# Patient Record
Sex: Female | Born: 1960
Health system: Southern US, Community
[De-identification: ages and names within clinical notes are randomized; demographics above are authoritative.]

## PROBLEM LIST (undated history)

## (undated) DIAGNOSIS — I10 Essential (primary) hypertension: Secondary | ICD-10-CM

## (undated) DIAGNOSIS — M810 Age-related osteoporosis without current pathological fracture: Secondary | ICD-10-CM

## (undated) DIAGNOSIS — T7840XA Allergy, unspecified, initial encounter: Secondary | ICD-10-CM

## (undated) DIAGNOSIS — E871 Hypo-osmolality and hyponatremia: Secondary | ICD-10-CM

## (undated) DIAGNOSIS — R0600 Dyspnea, unspecified: Secondary | ICD-10-CM

## (undated) DIAGNOSIS — I517 Cardiomegaly: Secondary | ICD-10-CM

## (undated) DIAGNOSIS — Z9889 Other specified postprocedural states: Secondary | ICD-10-CM

## (undated) DIAGNOSIS — Z720 Tobacco use: Secondary | ICD-10-CM

## (undated) DIAGNOSIS — R7303 Prediabetes: Secondary | ICD-10-CM

## (undated) DIAGNOSIS — R112 Nausea with vomiting, unspecified: Secondary | ICD-10-CM

## (undated) DIAGNOSIS — M1712 Unilateral primary osteoarthritis, left knee: Secondary | ICD-10-CM

## (undated) DIAGNOSIS — I341 Nonrheumatic mitral (valve) prolapse: Secondary | ICD-10-CM

## (undated) DIAGNOSIS — T8859XA Other complications of anesthesia, initial encounter: Secondary | ICD-10-CM

## (undated) DIAGNOSIS — T4145XA Adverse effect of unspecified anesthetic, initial encounter: Secondary | ICD-10-CM

## (undated) DIAGNOSIS — M1990 Unspecified osteoarthritis, unspecified site: Secondary | ICD-10-CM

## (undated) HISTORY — DX: Allergy, unspecified, initial encounter: T78.40XA

## (undated) HISTORY — DX: Hypo-osmolality and hyponatremia: E87.1

## (undated) HISTORY — DX: Age-related osteoporosis without current pathological fracture: M81.0

## (undated) HISTORY — DX: Cardiomegaly: I51.7

## (undated) HISTORY — DX: Unilateral primary osteoarthritis, left knee: M17.12

## (undated) HISTORY — PX: APPENDECTOMY: SHX54

## (undated) HISTORY — PX: KNEE ARTHROSCOPY: SUR90

## (undated) HISTORY — PX: SHOULDER ARTHROSCOPY: SHX128

---
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
## 1992-12-09 HISTORY — PX: ABDOMINAL HYSTERECTOMY: SHX81

## 2002-02-10 ENCOUNTER — Emergency Department (HOSPITAL_COMMUNITY): Admission: EM | Admit: 2002-02-10 | Discharge: 2002-02-10 | Payer: Self-pay | Admitting: Emergency Medicine

## 2002-02-11 ENCOUNTER — Encounter: Payer: Self-pay | Admitting: Emergency Medicine

## 2002-03-01 ENCOUNTER — Other Ambulatory Visit: Admission: RE | Admit: 2002-03-01 | Discharge: 2002-03-01 | Payer: Self-pay | Admitting: *Deleted

## 2004-06-18 ENCOUNTER — Ambulatory Visit (HOSPITAL_BASED_OUTPATIENT_CLINIC_OR_DEPARTMENT_OTHER): Admission: RE | Admit: 2004-06-18 | Discharge: 2004-06-18 | Payer: Self-pay | Admitting: Orthopedic Surgery

## 2006-10-01 ENCOUNTER — Other Ambulatory Visit: Admission: RE | Admit: 2006-10-01 | Discharge: 2006-10-01 | Payer: Self-pay | Admitting: *Deleted

## 2006-12-09 HISTORY — PX: CERVICAL DISCECTOMY: SHX98

## 2007-03-02 ENCOUNTER — Encounter: Admission: RE | Admit: 2007-03-02 | Discharge: 2007-03-02 | Payer: Self-pay | Admitting: Family Medicine

## 2007-03-24 ENCOUNTER — Inpatient Hospital Stay (HOSPITAL_COMMUNITY): Admission: RE | Admit: 2007-03-24 | Discharge: 2007-03-25 | Payer: Self-pay | Admitting: Orthopedic Surgery

## 2007-03-24 ENCOUNTER — Encounter (INDEPENDENT_AMBULATORY_CARE_PROVIDER_SITE_OTHER): Payer: Self-pay | Admitting: Specialist

## 2008-06-08 ENCOUNTER — Ambulatory Visit (HOSPITAL_BASED_OUTPATIENT_CLINIC_OR_DEPARTMENT_OTHER): Admission: RE | Admit: 2008-06-08 | Discharge: 2008-06-08 | Payer: Self-pay | Admitting: Orthopedic Surgery

## 2010-07-04 ENCOUNTER — Other Ambulatory Visit: Admission: RE | Admit: 2010-07-04 | Discharge: 2010-07-04 | Payer: Self-pay | Admitting: Family Medicine

## 2010-07-20 ENCOUNTER — Encounter: Admission: RE | Admit: 2010-07-20 | Discharge: 2010-07-20 | Payer: Self-pay | Admitting: Family Medicine

## 2011-04-23 NOTE — Op Note (Signed)
NAME:  Jordan [name], SULLENGER NO.:  [id number]   MEDICAL RECORD NO.:  [id number]          PATIENT TYPE:  AMB   LOCATION:  DSC                          FACILITY:  [facility]   PHYSICIAN:  Feliberto Gottron. Turner Daniels, M.D.   DATE OF BIRTH:  09/08/1961   DATE OF PROCEDURE:  06/08/2008  DATE OF DISCHARGE:                               OPERATIVE REPORT   PREOPERATIVE DIAGNOSIS:  Right knee medial meniscal tear versus loose  bodies.   POSTOPERATIVE DIAGNOSES:  Right knee intra-articular loose bodies,  chondromalacia focal grade 4 to the medial femoral condyle, medial  tibial plateau with more generalized grade 3 chondromalacia.   PROCEDURE:  Right knee arthroscopic debridement of chondromalacia and  intra-articular loose bodies.   SURGEON:  Feliberto Gottron.  Turner Daniels, MD   FIRST ASSISTANT:  Shirl Harris, PA.   ANESTHETIC:  General LMA.   ESTIMATED BLOOD LOSS:  Minimal.   FLUID REPLACEMENT:  800 mL of crystalloid.   DRAINS PLACED:  None.   TOURNIQUET TIME:  None.   INDICATIONS FOR PROCEDURE:  A 50 year old woman who has had previous  arthroscopy of her right knee by me in 2007, with recurrent catching,  popping and pain.  In the past, she has not done well with cortisone  injections.  She did desire another one.  Plain radiographs show good  maintenance of articular cartilage height and she does have mechanical  catching and popping on physical examination.  She desires elective  arthroscopic evaluation and treatment of her right knee with a presumed  diagnosis of recurrent chondromalacia, possible loose bodies, possible  medial meniscal tear.  She says the pain on this presentation was worse  than it was 2 years ago.  Risks and benefits of surgery discussed,  questions answered.   DESCRIPTION OF PROCEDURE:  The patient identified by armband, taken to  the operating room at [facility].  Appropriate anesthetic  monitors were attached and general LMA anesthesia induced with  the  patient in supine position.  Lateral post applied to the table and the  right lower extremity prepped and draped in usual sterile fashion from  the ankle to the midthigh.  We began the procedure by making standard  inferomedial, and inferolateral peripatellar portals allowing  introduction of the arthroscope through the inferolateral portal and the  outflow through the inferomedial portal.  Diagnostic arthroscopy  revealed a normal patellofemoral joint, but there were multiple  cartilaginous loose body seen floating around in the joint fluid that  were taken through the outflow.  Moving to the medial side of the joint  we identified an area of grade 3 to grade 4, focal chondromalacia of the  medial femoral condyle distally as well as the medial tibial plateau  near the mid meniscal inner rim.  Both these areas were about 2 mm in  width and 5-6 mm in length.  These were debrided back to a stable  margin.  The medial meniscus was probed and found to be intact.  The  cruciate ligaments were intact.  On the lateral side the articular and  meniscal cartilages  were in good shape except for some very shallow  cracks in the lateral tibial plateau that were not fibrillated.  These  were left alone.  The gutters were cleared medially and laterally and  there a few more small loose bodies noted.  These were taken through the  outflow.  At this point, the knee was irrigated out with normal saline  solution.  The arthroscopic consents removed and a dressing of Xeroform,  4x4 dressing, sponges, Webril and Ace wrap applied.      Feliberto Gottron. Turner Daniels, M.D.  Electronically Signed     FJR/MEDQ  D:  06/08/2008  T:  06/09/2008  Job:  [id number]

## 2011-04-26 NOTE — Discharge Summary (Signed)
NAME:  Jordan [name], Jordan [name] NO.:  [id number]   MEDICAL RECORD NO.:  [id number]          PATIENT TYPE:  INP   LOCATION:  5006                         FACILITY:  [facility]   PHYSICIAN:  Nelda Severe, MD      DATE OF BIRTH:  06-20-1961   DATE OF ADMISSION:  03/24/2007  DATE OF DISCHARGE:  03/25/2007                               DISCHARGE SUMMARY   This woman was admitted for management of severe right shoulder pain and  right brachialgia secondary to a right C5-6 disk herniation.  The  patient was taken to the operating room where anterior disk excision and  fusion was carried out without complication.   Today, the first postoperative day, she is free of arm pain.  She still  has some tingling in the thumb and index finger of the right hand which  I have reassured her is fairly usual.  Penrose drain was removed from  the wound this morning and the incision was satisfactory.  She is having  a little pain anteriorly at crest harvest site on the left side which is  slowing up her walking.  She has been provided with an Aspen collar this  morning and I have shown her how to place it with her husband.   She is being given a prescription for Norco 10, 75 tablets, one q.4h.  p.r.n. and she has some Percocet remaining at home, given to her by  another doctor.  She will also continue to take Citracal and a  multivitamin.   She may remove her dressings and shower beginning tomorrow.  She is to  avoid lifting and bending.  She is to remain on a soft diet for at least  1 week.  She will wear her Aspen collar when she is out of the house.  She will return to see me in the office in 2 week's time.   FINAL DIAGNOSIS:  C5-6 disk herniation.   See above for discharge instructions, medication and followup  arrangements.      Nelda Severe, MD  Electronically Signed     MT/MEDQ  D:  03/25/2007  T:  03/25/2007  Job:  [id number]

## 2011-04-26 NOTE — H&P (Signed)
NAME:  Jordan [name], Jordan [name] NO.:  [id number]   MEDICAL RECORD NO.:  [id number]          PATIENT TYPE:  INP   LOCATION:  NA                           FACILITY:  [facility]   PHYSICIAN:  Nelda Severe, MD      DATE OF BIRTH:  1961/04/16   DATE OF ADMISSION:  DATE OF DISCHARGE:                              HISTORY & PHYSICAL   CHIEF COMPLAINT:  Constant shoulder pain, arm pain on the right with  right-sided numbness and tingling, as well as cervical neck pain in the  area of C5-6 secondary to herniated disk.   DRUG ALLERGIES:  Include NAPROSYN and GUAIFENESIN.   MEDICATIONS:  Today include:  1. Oxycodone.  2. Percocet 5/325 one every 4 to 6 p.r.n. for pain control.   PAST SURGICAL HISTORY:  Includes:  1. Right knee arthroscopy in 2007.  2. Left shoulder arthroscopy in 2003.  3. Hysterectomy in 1989.  4. Appendectomy in 1978.   FAMILY HISTORY:  Includes:  1. Coronary artery disease.  2. Diabetes.   REVIEW OF SYSTEMS:  She does not report change in her vision or hearing.  No chronic cough.  No shortness of breath.  No fever, no chills.  No  chest pain.  No nausea, vomiting, diarrhea.  No bowel or bladder  incontinence.  No abnormal gait.  No headaches or seizures.  No  depression.   PHYSICAL EXAMINATION:  VITAL SIGNS:  Today, her temperature is 97.3,  pulse is 102, respirations 22, blood pressure 137/96.  She is well-  developed, well-nourished.  Normocephalic in appearance.  Pupils are  equal, round, react to light.  NECK:  Supple.  No adenopathy.  She does have pain with range of motion.  She has positive foraminal compression test on the right.  CHEST:  Clear to auscultation.  No wheezes noted.  HEART:  Regular rate and rhythm.  No murmur noted.  ABDOMEN:  Soft, nontender to palpation.  Positive bowel sounds  auscultated.  UPPER EXTREMITY EXAM:  She has a positive foraminal compression test on  the right.  She has decreased sensation and numbness and tingling on  the  right upper extremity.  Depressed reflexes, right biceps 1/4 compared to  2/4 on the left.  SKIN:  Clean, dry, and intact.   MRI was reviewed.  It shows a C5-6 herniated disk to the right.   PLAN:  Anterior fusion C5-6 with iliac crest bone graft by Dr. Nelda Severe.      Lianne Cure, P.A.      Nelda Severe, MD  Electronically Signed    MC/MEDQ  D:  03/20/2007  T:  03/20/2007  Job:  [id number]

## 2011-04-26 NOTE — Op Note (Signed)
NAME:  Jordan, [name] NO.:  [id number]   MEDICAL RECORD NO.:  [id number]          PATIENT TYPE:  INP   LOCATION:  2899                         FACILITY:  [facility]   PHYSICIAN:  Nelda Severe, MD      DATE OF BIRTH:  12/02/1961   DATE OF PROCEDURE:  03/24/2007  DATE OF DISCHARGE:                               OPERATIVE REPORT   SURGEON:  Dr. Nelda Severe.   ASSISTANT:  Lianne Cure, PA-C   PREOPERATIVE DIAGNOSIS:  C5-6 foraminal stenosis and disk herniation  right side.   POSTOPERATIVE DIAGNOSIS:  C5-6 foraminal stenosis and disk herniation  right side.   OPERATIVE PROCEDURE:  Anterior C5-6 disk excision, foraminotomy and  fusion with left iliac crest autograft, interbody cage (Synthes)  anterior cervical plate (Synthes).   INDICATIONS FOR SURGERY:  Severe persistent right brachialgia  unresponsive to nonoperative measures.   PROCEDURE:  The patient was placed under general endotracheal  anesthesia.  Antibiotics were administered intravenously  prophylactically preop.  She was positioned supine on operating table  with head supported on a horseshoe.  A small foam bump was placed under  the left hip to elevate the anterior iliac crest.  Neck was relatively  extended.  The arms were tucked at the sides.  Prior to prepping, a  cross table lateral x-ray of the neck was taken with a 18 gauge needle  taped to the skin and its location marked with skin marker.  The  radiograph revealed that the needle was at approximately the C4-5 level.  It was removed.  The skin of the anterior cervical spine and left  anterior iliac crest were prepped with DuraPrep.  The operative sites,  anterior cervical spine and left iliac crest were then draped in  rectangular fashion and the drapes secured with Ioban.   A transverse incision, from midline left to the anterior border the  sternocleidomastoid was made about 1 cm below the previously placed  needle.  The subcutaneous  tissue was injected with a mixture of quarter  percent Marcaine with epinephrine 1% lidocaine after scoring the skin  and the line of the incision.  Incision was then carried down through  the platysmas which was cut in line with the incision.   We then carried out blunt dissection the deep cervical fascia,  retracting the carotid sheath laterally, the trachea and esophagus  medial to the right side.  Longus coli muscles were identified.  What  was perceived to be the C4-5 disk, a little proximal to the central part  of our wound was identified and a radiopaque needle marker placed there.  It was placed above the C5-6 disk to make sure that it showed up on the  x-ray.  The next x-ray confirmed our level and the C5-6 at disk space  was accessed.  We mobilized longus, coli muscles bilaterally at the  level of the bodies of C5-6 and the disk space.  A self-retaining shadow  line retractor was placed.  We then placed distraction pins at C5 and C6  and applied the distractor.   The annulus was fenestrated  and then curettes used to free up most of  the nucleus.  This was then delivered using pituitary rongeurs.  The  distraction was increased slightly.  Were then able to access the  posterior aspect of the disk space curetting away annular fibers and  nucleus as well as cervical endplate cartilage.   At the posterolateral area on the right side there was a gossamer thin  layer of what was presumably annulus separating the internal part of the  disk from a small sac extruded fragment of disk which was delivered into  the wound.  We performed a thorough foraminotomy on the right side.   We then removed as much more nucleus as we could from the lateral  portions of the disk.  We meticulously removed more endplate cartilage.  Small amount of anterior spondylophyte was removed using a rongeur.   We then trialed for appropriate size of interbody cage.  I placed a 6 mm  thick cage and let off the  distraction the cage seemed a little loose.  The 7 mm cage seemed a better fit when the distraction was let off.   We then harvested a left anterior iliac crest graft through a short  incision made about 1 cm distal to the iliac crest on the left side.  The external oblique fascia was then elevated off the top of the crest  over about a 2 cm segment.  A small gouge was then used to fenestrate  the top of the crest and harvest an adequate amount of cancellous graft  packed into the cage.  The cage was packed and then inserted with the  vertebra under distraction and then the distraction let off.  The cage  was impacted about 1-2 mm further.   Next we packed in some remaining graft lateral to the cage and  anteriorly, between the cage and the overlying plate.  Cross-table  lateral radiographs were taken showing satisfactory position of screws,  plate and cage.   Care was taken to make sure that all screws were seated in the plate.  These were 14 mm screws.   We then placed a quarter inch Penrose drain in the depths of the wound.  The platysma was closed using interrupted inverted 2-0 Vicryl sutures.  The skin layer was then closed using 3-0 undyed Vicryl in running  subcuticular fashion.  The quarter inch Penrose drain was secured using  one 4-0 nylon suture.   The fascial of the iliac crest harvest site was closed using 0-0 Vicryl  suture.  The subcutaneous tissue was closed using 2-0 Vicryl and the  skin using a running subcuticular 3-0 undyed Vicryl stitch.  Both  incisions were reinforced with Steri-Strips after applying tincture of  Benzoin to the skin.  A antibiotic ointment dressing was applied to both  wounds.  The dressing on the iliac crest was secured with OpSite.  The  dressing on the cervical spine was secured with a surgical towel.   The patient was then awakened and extubated in the operating room.  She was able to move all four extremities in the recovery room, has  good  strength grip on both sides.  She is not complaining of any arm or right  shoulder pain.   Sponge, needle counts were correct.  There were no intraoperative  complications.  Final x-ray showed confirmed proper level and screw  placements etc.      Nelda Severe, MD  Electronically Signed  MT/MEDQ  D:  03/24/2007  T:  03/24/2007  Job:  [id number]

## 2011-04-26 NOTE — Op Note (Signed)
NAME:  Jordan [name], Jordan [name] NO.:  [id number]   MEDICAL RECORD NO.:  [id number]                   PATIENT TYPE:  AMB   LOCATION:  NESC                                 FACILITY:  [facility]   PHYSICIAN:  Feliberto Gottron. Turner Daniels, M.D.                DATE OF BIRTH:  1961-07-23   DATE OF PROCEDURE:  06/18/2004  DATE OF DISCHARGE:                                 OPERATIVE REPORT   PREOPERATIVE DIAGNOSIS:  Left shoulder impingement syndrome.   POSTOPERATIVE DIAGNOSIS:  Left shoulder impingement syndrome.   PROCEDURE:  Left shoulder arthroscopic anterior and inferior acromioplasty.   SURGEON:  Feliberto Gottron. Turner Daniels, M.D.   FIRST ASSISTANT:  Erskine Squibb B. Jannet Mantis.   ANESTHESIA:  General endotracheal.   ESTIMATED BLOOD LOSS:  Minimal.   FLUID REPLACEMENT:  One liter Crystalloid.   DRAINS:  None.   TOURNIQUET TIME:  None.   INDICATIONS FOR PROCEDURE:  A 50 year old woman treated for left shoulder  impingement syndrome for over a year.  Got good temporary relief from a  cortisone injection last year, but it wore off.  She desires to not have any  further cortisone injections.  She has a 6-7 mm type 2 subacromial spur and  classic impingement signs.  She has also failed physical therapy and oral  anti-inflammatory medications.   DESCRIPTION OF PROCEDURE:  The patient identified by arm band and taken to  the operating room at the [facility] outpatient surgery center.  Appropriate anesthetic monitors were attached.  General endotracheal  anesthesia induced, and the patient was then placed in the beach chair  position.  The left upper extremity prepped and draped in the usual sterile  fashion from the wrist to the hemithorax.   The skin along the anterolateral and posterior aspects of the acromion  process was infiltrated with 2-3 cc of 0.5% Marcaine and epinephrine  solution.  Using a  #11 blade, the standard portals were made, 1.5 cm anterior to the [facility] joint  lateral to the  junction of the middle and posterior insert of the acromion,  and posterior to the posterolateral corner of the acromion process.  The  inflow was placed anteriorly, the arthroscope placed laterally, and a 4.2  great white sucker shaver posteriorly -- allowing removal of the inflamed  subacromial bursa, part of the CA ligament insertion and outlining of the  subacromial spur.  This was then removed with a 4.5 hooded vortex bur).  We  evaluated the rotator cuff and found no significant tearing. The arthroscope  was repositioned into the glenohumeral joint itself, using the posterior  portal with diagnostic arthroscopy -- once again, revealed no significant  tears to the rotator cuff.  There is definitely inflammation below where the  spur resided, prior to its resection.  The biceps tendon and labrum were in  good condition, as was the articular cartilage.   The shoulder was then washed  out with normal saline solution.  The  arthroscopic instrument were removed.  A dressing of Xeroform 4 x 4 dressing  sponges, paper tape and a sling applied.  The patient was then laid supine,  awakened and taken to the recovery room without difficulty.                                               Feliberto Gottron. Turner Daniels, M.D.    Ovid Curd  D:  06/18/2004  T:  06/18/2004  Job:  [id number]

## 2011-07-08 ENCOUNTER — Other Ambulatory Visit: Payer: Self-pay | Admitting: Internal Medicine

## 2011-07-08 ENCOUNTER — Other Ambulatory Visit: Payer: Self-pay | Admitting: Family Medicine

## 2011-07-08 DIAGNOSIS — Z1231 Encounter for screening mammogram for malignant neoplasm of breast: Secondary | ICD-10-CM

## 2011-07-26 ENCOUNTER — Ambulatory Visit
Admission: RE | Admit: 2011-07-26 | Discharge: 2011-07-26 | Disposition: A | Discharge status: Home / Self Care | Payer: Federal, State, Local not specified - PPO | Source: Ambulatory Visit | Attending: Family Medicine | Admitting: Family Medicine

## 2011-07-26 DIAGNOSIS — Z1231 Encounter for screening mammogram for malignant neoplasm of breast: Secondary | ICD-10-CM

## 2011-09-05 LAB — POCT HEMOGLOBIN-HEMACUE: Hemoglobin: 14.5

## 2012-10-27 ENCOUNTER — Other Ambulatory Visit: Payer: Self-pay | Admitting: Gastroenterology

## 2013-08-13 ENCOUNTER — Other Ambulatory Visit: Payer: Self-pay | Admitting: Orthopedic Surgery

## 2013-08-17 ENCOUNTER — Encounter (HOSPITAL_COMMUNITY): Payer: Self-pay

## 2013-08-20 ENCOUNTER — Encounter (HOSPITAL_COMMUNITY)
Admission: RE | Admit: 2013-08-20 | Discharge: 2013-08-20 | Disposition: A | Discharge status: Home / Self Care | Payer: Federal, State, Local not specified - PPO | Source: Ambulatory Visit | Attending: Orthopedic Surgery | Admitting: Orthopedic Surgery

## 2013-08-20 ENCOUNTER — Ambulatory Visit (HOSPITAL_COMMUNITY)
Admission: RE | Admit: 2013-08-20 | Discharge: 2013-08-20 | Disposition: A | Discharge status: Home / Self Care | Payer: Federal, State, Local not specified - PPO | Source: Ambulatory Visit | Attending: Orthopedic Surgery | Admitting: Orthopedic Surgery

## 2013-08-20 ENCOUNTER — Encounter (HOSPITAL_COMMUNITY): Payer: Self-pay

## 2013-08-20 DIAGNOSIS — I10 Essential (primary) hypertension: Secondary | ICD-10-CM | POA: Insufficient documentation

## 2013-08-20 DIAGNOSIS — M1710 Unilateral primary osteoarthritis, unspecified knee: Secondary | ICD-10-CM | POA: Insufficient documentation

## 2013-08-20 DIAGNOSIS — Z01818 Encounter for other preprocedural examination: Secondary | ICD-10-CM | POA: Insufficient documentation

## 2013-08-20 DIAGNOSIS — Z01810 Encounter for preprocedural cardiovascular examination: Secondary | ICD-10-CM | POA: Insufficient documentation

## 2013-08-20 DIAGNOSIS — Z01812 Encounter for preprocedural laboratory examination: Secondary | ICD-10-CM | POA: Insufficient documentation

## 2013-08-20 HISTORY — DX: Unspecified osteoarthritis, unspecified site: M19.90

## 2013-08-20 HISTORY — DX: Nonrheumatic mitral (valve) prolapse: I34.1

## 2013-08-20 HISTORY — DX: Essential (primary) hypertension: I10

## 2013-08-20 LAB — CBC WITH DIFFERENTIAL/PLATELET
Basophils Absolute: 0.1 10*3/uL (ref 0.0–0.1)
Basophils Relative: 1 % (ref 0–1)
Eosinophils Absolute: 0.2 10*3/uL (ref 0.0–0.7)
Eosinophils Relative: 3 % (ref 0–5)
HCT: 42.3 % (ref 36.0–46.0)
Hemoglobin: 14.7 g/dL (ref 12.0–15.0)
Lymphocytes Relative: 20 % (ref 12–46)
Lymphs Abs: 1.6 10*3/uL (ref 0.7–4.0)
MCH: 32.8 pg (ref 26.0–34.0)
MCHC: 34.8 g/dL (ref 30.0–36.0)
MCV: 94.4 fL (ref 78.0–100.0)
Monocytes Absolute: 0.8 10*3/uL (ref 0.1–1.0)
Monocytes Relative: 9 % (ref 3–12)
Neutro Abs: 5.4 10*3/uL (ref 1.7–7.7)
Neutrophils Relative %: 67 % (ref 43–77)
Platelets: 266 10*3/uL (ref 150–400)
RBC: 4.48 MIL/uL (ref 3.87–5.11)
RDW: 12.8 % (ref 11.5–15.5)
WBC: 8 10*3/uL (ref 4.0–10.5)

## 2013-08-20 LAB — TYPE AND SCREEN
ABO/RH(D): O POS
Antibody Screen: NEGATIVE

## 2013-08-20 LAB — URINALYSIS, ROUTINE W REFLEX MICROSCOPIC
Bilirubin Urine: NEGATIVE
Glucose, UA: NEGATIVE mg/dL
Ketones, ur: NEGATIVE mg/dL
Leukocytes, UA: NEGATIVE
Nitrite: NEGATIVE
Protein, ur: NEGATIVE mg/dL
Specific Gravity, Urine: 1.015 (ref 1.005–1.030)
Urobilinogen, UA: 0.2 mg/dL (ref 0.0–1.0)
pH: 7 (ref 5.0–8.0)

## 2013-08-20 LAB — BASIC METABOLIC PANEL
BUN: 18 mg/dL (ref 6–23)
CO2: 22 mEq/L (ref 19–32)
Calcium: 10.3 mg/dL (ref 8.4–10.5)
Chloride: 101 mEq/L (ref 96–112)
Creatinine, Ser: 0.85 mg/dL (ref 0.50–1.10)
GFR calc Af Amer: 90 mL/min — ABNORMAL LOW (ref >90)
GFR calc non Af Amer: 77 mL/min — ABNORMAL LOW (ref >90)
Glucose, Bld: 89 mg/dL (ref 70–99)
Potassium: 4.8 mEq/L (ref 3.5–5.1)
Sodium: 136 mEq/L (ref 135–145)

## 2013-08-20 LAB — URINE MICROSCOPIC-ADD ON

## 2013-08-20 LAB — SURGICAL PCR SCREEN
MRSA, PCR: NEGATIVE
Staphylococcus aureus: NEGATIVE

## 2013-08-20 LAB — PROTIME-INR
INR: 0.89 (ref 0.00–1.49)
Prothrombin Time: 11.9 seconds (ref 11.6–15.2)

## 2013-08-20 LAB — APTT: aPTT: 28 seconds (ref 24–37)

## 2013-08-20 NOTE — Pre-Procedure Instructions (Addendum)
JANNELLE NOTARO  08/20/2013   Your procedure is scheduled on:08/25/13  Report to [facility] at 1045 AM.  Call this number if you have problems the morning of surgery: [phone]   Remember:   Do not eat food or drink liquids after midnight.   Take these medicines the morning of surgery with A SIP OF WATER:amlodipine, pain med if needed, effexor, chantix             STOP fish oil now   Do not wear jewelry, make-up or nail polish.  Do not wear lotions, powders, or perfumes. You may wear deodorant.  Do not shave 48 hours prior to surgery. Men may shave face and neck.  Do not bring valuables to the hospital.  [facility] is not responsible                   for any belongings or valuables.  Contacts, dentures or bridgework may not be worn into surgery.  Leave suitcase in the car. After surgery it may be brought to your room.  For patients admitted to the hospital, checkout time is 11:00 AM the day of  discharge.   Patients discharged the day of surgery will not be allowed to drive  home.  Name and phone number of your driver:   Special Instructions: Incentive Spirometry - Practice and bring it with you on the day of surgery. Shower using CHG 2 nights before surgery and the night before surgery.  If you shower the day of surgery use CHG.  Use special wash - you have one bottle of CHG for all showers.  You should use approximately 1/3 of the bottle for each shower.   Please read over the following fact sheets that you were given: Pain Booklet, Coughing and Deep Breathing, Blood Transfusion Information, MRSA Information and Surgical Site Infection Prevention

## 2013-08-20 NOTE — Progress Notes (Signed)
req'd office notes ,ekg from [employer] [facility] [place]

## 2013-08-23 NOTE — H&P (Signed)
TOTAL KNEE ADMISSION H&P  Patient is being admitted for right total knee arthroplasty.  Subjective:  Chief Complaint:right knee pain.  HPI: Jordan [name], 52 y.o. female, has a history of pain and functional disability in the right knee due to arthritis and has failed non-surgical conservative treatments for greater than 12 weeks to includeNSAID's and/or analgesics, corticosteriod injections, flexibility and strengthening excercises and activity modification.  Onset of symptoms was gradual, starting 3 years ago with rapidlly worsening course since that time. The patient noted prior procedures on the knee to include  arthroscopy on the right knee(s).  Patient currently rates pain in the right knee(s) at 10 out of 10 with activity. Patient has night pain, worsening of pain with activity and weight bearing, pain that interferes with activities of daily living, pain with passive range of motion, crepitus and joint swelling.  Patient has evidence of periarticular osteophytes and joint space narrowing by imaging studies.  There is no active infection.  There are no active problems to display for this patient.  Past Medical History  Diagnosis Date  . MVP (mitral valve prolapse)   . Hypertension   . Arthritis     Past Surgical History  Procedure Laterality Date  . Cervical discectomy  08  . Abdominal hysterectomy  94  . Shoulder arthroscopy Left     bone spur  . Knee arthroscopy Right 09,and ?  Marland Kitchen Appendectomy      No prescriptions prior to admission   Allergies  Allergen Reactions  . Guaifenesin & Derivatives Hives  . Naprosyn [Naproxen] Hives    History  Substance Use Topics  . Smoking status: Former Smoker -- 1.00 packs/day for 7 years    Types: Cigarettes    Quit date: 08/20/2013  . Smokeless tobacco: Not on file  . Alcohol Use: 0.5 oz/week    1 drink(s) per week    No family history on file.   Review of Systems  Constitutional: Negative.   HENT: Negative.   Eyes:  Negative.   Respiratory: Negative.   Cardiovascular: Negative.   Gastrointestinal: Negative.   Genitourinary: Negative.   Musculoskeletal: Positive for joint pain (bilateral knee pain).  Skin: Negative.   Neurological: Negative.   Endo/Heme/Allergies: Negative.   Psychiatric/Behavioral: Negative.     Objective:  Physical Exam  Constitutional: She is oriented to person, place, and time. She appears well-developed and well-nourished.  HENT:  Head: Normocephalic and atraumatic.  Eyes: EOM are normal. Pupils are equal, round, and reactive to light.  Neck: Normal range of motion. Neck supple.  Cardiovascular: Intact distal pulses.   Respiratory: Effort normal and breath sounds normal.  Musculoskeletal: She exhibits tenderness (bilateral knee).  Neurological: She is alert and oriented to person, place, and time. She has normal reflexes.  Skin: Skin is warm and dry.  Psychiatric: She has a normal mood and affect. Her behavior is normal. Judgment and thought content normal.    Vital signs in last 24 hours:    Labs:   There is no height or weight on file to calculate BMI.   Imaging Review AP, Rosenberg, lateral and sunrise x-rays of the right knee show bone-on-bone arthritis to the medial compartment of the right knee on the [place] view 1-2 mm of cartilage remaining on the left side.  There is early lateral subluxation of the tibia beneath the femur bilaterally and some  subchondral cysts medially on the medial femoral condyle.  Assessment/Plan:  End stage arthritis, right knee   The patient  history, physical examination, clinical judgment of the provider and imaging studies are consistent with end stage degenerative joint disease of the right knee(s) and total knee arthroplasty is deemed medically necessary. The treatment options including medical management, injection therapy arthroscopy and arthroplasty were discussed at length. The risks and benefits of total knee  arthroplasty were presented and reviewed. The risks due to aseptic loosening, infection, stiffness, patella tracking problems, thromboembolic complications and other imponderables were discussed. The patient acknowledged the explanation, agreed to proceed with the plan and consent was signed. Patient is being admitted for inpatient treatment for surgery, pain control, PT, OT, prophylactic antibiotics, VTE prophylaxis, progressive ambulation and ADL's and discharge planning. The patient is planning to be discharged home with home health services

## 2013-08-24 MED ORDER — CEFAZOLIN SODIUM-DEXTROSE 2-3 GM-% IV SOLR
2.0000 g | INTRAVENOUS | Status: AC
  Administered 2013-08-25: 2 g via INTRAVENOUS
  Filled 2013-08-24: qty 50

## 2013-08-24 NOTE — Progress Notes (Signed)
Anesthesia chart review: Patient 52 year old female scheduled for right knee arthroplasty on 08/25/2013 by Dr. Turner Daniels.  History includes MVP, HTN, arthritis, former smoker.  Vitals noted--I spoke with the nurse tech Earlene Plater today who reports that all 08/20/13 PATpatient's had O2 sats 90% or greater at PAT, so SpO2 "79%" is believed to be an erroneous entry. Vitals and SpO2 will be rechecked on arrival.  EKG on 08/20/13 showed NSR, anterior infarct (age undetermined). Possible anterior infarct changes were also noted on a prior EKG from 03/19/07 (see Muse).  Preoperative CXR and labs noted.  She will be further evaluated by her assigned anesthesiologist on the day of surgery.  If no new CV symptoms then I would anticipate that she could proceed as planned.  Velna Ochs [facility] Short Stay Center/Anesthesiology Phone [phone] 08/24/2013 11:20 AM

## 2013-08-25 ENCOUNTER — Inpatient Hospital Stay (HOSPITAL_COMMUNITY)
Admission: RE | Admit: 2013-08-25 | Discharge: 2013-08-29 | DRG: 209 | Disposition: A | Discharge status: Home Health | Payer: Federal, State, Local not specified - PPO | Source: Ambulatory Visit | Attending: Orthopedic Surgery | Admitting: Orthopedic Surgery

## 2013-08-25 ENCOUNTER — Encounter (HOSPITAL_COMMUNITY): Payer: Self-pay | Admitting: Vascular Surgery

## 2013-08-25 ENCOUNTER — Ambulatory Visit (HOSPITAL_COMMUNITY): Payer: Federal, State, Local not specified - PPO | Admitting: Certified Registered Nurse Anesthetist

## 2013-08-25 ENCOUNTER — Encounter (HOSPITAL_COMMUNITY): Payer: Self-pay | Admitting: *Deleted

## 2013-08-25 ENCOUNTER — Encounter (HOSPITAL_COMMUNITY)
Admission: RE | Disposition: A | Discharge status: Home Health | Payer: Self-pay | Source: Ambulatory Visit | Attending: Orthopedic Surgery

## 2013-08-25 DIAGNOSIS — Z9089 Acquired absence of other organs: Secondary | ICD-10-CM

## 2013-08-25 DIAGNOSIS — M1712 Unilateral primary osteoarthritis, left knee: Secondary | ICD-10-CM | POA: Diagnosis present

## 2013-08-25 DIAGNOSIS — Z981 Arthrodesis status: Secondary | ICD-10-CM

## 2013-08-25 DIAGNOSIS — Z96659 Presence of unspecified artificial knee joint: Secondary | ICD-10-CM

## 2013-08-25 DIAGNOSIS — I10 Essential (primary) hypertension: Secondary | ICD-10-CM | POA: Diagnosis present

## 2013-08-25 DIAGNOSIS — M1710 Unilateral primary osteoarthritis, unspecified knee: Principal | ICD-10-CM | POA: Diagnosis present

## 2013-08-25 DIAGNOSIS — Z87891 Personal history of nicotine dependence: Secondary | ICD-10-CM

## 2013-08-25 DIAGNOSIS — E871 Hypo-osmolality and hyponatremia: Secondary | ICD-10-CM | POA: Diagnosis present

## 2013-08-25 DIAGNOSIS — Z79899 Other long term (current) drug therapy: Secondary | ICD-10-CM

## 2013-08-25 DIAGNOSIS — Z7982 Long term (current) use of aspirin: Secondary | ICD-10-CM

## 2013-08-25 HISTORY — DX: Unilateral primary osteoarthritis, left knee: M17.12

## 2013-08-25 HISTORY — PX: TOTAL KNEE ARTHROPLASTY: SHX125

## 2013-08-25 SURGERY — ARTHROPLASTY, KNEE, TOTAL
Anesthesia: General | Laterality: Right

## 2013-08-25 MED ORDER — HYDROMORPHONE HCL PF 1 MG/ML IJ SOLN
1.0000 mg | INTRAMUSCULAR | Status: DC | PRN
  Administered 2013-08-25 – 2013-08-26 (×5): 1 mg via INTRAVENOUS
  Filled 2013-08-25 (×5): qty 1

## 2013-08-25 MED ORDER — KCL IN DEXTROSE-NACL 20-5-0.45 MEQ/L-%-% IV SOLN
INTRAVENOUS | Status: DC
  Administered 2013-08-25: 21:00:00 via INTRAVENOUS
  Filled 2013-08-25 (×7): qty 1000

## 2013-08-25 MED ORDER — LISINOPRIL-HYDROCHLOROTHIAZIDE 20-25 MG PO TABS: 1.0000 | ORAL_TABLET | Freq: Every day | ORAL | Status: DC

## 2013-08-25 MED ORDER — FENTANYL CITRATE 0.05 MG/ML IJ SOLN
INTRAMUSCULAR | Status: DC | PRN
  Administered 2013-08-25 (×10): 50 ug via INTRAVENOUS

## 2013-08-25 MED ORDER — FENTANYL CITRATE 0.05 MG/ML IJ SOLN
INTRAMUSCULAR | Status: AC
  Administered 2013-08-25: 100 ug
  Filled 2013-08-25: qty 2

## 2013-08-25 MED ORDER — METHOCARBAMOL 100 MG/ML IJ SOLN: 500.0000 mg | Freq: Four times a day (QID) | INTRAVENOUS | Status: DC | PRN

## 2013-08-25 MED ORDER — ONDANSETRON HCL 4 MG/2ML IJ SOLN
4.0000 mg | Freq: Four times a day (QID) | INTRAMUSCULAR | Status: DC | PRN
  Administered 2013-08-26 (×2): 4 mg via INTRAVENOUS
  Filled 2013-08-25 (×2): qty 2

## 2013-08-25 MED ORDER — ALUM & MAG HYDROXIDE-SIMETH 200-200-20 MG/5ML PO SUSP: 30.0000 mL | ORAL | Status: DC | PRN

## 2013-08-25 MED ORDER — SENNOSIDES-DOCUSATE SODIUM 8.6-50 MG PO TABS
1.0000 | ORAL_TABLET | Freq: Every evening | ORAL | Status: DC | PRN
  Administered 2013-08-27: 1 via ORAL
  Filled 2013-08-25: qty 1

## 2013-08-25 MED ORDER — CHLORHEXIDINE GLUCONATE 4 % EX LIQD: 60.0000 mL | Freq: Once | CUTANEOUS | Status: DC

## 2013-08-25 MED ORDER — MIDAZOLAM HCL 2 MG/2ML IJ SOLN
INTRAMUSCULAR | Status: AC
  Administered 2013-08-25: 0.5 mg
  Filled 2013-08-25: qty 2

## 2013-08-25 MED ORDER — METHOCARBAMOL 500 MG PO TABS: 500.0000 mg | ORAL_TABLET | Freq: Two times a day (BID) | ORAL | Status: DC

## 2013-08-25 MED ORDER — PHENOL 1.4 % MT LIQD: 1.0000 | OROMUCOSAL | Status: DC | PRN

## 2013-08-25 MED ORDER — LISINOPRIL 20 MG PO TABS
20.0000 mg | ORAL_TABLET | Freq: Every day | ORAL | Status: DC
  Administered 2013-08-25 – 2013-08-29 (×3): 20 mg via ORAL
  Filled 2013-08-25 (×6): qty 1

## 2013-08-25 MED ORDER — VARENICLINE TARTRATE 0.5 MG PO TABS
0.5000 mg | ORAL_TABLET | Freq: Every day | ORAL | Status: DC
  Administered 2013-08-26 – 2013-08-29 (×4): 0.5 mg via ORAL
  Filled 2013-08-25 (×5): qty 1

## 2013-08-25 MED ORDER — OXYCODONE HCL 5 MG PO TABS
5.0000 mg | ORAL_TABLET | ORAL | Status: DC | PRN
  Administered 2013-08-25 – 2013-08-27 (×6): 10 mg via ORAL
  Administered 2013-08-27: 5 mg via ORAL
  Administered 2013-08-28 – 2013-08-29 (×9): 10 mg via ORAL
  Filled 2013-08-25 (×14): qty 2
  Filled 2013-08-25: qty 1
  Filled 2013-08-25: qty 2

## 2013-08-25 MED ORDER — MIDAZOLAM HCL 5 MG/5ML IJ SOLN
INTRAMUSCULAR | Status: DC | PRN
  Administered 2013-08-25: 2 mg via INTRAVENOUS

## 2013-08-25 MED ORDER — LACTATED RINGERS IV SOLN
INTRAVENOUS | Status: DC
  Administered 2013-08-25: 11:00:00 via INTRAVENOUS

## 2013-08-25 MED ORDER — METHOCARBAMOL 500 MG PO TABS
500.0000 mg | ORAL_TABLET | Freq: Four times a day (QID) | ORAL | Status: DC | PRN
  Administered 2013-08-25 – 2013-08-29 (×2): 500 mg via ORAL
  Filled 2013-08-25 (×2): qty 1

## 2013-08-25 MED ORDER — HYDROMORPHONE HCL PF 1 MG/ML IJ SOLN
INTRAMUSCULAR | Status: AC
  Filled 2013-08-25: qty 1

## 2013-08-25 MED ORDER — HYDROMORPHONE HCL PF 1 MG/ML IJ SOLN: 0.5000 mg | INTRAMUSCULAR | Status: DC | PRN

## 2013-08-25 MED ORDER — ACETAMINOPHEN 650 MG RE SUPP: 650.0000 mg | Freq: Four times a day (QID) | RECTAL | Status: DC | PRN

## 2013-08-25 MED ORDER — AMLODIPINE BESYLATE 2.5 MG PO TABS
2.5000 mg | ORAL_TABLET | Freq: Every day | ORAL | Status: DC
  Administered 2013-08-25 – 2013-08-29 (×3): 2.5 mg via ORAL
  Filled 2013-08-25 (×6): qty 1

## 2013-08-25 MED ORDER — LIDOCAINE HCL (CARDIAC) 20 MG/ML IV SOLN
INTRAVENOUS | Status: DC | PRN
  Administered 2013-08-25: 80 mg via INTRAVENOUS

## 2013-08-25 MED ORDER — ONDANSETRON HCL 4 MG/2ML IJ SOLN
INTRAMUSCULAR | Status: DC | PRN
  Administered 2013-08-25: 4 mg via INTRAVENOUS

## 2013-08-25 MED ORDER — ROCURONIUM BROMIDE 100 MG/10ML IV SOLN
INTRAVENOUS | Status: DC | PRN
  Administered 2013-08-25: 35 mg via INTRAVENOUS

## 2013-08-25 MED ORDER — BUPIVACAINE LIPOSOME 1.3 % IJ SUSP
20.0000 mL | INTRAMUSCULAR | Status: DC
  Filled 2013-08-25: qty 20

## 2013-08-25 MED ORDER — VENLAFAXINE HCL ER 150 MG PO CP24
150.0000 mg | ORAL_CAPSULE | Freq: Every day | ORAL | Status: DC
  Administered 2013-08-26 – 2013-08-29 (×4): 150 mg via ORAL
  Filled 2013-08-25 (×5): qty 1

## 2013-08-25 MED ORDER — LACTATED RINGERS IV SOLN
INTRAVENOUS | Status: DC | PRN
  Administered 2013-08-25 (×2): via INTRAVENOUS

## 2013-08-25 MED ORDER — FENTANYL CITRATE 0.05 MG/ML IJ SOLN
INTRAMUSCULAR | Status: AC
  Filled 2013-08-25: qty 2

## 2013-08-25 MED ORDER — TRANEXAMIC ACID 100 MG/ML IV SOLN
1000.0000 mg | INTRAVENOUS | Status: AC
  Administered 2013-08-25: 1000 mg via INTRAVENOUS
  Filled 2013-08-25: qty 10

## 2013-08-25 MED ORDER — HYDROCHLOROTHIAZIDE 25 MG PO TABS
25.0000 mg | ORAL_TABLET | Freq: Every day | ORAL | Status: DC
  Administered 2013-08-25 – 2013-08-29 (×3): 25 mg via ORAL
  Filled 2013-08-25 (×6): qty 1

## 2013-08-25 MED ORDER — MIDAZOLAM HCL 2 MG/2ML IJ SOLN
INTRAMUSCULAR | Status: AC
  Administered 2013-08-25: 2 mg
  Filled 2013-08-25: qty 2

## 2013-08-25 MED ORDER — DIPHENHYDRAMINE HCL 12.5 MG/5ML PO ELIX
12.5000 mg | ORAL_SOLUTION | ORAL | Status: DC | PRN
  Administered 2013-08-27 (×2): 25 mg via ORAL
  Filled 2013-08-25 (×2): qty 10

## 2013-08-25 MED ORDER — BISACODYL 5 MG PO TBEC
5.0000 mg | DELAYED_RELEASE_TABLET | Freq: Every day | ORAL | Status: DC | PRN
  Administered 2013-08-27: 5 mg via ORAL
  Filled 2013-08-25: qty 1

## 2013-08-25 MED ORDER — HYDROMORPHONE HCL PF 1 MG/ML IJ SOLN
INTRAMUSCULAR | Status: AC
  Administered 2013-08-25: 1 mg
  Filled 2013-08-25: qty 1

## 2013-08-25 MED ORDER — BUPIVACAINE LIPOSOME 1.3 % IJ SUSP
INTRAMUSCULAR | Status: DC | PRN
  Administered 2013-08-25: 20 mL

## 2013-08-25 MED ORDER — SODIUM CHLORIDE 0.9 % IJ SOLN
INTRAMUSCULAR | Status: DC | PRN
  Administered 2013-08-25: 10 mL

## 2013-08-25 MED ORDER — MIDAZOLAM HCL 2 MG/2ML IJ SOLN: 1.0000 mg | INTRAMUSCULAR | Status: DC | PRN

## 2013-08-25 MED ORDER — ONDANSETRON HCL 4 MG PO TABS: 4.0000 mg | ORAL_TABLET | Freq: Four times a day (QID) | ORAL | Status: DC | PRN

## 2013-08-25 MED ORDER — ZOLPIDEM TARTRATE 5 MG PO TABS: 5.0000 mg | ORAL_TABLET | Freq: Every evening | ORAL | Status: DC | PRN

## 2013-08-25 MED ORDER — METOCLOPRAMIDE HCL 10 MG PO TABS: 5.0000 mg | ORAL_TABLET | Freq: Three times a day (TID) | ORAL | Status: DC | PRN

## 2013-08-25 MED ORDER — ACETAMINOPHEN 325 MG PO TABS: 650.0000 mg | ORAL_TABLET | Freq: Four times a day (QID) | ORAL | Status: DC | PRN

## 2013-08-25 MED ORDER — METHOCARBAMOL 100 MG/ML IJ SOLN
500.0000 mg | Freq: Once | INTRAVENOUS | Status: AC
  Administered 2013-08-25: 500 mg via INTRAVENOUS
  Filled 2013-08-25: qty 5

## 2013-08-25 MED ORDER — MENTHOL 3 MG MT LOZG: 1.0000 | LOZENGE | OROMUCOSAL | Status: DC | PRN

## 2013-08-25 MED ORDER — DEXTROSE-NACL 5-0.45 % IV SOLN: INTRAVENOUS | Status: DC

## 2013-08-25 MED ORDER — DOCUSATE SODIUM 100 MG PO CAPS
100.0000 mg | ORAL_CAPSULE | Freq: Two times a day (BID) | ORAL | Status: DC
  Administered 2013-08-25 – 2013-08-29 (×8): 100 mg via ORAL
  Filled 2013-08-25 (×11): qty 1

## 2013-08-25 MED ORDER — ASPIRIN EC 325 MG PO TBEC: 325.0000 mg | DELAYED_RELEASE_TABLET | Freq: Two times a day (BID) | ORAL | Status: DC

## 2013-08-25 MED ORDER — GLYCOPYRROLATE 0.2 MG/ML IJ SOLN
INTRAMUSCULAR | Status: DC | PRN
  Administered 2013-08-25: .4 mg via INTRAVENOUS

## 2013-08-25 MED ORDER — ASPIRIN EC 325 MG PO TBEC
325.0000 mg | DELAYED_RELEASE_TABLET | Freq: Every day | ORAL | Status: DC
  Administered 2013-08-26 – 2013-08-29 (×4): 325 mg via ORAL
  Filled 2013-08-25 (×5): qty 1

## 2013-08-25 MED ORDER — HYDROMORPHONE HCL PF 1 MG/ML IJ SOLN
0.2500 mg | INTRAMUSCULAR | Status: DC | PRN
  Administered 2013-08-25 (×2): 0.5 mg via INTRAVENOUS

## 2013-08-25 MED ORDER — OXYCODONE-ACETAMINOPHEN 5-325 MG PO TABS: 1.0000 | ORAL_TABLET | ORAL | Status: DC | PRN

## 2013-08-25 MED ORDER — METOCLOPRAMIDE HCL 5 MG/ML IJ SOLN
5.0000 mg | Freq: Three times a day (TID) | INTRAMUSCULAR | Status: DC | PRN
  Administered 2013-08-26: 5 mg via INTRAVENOUS
  Filled 2013-08-25: qty 2

## 2013-08-25 MED ORDER — PROPOFOL 10 MG/ML IV BOLUS
INTRAVENOUS | Status: DC | PRN
  Administered 2013-08-25: 150 mg via INTRAVENOUS

## 2013-08-25 MED ORDER — SODIUM CHLORIDE 0.9 % IR SOLN
Status: DC | PRN
  Administered 2013-08-25: 3000 mL

## 2013-08-25 MED ORDER — BUPIVACAINE LIPOSOME 1.3 % IJ SUSP: 20.0000 mL | Freq: Once | INTRAMUSCULAR | Status: DC

## 2013-08-25 MED ORDER — FENTANYL CITRATE 0.05 MG/ML IJ SOLN
50.0000 ug | INTRAMUSCULAR | Status: DC | PRN
  Administered 2013-08-25: 25 ug via INTRAVENOUS

## 2013-08-25 MED ORDER — NEOSTIGMINE METHYLSULFATE 1 MG/ML IJ SOLN
INTRAMUSCULAR | Status: DC | PRN
  Administered 2013-08-25: 3 mg via INTRAVENOUS

## 2013-08-25 SURGICAL SUPPLY — 56 items
BANDAGE ELASTIC 6 VELCRO ST LF (GAUZE/BANDAGES/DRESSINGS) ×1 IMPLANT
BANDAGE ESMARK 6X9 LF (GAUZE/BANDAGES/DRESSINGS) ×1 IMPLANT
BLADE SAG 18X100X1.27 (BLADE) ×2 IMPLANT
BLADE SAW SGTL 13X75X1.27 (BLADE) ×2 IMPLANT
BLADE SURG ROTATE 9660 (MISCELLANEOUS) IMPLANT
BNDG CMPR 9X6 STRL LF SNTH (GAUZE/BANDAGES/DRESSINGS) ×1
BNDG CMPR MED 10X6 ELC LF (GAUZE/BANDAGES/DRESSINGS) ×1
BNDG ELASTIC 6X10 VLCR STRL LF (GAUZE/BANDAGES/DRESSINGS) ×2 IMPLANT
BNDG ESMARK 6X9 LF (GAUZE/BANDAGES/DRESSINGS) ×2
BOWL SMART MIX CTS (DISPOSABLE) ×2 IMPLANT
CAPT RP KNEE ×1 IMPLANT
CEMENT HV SMART SET (Cement) ×4 IMPLANT
CLOTH BEACON ORANGE TIMEOUT ST (SAFETY) ×2 IMPLANT
COVER SURGICAL LIGHT HANDLE (MISCELLANEOUS) ×2 IMPLANT
CUFF TOURNIQUET SINGLE 34IN LL (TOURNIQUET CUFF) IMPLANT
CUFF TOURNIQUET SINGLE 44IN (TOURNIQUET CUFF) IMPLANT
DRAPE EXTREMITY T 121X128X90 (DRAPE) ×2 IMPLANT
DRAPE U-SHAPE 47X51 STRL (DRAPES) ×2 IMPLANT
DURAPREP 26ML APPLICATOR (WOUND CARE) ×2 IMPLANT
ELECT REM PT RETURN 9FT ADLT (ELECTROSURGICAL) ×2
ELECTRODE REM PT RTRN 9FT ADLT (ELECTROSURGICAL) ×1 IMPLANT
EVACUATOR 1/8 PVC DRAIN (DRAIN) ×2 IMPLANT
GAUZE XEROFORM 1X8 LF (GAUZE/BANDAGES/DRESSINGS) ×2 IMPLANT
GLOVE BIO SURGEON STRL SZ7.5 (GLOVE) ×2 IMPLANT
GLOVE BIO SURGEON STRL SZ8.5 (GLOVE) ×4 IMPLANT
GLOVE BIOGEL PI IND STRL 8 (GLOVE) ×2 IMPLANT
GLOVE BIOGEL PI IND STRL 9 (GLOVE) ×1 IMPLANT
GLOVE BIOGEL PI INDICATOR 8 (GLOVE) ×2
GLOVE BIOGEL PI INDICATOR 9 (GLOVE) ×1
GOWN PREVENTION PLUS XLARGE (GOWN DISPOSABLE) ×2 IMPLANT
GOWN STRL NON-REIN LRG LVL3 (GOWN DISPOSABLE) ×3 IMPLANT
GOWN STRL REIN XL XLG (GOWN DISPOSABLE) ×4 IMPLANT
HANDPIECE INTERPULSE COAX TIP (DISPOSABLE) ×2
HOOD PEEL AWAY FACE SHEILD DIS (HOOD) ×6 IMPLANT
KIT BASIN OR (CUSTOM PROCEDURE TRAY) ×2 IMPLANT
KIT ROOM TURNOVER OR (KITS) ×2 IMPLANT
MANIFOLD NEPTUNE II (INSTRUMENTS) ×2 IMPLANT
NS IRRIG 1000ML POUR BTL (IV SOLUTION) ×2 IMPLANT
PACK TOTAL JOINT (CUSTOM PROCEDURE TRAY) ×2 IMPLANT
PAD ARMBOARD 7.5X6 YLW CONV (MISCELLANEOUS) ×4 IMPLANT
PADDING CAST COTTON 6X4 STRL (CAST SUPPLIES) ×2 IMPLANT
SET HNDPC FAN SPRY TIP SCT (DISPOSABLE) ×1 IMPLANT
SPONGE GAUZE 4X4 12PLY (GAUZE/BANDAGES/DRESSINGS) ×3 IMPLANT
STAPLER VISISTAT 35W (STAPLE) ×2 IMPLANT
SUCTION FRAZIER TIP 10 FR DISP (SUCTIONS) ×2 IMPLANT
SUT VIC AB 0 CTX 36 (SUTURE) ×2
SUT VIC AB 0 CTX36XBRD ANTBCTR (SUTURE) ×1 IMPLANT
SUT VIC AB 1 CTX 36 (SUTURE) ×2
SUT VIC AB 1 CTX36XBRD ANBCTR (SUTURE) ×1 IMPLANT
SUT VIC AB 2-0 CT1 27 (SUTURE) ×2
SUT VIC AB 2-0 CT1 TAPERPNT 27 (SUTURE) ×1 IMPLANT
SYR 50ML LL SCALE MARK (SYRINGE) ×1 IMPLANT
TOWEL OR 17X24 6PK STRL BLUE (TOWEL DISPOSABLE) ×2 IMPLANT
TOWEL OR 17X26 10 PK STRL BLUE (TOWEL DISPOSABLE) ×2 IMPLANT
TRAY FOLEY CATH 14FR (SET/KITS/TRAYS/PACK) IMPLANT
WATER STERILE IRR 1000ML POUR (IV SOLUTION) ×4 IMPLANT

## 2013-08-25 NOTE — Anesthesia Procedure Notes (Addendum)
Procedure Name: Intubation Date/Time: 08/25/2013 1:09 PM Performed by: Angelica Pou Pre-anesthesia Checklist: Patient identified, Timeout performed, Emergency Drugs available, Suction available and Patient being monitored Patient Re-evaluated:Patient Re-evaluated prior to inductionOxygen Delivery Method: Circle system utilized Preoxygenation: Pre-oxygenation with 100% oxygen Intubation Type: IV induction Laryngoscope Size: Mac and 3 Grade View: Grade I Tube type: Oral Tube size: 7.5 mm Number of attempts: 1 Airway Equipment and Method: Stylet Placement Confirmation: ETT inserted through vocal cords under direct vision,  breath sounds checked- equal and bilateral and positive ETCO2 Secured at: 22 cm Tube secured with: Tape Dental Injury: Teeth and Oropharynx as per pre-operative assessment    Anesthesia Regional Block:  Adductor canal block  Pre-Anesthetic Checklist: ,, timeout performed, Correct Patient, Correct Site, Correct Procedure, Correct Position, site marked, risks and benefits discussed, surgical consent, pre-op evaluation,  At surgeon's request and post-op pain management  Laterality: Right  Prep: chloraprep       Needles:   Needle Type: Echogenic Needle          Additional Needles:  Procedures: Doppler guided and nerve stimulator Adductor canal block  Nerve Stimulator or Paresthesia:  Response: 0.5 mA,   Additional Responses:   Narrative:  Start time: 08/25/2013 11:30 AM End time: 08/25/2013 11:45 AM Injection made incrementally with aspirations every 5 mL.  Performed by: Personally  Anesthesiologist: Dr. Randa Evens

## 2013-08-25 NOTE — Progress Notes (Signed)
Orthopedic Tech Progress Note Patient Details:  Jordan [name] Feb 25, 1961 [id number]  CPM Right Knee CPM Right Knee: On Right Knee Flexion (Degrees): 60 Right Knee Extension (Degrees): 0 Additional Comments: Trapeze bar and foot roll   Cammer, Mickie Bail 08/25/2013, 5:17 PM

## 2013-08-25 NOTE — Preoperative (Signed)
Beta Blockers   Reason not to administer Beta Blockers:Not Applicable 

## 2013-08-25 NOTE — Anesthesia Preprocedure Evaluation (Addendum)
Anesthesia Evaluation  Patient identified by MRN, date of birth, ID band Patient awake    Reviewed: Allergy & Precautions, H&P , NPO status , Patient's Chart, lab work & pertinent test results  Airway Mallampati: II      Dental   Pulmonary neg pulmonary ROS,  breath sounds clear to auscultation        Cardiovascular hypertension, Rhythm:Regular Rate:Normal     Neuro/Psych    GI/Hepatic negative GI ROS, Neg liver ROS,   Endo/Other  negative endocrine ROS  Renal/GU      Musculoskeletal   Abdominal   Peds  Hematology   Anesthesia Other Findings   Reproductive/Obstetrics                          Anesthesia Physical Anesthesia Plan  ASA: III  Anesthesia Plan:    Post-op Pain Management:    Induction: Intravenous  Airway Management Planned: Oral ETT  Additional Equipment:   Intra-op Plan:   Post-operative Plan: Extubation in OR  Informed Consent: I have reviewed the patients History and Physical, chart, labs and discussed the procedure including the risks, benefits and alternatives for the proposed anesthesia with the patient or authorized representative who has indicated his/her understanding and acceptance.   Dental advisory given  Plan Discussed with: CRNA, Anesthesiologist and Surgeon  Anesthesia Plan Comments:         Anesthesia Quick Evaluation

## 2013-08-25 NOTE — Anesthesia Postprocedure Evaluation (Signed)
  Anesthesia Post-op Note  Patient: Jordan [name]  Procedure(s) Performed: Procedure(s): TOTAL KNEE ARTHROPLASTY (Right)  Patient Location: PACU  Anesthesia Type:General  Level of Consciousness: awake  Airway and Oxygen Therapy: Patient Spontanous Breathing  Post-op Pain: mild  Post-op Assessment: Post-op Vital signs reviewed  Post-op Vital Signs: Reviewed  Complications: No apparent anesthesia complications

## 2013-08-25 NOTE — Op Note (Signed)
PATIENT ID:      Jordan [name]  MRN:     [id number] DOB/AGE:    1961-01-24 / 52 y.o.       OPERATIVE REPORT    DATE OF PROCEDURE:  08/25/2013       PREOPERATIVE DIAGNOSIS:   RIGHT KNEE OSTEOARTHRITIS      There is no weight on file to calculate BMI.                                                        POSTOPERATIVE DIAGNOSIS:   osteo-arthritis right knee                                                                      PROCEDURE:  Procedure(s): TOTAL KNEE ARTHROPLASTY Using Depuy Sigma RP implants #$L Femur, #3Tibia, 10mm sigma RP bearing, 25 Patella     SURGEON: Vivienne Sangiovanni J    ASSISTANT:   Eric K. [employer]   (Present and scrubbed throughout the case, critical for assistance with exposure, retraction, instrumentation, and closure.)         ANESTHESIA: GET Exparel  DRAINS: foley, 2 medium hemovac in knee   TOURNIQUET TIME:   COMPLICATIONS:  None     SPECIMENS: None   INDICATIONS FOR PROCEDURE: The patient has  RIGHT KNEE OSTEOARTHRITIS, varus deformities, XR shows bone on bone arthritis. Patient has failed all conservative measures including anti-inflammatory medicines, narcotics, attempts at  exercise and weight loss, cortisone injections and viscosupplementation.  Risks and benefits of surgery have been discussed, questions answered.   DESCRIPTION OF PROCEDURE: The patient identified by armband, received  IV antibiotics, in the holding area at [facility]. Patient taken to the operating room, appropriate anesthetic  monitors were attached, and general endotracheal anesthesia induced with  the patient in supine position, Foley catheter was inserted. Tourniquet  applied high to the operative thigh. Lateral post and foot positioner  applied to the table, the lower extremity was then prepped and draped  in usual sterile fashion from the ankle to the tourniquet. Time-out procedure was performed. The limb was wrapped with an Esmarch bandage and the tourniquet  inflated to 350 mmHg. We began the operation by making the anterior midline incision starting at handbreadth above the patella going over the patella 1 cm medial to and  4 cm distal to the tibial tubercle. Small bleeders in the skin and the  subcutaneous tissue identified and cauterized. Transverse retinaculum was incised and reflected medially and a medial parapatellar arthrotomy was accomplished. the patella was everted and theprepatellar fat pad resected. The superficial medial collateral  ligament was then elevated from anterior to posterior along the proximal  flare of the tibia and anterior half of the menisci resected. The knee was hyperflexed exposing bone on bone arthritis. Peripheral and notch osteophytes as well as the cruciate ligaments were then resected. We continued to  work our way around posteriorly along the proximal tibia, and externally  rotated the tibia subluxing it out from underneath the femur. A McHale  retractor was placed through the  notch and a lateral Hohmann retractor  placed, and we then drilled through the proximal tibia in line with the  axis of the tibia followed by an intramedullary guide rod and 2-degree  posterior slope cutting guide. The tibial cutting guide was pinned into place  allowing resection of 6 mm of bone medially and about 10 mm of bone  laterally because of her varus deformity. Satisfied with the tibial resection, we then  entered the distal femur 2 mm anterior to the PCL origin with the  intramedullary guide rod and applied the distal femoral cutting guide  set at 11mm, with 5 degrees of valgus. This was pinned along the  epicondylar axis. At this point, the distal femoral cut was accomplished without difficulty. We then sized for a #4NR femoral component and pinned the guide in 0 degrees of external rotation.The chamfer cutting guide was pinned into place. The anterior, posterior, and chamfer cuts were accomplished without difficulty followed by   the Sigma RP box cutting guide and the box cut. We also removed posterior osteophytes from the posterior femoral condyles. At this  time, the knee was brought into full extension. We checked our  extension and flexion gaps and found them symmetric at 10mm.  The patella thickness measured at 24 mm. We set the cutting guide at 15 and removed the posterior 10 mm  of the patella, sized for a 35 button and drilled the lollipop. The knee  was then once again hyperflexed exposing the proximal tibia. We sized for a #3 tibial base plate, applied the smokestack and the conical reamer followed by the the Delta fin keel punch. We then hammered into place the Sigma RP trial femoral component, inserted a 10-mm trial bearing, trial patellar button, and took the knee through range of motion from 0-130 degrees. No thumb pressure was required for patellar  tracking. At this point, all trial components were removed, a double batch of DePuy HV cement with 1500 mg of Zinacef was mixed and applied to all bony metallic mating surfaces except for the posterior condyles of the femur itself. In order, we  hammered into place the tibial tray and removed excess cement, the femoral component and removed excess cement, a 10-mm Sigma RP bearing  was inserted, and the knee brought to full extension with compression.  The patellar button was clamped into place, and excess cement  removed. While the cement cured the wound was irrigated out with normal saline solution pulse lavage, and medium Hemovac drains were placed from an anterolateral  approach. Ligament stability and patellar tracking were checked and found to be excellent. The parapatellar arthrotomy was closed with  running #1 Vicryl suture. The subcutaneous tissue with 0 and 2-0 undyed  Vicryl suture, and the skin with skin staples. A dressing of Xeroform,  4 x 4, dressing sponges, Webril, and Ace wrap applied. The patient  awakened, extubated, and taken to recovery room  without difficulty.   Gean Birchwood J 08/25/2013, 2:24 PM

## 2013-08-25 NOTE — Plan of Care (Signed)
Problem: Consults Goal: Diagnosis- Total Joint Replacement Primary Total Knee Right     

## 2013-08-25 NOTE — Interval H&P Note (Signed)
History and Physical Interval Note:  08/25/2013 12:29 PM  Jordan [name]  has presented today for surgery, with the diagnosis of RIGHT KNEE OSTEOARTHRITIS  The various methods of treatment have been discussed with the patient and family. After consideration of risks, benefits and other options for treatment, the patient has consented to  Procedure(s): TOTAL KNEE ARTHROPLASTY (Right) as a surgical intervention .  The patient's history has been reviewed, patient examined, no change in status, stable for surgery.  I have reviewed the patient's chart and labs.  Questions were answered to the patient's satisfaction.     Nestor Lewandowsky

## 2013-08-25 NOTE — Anesthesia Postprocedure Evaluation (Signed)
  Anesthesia Post-op Note  Patient: Jordan [name]  Procedure(s) Performed: Procedure(s): TOTAL KNEE ARTHROPLASTY (Right)  Patient Location: PACU  Anesthesia Type:GA combined with regional for post-op pain  Level of Consciousness: awake, alert  and oriented  Airway and Oxygen Therapy: Patient Spontanous Breathing and Patient connected to nasal cannula oxygen  Post-op Pain: moderate  Post-op Assessment: Post-op Vital signs reviewed  Post-op Vital Signs: Reviewed  Complications: No apparent anesthesia complications

## 2013-08-25 NOTE — Transfer of Care (Signed)
Immediate Anesthesia Transfer of Care Note  Patient: Jordan [name]  Procedure(s) Performed: Procedure(s): TOTAL KNEE ARTHROPLASTY (Right)  Patient Location: PACU  Anesthesia Type:General  Level of Consciousness: awake, alert , oriented and patient cooperative  Airway & Oxygen Therapy: Patient Spontanous Breathing and Patient connected to nasal cannula oxygen  Post-op Assessment: Report given to PACU RN and Post -op Vital signs reviewed and stable  Post vital signs: Reviewed and stable  Complications: No apparent anesthesia complications

## 2013-08-26 ENCOUNTER — Encounter (HOSPITAL_COMMUNITY): Payer: Self-pay | Admitting: Orthopedic Surgery

## 2013-08-26 DIAGNOSIS — M1710 Unilateral primary osteoarthritis, unspecified knee: Secondary | ICD-10-CM

## 2013-08-26 DIAGNOSIS — E871 Hypo-osmolality and hyponatremia: Secondary | ICD-10-CM

## 2013-08-26 HISTORY — DX: Hypo-osmolality and hyponatremia: E87.1

## 2013-08-26 LAB — CBC
HCT: 34.2 % — ABNORMAL LOW (ref 36.0–46.0)
Hemoglobin: 12 g/dL (ref 12.0–15.0)
MCH: 32.4 pg (ref 26.0–34.0)
MCHC: 35.1 g/dL (ref 30.0–36.0)
MCV: 92.4 fL (ref 78.0–100.0)
Platelets: 235 10*3/uL (ref 150–400)
RBC: 3.7 MIL/uL — ABNORMAL LOW (ref 3.87–5.11)
RDW: 12 % (ref 11.5–15.5)
WBC: 13.4 10*3/uL — ABNORMAL HIGH (ref 4.0–10.5)

## 2013-08-26 LAB — SODIUM
Sodium: 115 mEq/L — CL (ref 135–145)
Sodium: 112 mEq/L — CL (ref 135–145)
Sodium: 113 mEq/L — CL (ref 135–145)
Sodium: 114 mEq/L — CL (ref 135–145)
Sodium: 113 mEq/L — CL (ref 135–145)

## 2013-08-26 LAB — BASIC METABOLIC PANEL
BUN: 6 mg/dL (ref 6–23)
CO2: 20 mEq/L (ref 19–32)
Calcium: 8.7 mg/dL (ref 8.4–10.5)
Chloride: 81 mEq/L — ABNORMAL LOW (ref 96–112)
Creatinine, Ser: 0.55 mg/dL (ref 0.50–1.10)
GFR calc Af Amer: >90 mL/min (ref >90)
GFR calc non Af Amer: >90 mL/min (ref >90)
Glucose, Bld: 167 mg/dL — ABNORMAL HIGH (ref 70–99)
Potassium: 4 mEq/L (ref 3.5–5.1)
Sodium: 114 mEq/L — CL (ref 135–145)

## 2013-08-26 LAB — OSMOLALITY: Osmolality: 248 mOsm/kg — ABNORMAL LOW (ref 275–300)

## 2013-08-26 MED ORDER — SODIUM CHLORIDE 3 % IV SOLN
INTRAVENOUS | Status: DC
  Administered 2013-08-26: 20:00:00 via INTRAVENOUS
  Filled 2013-08-26 (×2): qty 500

## 2013-08-26 MED ORDER — SODIUM CHLORIDE 0.9 % IV SOLN
INTRAVENOUS | Status: DC
  Administered 2013-08-26: 15:00:00 via INTRAVENOUS

## 2013-08-26 MED ORDER — SODIUM CHLORIDE 3 % IV SOLN: INTRAVENOUS | Status: DC

## 2013-08-26 MED ORDER — SODIUM CHLORIDE 0.9 % IV BOLUS (SEPSIS)
500.0000 mL | Freq: Once | INTRAVENOUS | Status: AC
  Administered 2013-08-26: 500 mL via INTRAVENOUS

## 2013-08-26 NOTE — Progress Notes (Signed)
Utilization review completed. Alando Colleran, RN, BSN. 

## 2013-08-26 NOTE — Evaluation (Addendum)
Occupational Therapy Evaluation Patient Details Name: Jordan [name] MRN: [id number] DOB: 10/26/1961 Today's Date: 08/26/2013 Time: 1040-1100 OT Time Calculation (min): 20 min  OT Assessment / Plan / Recommendation History of present illness Pt. admitted with OA of R knee and underwent TKA R knee.     Clinical Impression   Pt admitted with above. Will continue to follow pt acutely in order to address below problem list in prep for return home with spouse.    OT Assessment  Patient needs continued OT Services    Follow Up Recommendations  No OT follow up;Supervision/Assistance - 24 hour    Barriers to Discharge      Equipment Recommendations  None recommended by OT    Recommendations for Other Services    Frequency  Min 2X/week    Precautions / Restrictions Precautions Precautions: Knee Precaution Booklet Issued: Yes (comment) Precaution Comments: Provided pt. with TKA exercise/precautions sheet and educated pt. on APs and QS's .  Pt. had knee of bed bent and reminded her that she needs to avoid positioning in bent position while resting. Restrictions Weight Bearing Restrictions: Yes RLE Weight Bearing: Weight bearing as tolerated   Pertinent Vitals/Pain See vitals    ADL  Upper Body Bathing: Simulated;Supervision/safety Where Assessed - Upper Body Bathing: Unsupported sitting Lower Body Bathing: Simulated;+2 Total assistance Lower Body Bathing: Patient Percentage: 70% Where Assessed - Lower Body Bathing: Supported sit to stand Upper Body Dressing: Performed;Supervision/safety Where Assessed - Upper Body Dressing: Unsupported sitting Lower Body Dressing: Performed;+2 Total assistance Lower Body Dressing: Patient Percentage: 70% Where Assessed - Lower Body Dressing: Supported sit to stand Toilet Transfer: Simulated;+2 Total assistance Toilet Transfer: Patient Percentage: 50% Statistician Method: Sit to Barista:  (bed<>chair) Equipment  Used: Rolling walker;Gait belt Transfers/Ambulation Related to ADLs: +2 total assist for safety and steadying. Attempted to ambulate to bathroom for toileting but pt became very dizzy and unsteady (reported feeling faint). Therapist quickly pulled up recliner and assisted pt to sitting position and reclined pt. ADL Comments: Limited ADL assessment due to pt c/o dizziness and nausea.    OT Diagnosis: Generalized weakness;Acute pain  OT Problem List: Decreased strength;Decreased activity tolerance;Impaired balance (sitting and/or standing);Decreased knowledge of use of DME or AE;Pain OT Treatment Interventions: Self-care/ADL training;DME and/or AE instruction;Therapeutic activities;Patient/family education;Balance training   OT Goals(Current goals can be found in the care plan section) Acute Rehab OT Goals Patient Stated Goal: home with husband's assist OT Goal Formulation: With patient Time For Goal Achievement: 09/02/13 Potential to Achieve Goals: Good  Visit Information  Last OT Received On: 08/26/13 Assistance Needed: +2 History of Present Illness: Pt. admitted with OA of R knee and underwent TKA R knee.         Prior Functioning     Home Living Family/patient expects to be discharged to:: Private residence Living Arrangements: Spouse/significant other Available Help at Discharge: Family;Available 24 hours/day Type of Home: House Home Access: Ramped entrance Home Layout: One level Home Equipment: Bedside commode;Walker - 2 wheels Prior Function Level of Independence: Independent Communication Communication: No difficulties         Vision/Perception     Cognition  Cognition Arousal/Alertness: Awake/alert Behavior During Therapy: WFL for tasks assessed/performed Overall Cognitive Status: Within Functional Limits for tasks assessed    Extremity/Trunk Assessment Upper Extremity Assessment Upper Extremity Assessment: Overall WFL for tasks assessed     Mobility Bed  Mobility Bed Mobility: Supine to Sit;Sitting - Scoot to Edge of Bed;Sit to Supine Supine  to Sit: 4: Min assist;HOB flat Sitting - Scoot to [place] of Bed: 4: Min guard Sit to Supine: Not Tested (comment) Details for Bed Mobility Assistance: cues for technique; min assist to manage R LE Transfers Transfers: Sit to Stand;Stand to Sit Sit to Stand: 1: +2 Total assist;With upper extremity assist;From bed Sit to Stand: Patient Percentage: 70% Stand to Sit: 1: +2 Total assist;With armrests;With upper extremity assist;To chair/3-in-1 Stand to Sit: Patient Percentage: 50% Details for Transfer Assistance: Cues for technique and safety needed.  Pt. became dizzy while taking a few steps and needed to be assisted back into seated position in recliner with 2 total assist pt. 50%     Exercise     Balance Balance Balance Assessed: Yes Dynamic Standing Balance Dynamic Standing - Balance Support: Bilateral upper extremity supported;During functional activity Dynamic Standing - Level of Assistance: 4: Min assist   End of Session OT - End of Session Equipment Utilized During Treatment: Gait belt;Rolling walker Activity Tolerance:  (limited by dizziness/feeling faint) Patient left: in chair;with call bell/phone within reach;with nursing/sitter in room Nurse Communication: Mobility status  GO    08/26/2013 Cipriano Mile OTR/L Pager [phone] Office [phone]  Cipriano Mile 08/26/2013, 3:38 PM

## 2013-08-26 NOTE — Progress Notes (Signed)
Patient ID: Jordan [name], female   DOB: 1961/05/04, 52 y.o.   MRN: [id number] PATIENT ID: Jordan [name]  MRN: [id number]  DOB/AGE:  14-Apr-1961 / 52 y.o.  1 Day Post-Op Procedure(s) (LRB): TOTAL KNEE ARTHROPLASTY (Right)    PROGRESS NOTE Subjective: Patient is alert, oriented, 1x Nausea, 1x Vomiting, yes passing gas, no Bowel Movement. Taking PO sips. Denies SOB, Chest or Calf Pain. Using Incentive Spirometer, PAS in place. Ambulate WBAT today, CPM 0-60 Patient reports pain as 5 on 0-10 scale  .    Objective: Vital signs in last 24 hours: Filed Vitals:   08/25/13 1803 08/25/13 2050 08/26/13 0030 08/26/13 0544  BP: 131/101 169/66 148/70 146/65  Pulse: 78 90  91  Temp: 98.3 F (36.8 C) 97.9 F (36.6 C)  98.4 F (36.9 C)  TempSrc:  Oral  Oral  Resp: 13 16  16   SpO2: 100% 100%  100%      Intake/Output from previous day: I/O last 3 completed shifts: In: 1850 [I.V.:1850] Out: 2120 [Urine:1825; Drains:275; Blood:20]   Intake/Output this shift:     LABORATORY DATA:  Recent Labs  08/26/13 0605  WBC 13.4*  HGB 12.0  HCT 34.2*  PLT 235    Examination: Neurologically intact ABD soft Neurovascular intact Sensation intact distally Intact pulses distally Dorsiflexion/Plantar flexion intact Incision: no drainage No cellulitis present Compartment soft} Blood and plasma separated in drain indicating minimal recent drainage, drain pulled without difficulty.  Assessment:   1 Day Post-Op Procedure(s) (LRB): TOTAL KNEE ARTHROPLASTY (Right) ADDITIONAL DIAGNOSIS:  Hypertension  Plan: PT/OT WBAT, CPM 5/hrs day until ROM 0-90 degrees, then D/C CPM DVT Prophylaxis:  SCDx72hrs, ASA 325 mg BID x 2 weeks DISCHARGE PLAN: Home DISCHARGE NEEDS: HHPT, HHRN, CPM, Walker and 3-in-1 comode seat     Jordan [name] J 08/26/2013, 8:44 AM

## 2013-08-26 NOTE — Consult Note (Signed)
[facility] Medical Consultation  ALEESHA RINGSTAD WUJ:[id number] DOB: May 15, 1961 DOA: 08/25/2013 PCP: Gretel Acre, MD   Requesting physician: Dr. Turner Daniels Date of consultation: 08/26/13 Reason for consultation: Hyponatremia  Impression/Recommendations Principal Problem:   Arthritis of knee, left  1. Hyponatremia: 1. Agree with normal saline 2. Will check serial Na levels q4hrs to ensure normalization 3. Will check serum osm 2. S/p R knee arthoplasty 1. Per primary surgical service  I will followup again tomorrow. Please contact me if I can be of assistance in the meanwhile. Thank you for this consultation.  Chief Complaint: Knee pain  HPI:  The patient is a 52yo female with a hx of OA resistant to conservative tx who presented to the surgical service for an elective R knee arthoplasty. Per a review of the order history, the patient was continued on D5 1/2NS on 9/12. An initial serum Na was noted to be normal at 136. Post-operatively, the patient was noted to have a serum sodium of 114 on 9/18. The patient was started on aggressive NS IVF. The hospitalist service was consulted for management for hyponatremia. When seen, the patient only complained of post-op LE pain and was otherwise asymptomatic.  Review of Systems:  Per above, remainder of 10pt review of systems reviewed and are neg  Past Medical History  Diagnosis Date  . MVP (mitral valve prolapse)   . Hypertension   . Arthritis    Past Surgical History  Procedure Laterality Date  . Cervical discectomy  08  . Abdominal hysterectomy  94  . Shoulder arthroscopy Left     bone spur  . Knee arthroscopy Right 09,and ?  Marland Kitchen Appendectomy     Social History:  reports that she quit smoking 6 days ago. Her smoking use included Cigarettes. She has a 7 pack-year smoking history. She does not have any smokeless tobacco history on file. She reports that she drinks about 0.5 ounces of alcohol per week. She reports that she does not  use illicit drugs.  Allergies  Allergen Reactions  . Guaifenesin & Derivatives Hives  . Naprosyn [Naproxen] Hives   History reviewed. No pertinent family history.  Prior to Admission medications   Medication Sig Start Date End Date Taking? Authorizing Provider  amLODipine (NORVASC) 2.5 MG tablet Take 2.5 mg by mouth daily.   Yes Historical Provider, MD  CALCIUM PO Take 1 tablet by mouth daily.   Yes Historical Provider, MD  fish oil-omega-3 fatty acids 1000 MG capsule Take 1 g by mouth daily.   Yes Historical Provider, MD  lisinopril-hydrochlorothiazide (PRINZIDE,ZESTORETIC) 20-25 MG per tablet Take 1 tablet by mouth daily.   Yes Historical Provider, MD  Multiple Vitamins-Minerals (MULTIVITAMIN WITH MINERALS) tablet Take 1 tablet by mouth daily.   Yes Historical Provider, MD  varenicline (CHANTIX) 0.5 MG tablet Take 0.5 mg by mouth daily.   Yes Historical Provider, MD  venlafaxine XR (EFFEXOR-XR) 75 MG 24 hr capsule Take 150 mg by mouth daily.   Yes Historical Provider, MD  aspirin EC 325 MG tablet Take 1 tablet (325 mg total) by mouth 2 (two) times daily. 08/25/13   Henry Russel, PA-C  methocarbamol (ROBAXIN) 500 MG tablet Take 1 tablet (500 mg total) by mouth 2 (two) times daily with a meal. 08/25/13   Henry Russel, PA-C  oxyCODONE-acetaminophen (ROXICET) 5-325 MG per tablet Take 1 tablet by mouth every 4 (four) hours as needed for pain. 08/25/13   Henry Russel, PA-C   Physical Exam: Blood pressure  147/64, pulse 86, temperature 98.2 F (36.8 C), temperature source Oral, resp. rate 18, SpO2 100.00%. Filed Vitals:   08/26/13 0544 08/26/13 0800 08/26/13 1002 08/26/13 1212  BP: 146/65  152/71 147/64  Pulse: 91  92 86  Temp: 98.4 F (36.9 C)  98.2 F (36.8 C)   TempSrc: Oral  Oral   Resp: 16 18 18    SpO2: 100% 100% 100%      General:  Awake, in nad  Eyes: PERRL B  ENT: membranes moist, dentition fair  Neck: trachea midline, neck supple  Cardiovascular: regular, s1,  s2  Respiratory: normal resp effort, no wheezing  Abdomen: soft, nondistended  Skin: normal skin turgor, no abnormal skin lesions seen  Musculoskeletal: perfused, no clubbing or cyanosis, dressings in place over RLE  Psychiatric: mood/affect normal // no auditory/visual hallucinations  Neurologic: cn2-12 grossly intact, strength/sensation intact  Labs on Admission:  Basic Metabolic Panel:  Recent Labs Lab 08/20/13 1134 08/26/13 0835  NA 136 114*  K 4.8 4.0  CL 101 81*  CO2 22 20  GLUCOSE 89 167*  BUN 18 6  CREATININE 0.85 0.55  CALCIUM 10.3 8.7   Liver Function Tests: No results found for this basename: AST, ALT, ALKPHOS, BILITOT, PROT, ALBUMIN,  in the last 168 hours No results found for this basename: LIPASE, AMYLASE,  in the last 168 hours No results found for this basename: AMMONIA,  in the last 168 hours CBC:  Recent Labs Lab 08/20/13 1134 08/26/13 0605  WBC 8.0 13.4*  NEUTROABS 5.4  --   HGB 14.7 12.0  HCT 42.3 34.2*  MCV 94.4 92.4  PLT 266 235   Cardiac Enzymes: No results found for this basename: CKTOTAL, CKMB, CKMBINDEX, TROPONINI,  in the last 168 hours BNP: No components found with this basename: POCBNP,  CBG: No results found for this basename: GLUCAP,  in the last 168 hours  Radiological Exams on Admission: No results found.  Time spent:  Izzac Rockett, Scheryl Marten [facility] Pager [phone]  If 7PM-7AM, please contact night-coverage [email or web address] Password [facility] 08/26/2013, 12:43 PM

## 2013-08-26 NOTE — Evaluation (Signed)
Physical Therapy Evaluation Patient Details Name: Jordan [name] MRN: [id number] DOB: 07-01-1961 Today's Date: 08/26/2013 Time: 4132-4401 PT Time Calculation (min): 20 min  PT Assessment / Plan / Recommendation History of Present Illness  Pt. admitted with OA of R knee and underwent TKA R knee.    Clinical Impression  This patient underwent a right TKA and presents to PT with anticipated post-op decrease in strength and ROM, decreased functional mobility and gait.  Pt. Will benefit from acute PT to address these and below issues. She also experienced dizziness during her first few steps  and needed to be assisted to recliner chair .  Unable to obtain BP in standing but it was in normal range once pt. Seated and nursing student checked it.  Once she has clearing of these symptoms, believe she will progress well for DCing home with HHPT follow up.      PT Assessment  Patient needs continued PT services    Follow Up Recommendations  Home health PT;Supervision/Assistance - 24 hour;Supervision for mobility/OOB    Does the patient have the potential to tolerate intense rehabilitation      Barriers to Discharge        Equipment Recommendations  None recommended by PT    Recommendations for Other Services     Frequency 7X/week    Precautions / Restrictions Precautions Precautions: Knee Precaution Booklet Issued: Yes (comment) Precaution Comments: Provided pt. with TKA exercise/precautions sheet and educated pt. on APs and QS's .  Pt. had knee of bed bent and reminded her that she needs to avoid positioning in bent position while resting. Restrictions Weight Bearing Restrictions: Yes RLE Weight Bearing: Weight bearing as tolerated   Pertinent Vitals/Pain See vitals tab       Mobility  Bed Mobility Bed Mobility: Supine to Sit;Sitting - Scoot to [place] of Bed;Sit to Supine Supine to Sit: 4: Min assist;HOB flat Sitting - Scoot to [place] of Bed: 4: Min guard Sit to Supine: Not  Tested (comment) Details for Bed Mobility Assistance: cues for technique; min assist to manage R LE Transfers Transfers: Sit to Stand;Stand to Sit Sit to Stand: 1: +2 Total assist;With upper extremity assist;From bed Sit to Stand: Patient Percentage: 70% Stand to Sit: 1: +2 Total assist;With armrests;With upper extremity assist;To chair/3-in-1 Stand to Sit: Patient Percentage: 50% Details for Transfer Assistance: Cues for technique and safety needed.  Pt. became dizzy while taking a few steps and needed to be assisted back into seated position in recliner with 2 total assist pt. 50% Ambulation/Gait Ambulation/Gait Assistance: 4: Min assist Ambulation Distance (Feet): 3 Feet Assistive device: Rolling walker Ambulation/Gait Assistance Details: cues for sequencing and technique; pt. became dizzy and needed to stop walking and recliner chair was brought up behnid her. Gait Pattern: Step-to pattern    Exercises Total Joint Exercises Ankle Circles/Pumps: AROM;Both;10 reps;Supine Quad Sets: AROM;Both;10 reps;Supine Goniometric ROM: -6 to 60 in sitting   PT Diagnosis: Difficulty walking;Abnormality of gait;Acute pain  PT Problem List: Decreased strength;Decreased range of motion;Decreased activity tolerance;Decreased balance;Decreased knowledge of use of DME;Decreased knowledge of precautions;Pain PT Treatment Interventions: DME instruction;Gait training;Functional mobility training;Therapeutic activities;Therapeutic exercise;Balance training;Patient/family education     PT Goals(Current goals can be found in the care plan section) Acute Rehab PT Goals Patient Stated Goal: home with husband's assist PT Goal Formulation: With patient Time For Goal Achievement: 09/02/13 Potential to Achieve Goals: Good  Visit Information  Last PT Received On: 08/26/13 Assistance Needed: +2 PT/OT Co-Evaluation/Treatment: Yes History of Present  Illness: Pt. admitted with OA of R knee and underwent TKA R  knee.         Prior Functioning  Home Living Family/patient expects to be discharged to:: Private residence Living Arrangements: Spouse/significant other Available Help at Discharge: Family;Available 24 hours/day Type of Home: House Home Access: Ramped entrance Home Layout: One level Home Equipment: Bedside commode;Walker - 2 wheels Prior Function Level of Independence: Independent Communication Communication: No difficulties    Cognition  Cognition Arousal/Alertness: Awake/alert Behavior During Therapy: WFL for tasks assessed/performed Overall Cognitive Status: Within Functional Limits for tasks assessed    Extremity/Trunk Assessment Upper Extremity Assessment Upper Extremity Assessment: Overall WFL for tasks assessed Lower Extremity Assessment Lower Extremity Assessment: RLE deficits/detail RLE Deficits / Details: good ankle pump, fair quad set Cervical / Trunk Assessment Cervical / Trunk Assessment: Normal   Balance Balance Balance Assessed: Yes Dynamic Standing Balance Dynamic Standing - Balance Support: Bilateral upper extremity supported;During functional activity Dynamic Standing - Level of Assistance: 4: Min assist  End of Session PT - End of Session Equipment Utilized During Treatment: Gait belt Activity Tolerance: Treatment limited secondary to medical complications (Comment);Other (comment) (dizziness) Patient left: in chair;with call bell/phone within reach;with nursing/sitter in room;Other (comment) Warehouse manager) Nurse Communication: Mobility status;Other (comment) (pt. became dizzy)  GP     Ferman Hamming 08/26/2013, 11:22 AM Weldon Picking PT Acute Rehab Services [phone] Beeper [phone]

## 2013-08-26 NOTE — Progress Notes (Signed)
Serum sodium initially improving with NS, however repeat Na noted to be lower at 112. Discussed with pharmacy. Plan to initiate 3% saline with an initial target of 120 with conversion back to NS and ensure normalization of sodium. Will follow serum Na closely.

## 2013-08-26 NOTE — Progress Notes (Signed)
Physical Therapy Treatment Patient Details Name: Jordan [name] MRN: [id number] DOB: [birth year] Today's Date: 08/26/2013 Time: 7829-5621 PT Time Calculation (min): 10 min  PT Assessment / Plan / Recommendation  History of Present Illness Pt. admitted with OA of R knee and underwent TKA R knee.     PT Comments   Pt. With limited ability to participate this session due to nausea and vomiting with attempt to stand.  RN Jordan [name] made aware and she is in checking on pt.    Follow Up Recommendations  Home health PT;Supervision/Assistance - 24 hour;Supervision for mobility/OOB     Does the patient have the potential to tolerate intense rehabilitation     Barriers to Discharge        Equipment Recommendations  None recommended by PT    Recommendations for Other Services    Frequency 7X/week   Progress towards PT Goals Progress towards PT goals: Not progressing toward goals - comment (due to nausea and vomiting)  Plan Current plan remains appropriate    Precautions / Restrictions Precautions Precautions: Knee Precaution Booklet Issued: Yes (comment) Precaution Comments: Provided pt. with TKA exercise/precautions sheet and educated pt. on APs and QS's .  Pt. had knee of bed bent and reminded her that she needs to avoid positioning in bent position while resting. Restrictions Weight Bearing Restrictions: Yes RLE Weight Bearing: Weight bearing as tolerated   Pertinent Vitals/Pain See vitals tab     Mobility   Transfers Transfers: Sit to Stand Sit to Stand: 4: Min assist Sit to Stand: Patient Percentage: 70% Stand to Sit: 1: +2 Total assist;With armrests;With upper extremity assist;To chair/3-in-1 Stand to Sit: Patient Percentage: 50% Details for Transfer Assistance: Initiated bringing pt's shoulders forward in recliner chair and lowering legs of recliner.  Attempted to stand , however pt. became nauseated and vomited.  Pt. was assisted back to reclined position in chair and her RN  Jordan [name] was notified.  Per Jordan [name], pt. just recently had zofran but is not eating.   Ambulation/Gait Ambulation/Gait Assistance: Not tested (comment)    Exercises     PT Diagnosis:    PT Problem List:   PT Treatment Interventions:     PT Goals (current goals can now be found in the care plan section) Acute Rehab PT Goals Patient Stated Goal: home with husband's assist  Visit Information  Last PT Received On: 08/26/13 Assistance Needed: +2 History of Present Illness: Pt. admitted with OA of R knee and underwent TKA R knee.      Subjective Data  Subjective: Pt. willing to attempt transfer back to bed despite nausea Patient Stated Goal: home with husband's assist   Cognition  Cognition Arousal/Alertness: Lethargic;Suspect due to medications Behavior During Therapy: [facility] for tasks assessed/performed Overall Cognitive Status: Within Functional Limits for tasks assessed       End of Session PT - End of Session Activity Tolerance: Treatment limited secondary to medical complications (Comment) (nausea and vomiting) Patient left: in chair;with call bell/phone within reach;with family/visitor present Nurse Communication: Mobility status;Other (comment) (N&V)   GP     Ferman Hamming 08/26/2013, 3:41 PM Weldon Picking PT Acute Rehab Services [phone] Beeper [phone]

## 2013-08-27 LAB — BASIC METABOLIC PANEL
BUN: 6 mg/dL (ref 6–23)
BUN: 6 mg/dL (ref 6–23)
BUN: 6 mg/dL (ref 6–23)
BUN: 6 mg/dL (ref 6–23)
BUN: 7 mg/dL (ref 6–23)
CO2: 24 mEq/L (ref 19–32)
CO2: 20 mEq/L (ref 19–32)
CO2: 25 mEq/L (ref 19–32)
CO2: 23 mEq/L (ref 19–32)
CO2: 28 mEq/L (ref 19–32)
Calcium: 9.1 mg/dL (ref 8.4–10.5)
Calcium: 9.3 mg/dL (ref 8.4–10.5)
Calcium: 9.2 mg/dL (ref 8.4–10.5)
Calcium: 9.4 mg/dL (ref 8.4–10.5)
Calcium: 8.9 mg/dL (ref 8.4–10.5)
Chloride: 84 mEq/L — ABNORMAL LOW (ref 96–112)
Chloride: 82 mEq/L — ABNORMAL LOW (ref 96–112)
Chloride: 83 mEq/L — ABNORMAL LOW (ref 96–112)
Chloride: 82 mEq/L — ABNORMAL LOW (ref 96–112)
Chloride: 88 mEq/L — ABNORMAL LOW (ref 96–112)
Creatinine, Ser: 0.69 mg/dL (ref 0.50–1.10)
Creatinine, Ser: 0.72 mg/dL (ref 0.50–1.10)
Creatinine, Ser: 0.68 mg/dL (ref 0.50–1.10)
Creatinine, Ser: 0.64 mg/dL (ref 0.50–1.10)
Creatinine, Ser: 0.73 mg/dL (ref 0.50–1.10)
GFR calc Af Amer: >90 mL/min (ref >90)
GFR calc Af Amer: >90 mL/min (ref >90)
GFR calc Af Amer: >90 mL/min (ref >90)
GFR calc Af Amer: >90 mL/min (ref >90)
GFR calc Af Amer: >90 mL/min (ref >90)
GFR calc non Af Amer: >90 mL/min (ref >90)
GFR calc non Af Amer: >90 mL/min (ref >90)
GFR calc non Af Amer: >90 mL/min (ref >90)
GFR calc non Af Amer: >90 mL/min (ref >90)
GFR calc non Af Amer: >90 mL/min (ref >90)
Glucose, Bld: 156 mg/dL — ABNORMAL HIGH (ref 70–99)
Glucose, Bld: 146 mg/dL — ABNORMAL HIGH (ref 70–99)
Glucose, Bld: 131 mg/dL — ABNORMAL HIGH (ref 70–99)
Glucose, Bld: 128 mg/dL — ABNORMAL HIGH (ref 70–99)
Glucose, Bld: 148 mg/dL — ABNORMAL HIGH (ref 70–99)
Potassium: 3.2 mEq/L — ABNORMAL LOW (ref 3.5–5.1)
Potassium: 3.4 mEq/L — ABNORMAL LOW (ref 3.5–5.1)
Potassium: 3.9 mEq/L (ref 3.5–5.1)
Potassium: 3.2 mEq/L — ABNORMAL LOW (ref 3.5–5.1)
Potassium: 3.8 mEq/L (ref 3.5–5.1)
Sodium: 120 mEq/L — ABNORMAL LOW (ref 135–145)
Sodium: 117 mEq/L — CL (ref 135–145)
Sodium: 119 mEq/L — CL (ref 135–145)
Sodium: 118 mEq/L — CL (ref 135–145)
Sodium: 124 mEq/L — ABNORMAL LOW (ref 135–145)

## 2013-08-27 LAB — CBC
HCT: 30.6 % — ABNORMAL LOW (ref 36.0–46.0)
Hemoglobin: 11 g/dL — ABNORMAL LOW (ref 12.0–15.0)
MCH: 31.8 pg (ref 26.0–34.0)
MCHC: 35.9 g/dL (ref 30.0–36.0)
MCV: 88.4 fL (ref 78.0–100.0)
Platelets: 216 10*3/uL (ref 150–400)
RBC: 3.46 MIL/uL — ABNORMAL LOW (ref 3.87–5.11)
RDW: 11.7 % (ref 11.5–15.5)
WBC: 13.1 10*3/uL — ABNORMAL HIGH (ref 4.0–10.5)

## 2013-08-27 LAB — SODIUM
Sodium: 114 mEq/L — CL (ref 135–145)
Sodium: 118 mEq/L — CL (ref 135–145)

## 2013-08-27 LAB — SODIUM, URINE, RANDOM: Sodium, Ur: 10 mEq/L

## 2013-08-27 MED ORDER — POTASSIUM CHLORIDE CRYS ER 20 MEQ PO TBCR
30.0000 meq | EXTENDED_RELEASE_TABLET | Freq: Once | ORAL | Status: AC
  Administered 2013-08-27: 30 meq via ORAL
  Filled 2013-08-27: qty 1

## 2013-08-27 MED ORDER — SODIUM CHLORIDE 0.9 % IV SOLN
INTRAVENOUS | Status: DC
  Administered 2013-08-27: 07:00:00 via INTRAVENOUS

## 2013-08-27 NOTE — Consult Note (Signed)
[facility] Medical Consultation  Jordan [name] WUJ:[id number] DOB: 1961/06/25 DOA: 08/25/2013 PCP: Gretel Acre, MD   Requesting physician: Dr. Turner Daniels Date of consultation: 08/26/13 Reason for consultation: Hyponatremia  HPI:  The patient is a 52yo female with a hx of OA resistant to conservative tx who presented to the surgical service for an elective R knee arthoplasty. Per a review of the order history, the patient was continued on D5 1/2NS on 9/12. An initial serum Na was noted to be normal at 136. Post-operatively, the patient was noted to have a serum sodium of 114 on 9/18. The patient was started on aggressive NS IVF. The hospitalist service was consulted for management for hyponatremia. When seen, the patient only complained of post-op LE pain and was otherwise asymptomatic.   Impression/Recommendations Principal Problem:   Hyponatremia Active Problems:   Arthritis of knee, left  1. Hyponatremia: - check urine Na. She is euvolemic, decrease IVF to [facility] this morning and allow her to eliminate free water. Has good po intake. Patient endorses drinking 2-3 jugs of ice water per day yesterday. Counseled to limit to herself to fluid restriction limits and decrease her free water intake to 2-3 cups if that. Continue to closely monitor. 2. S/p R knee arthoplasty - Per primary surgical service  I will followup again tomorrow. Please contact me if I can be of assistance in the meanwhile. Thank you for this consultation.  Physical Exam: Blood pressure 122/55, pulse 97, temperature 98.3 F (36.8 C), temperature source Oral, resp. rate 16, SpO2 96.00%. Filed Vitals:   08/26/13 1212 08/26/13 1342 08/26/13 2052 08/27/13 0551  BP: 147/64 129/68 142/61 122/55  Pulse: 86 92 92 97  Temp:  98.3 F (36.8 C) 99.1 F (37.3 C) 98.3 F (36.8 C)  TempSrc:      Resp:  16 16 16   SpO2:  100% 100% 96%     General:  Awake, in nad  Eyes: PERRL B  ENT: membranes moist, dentition  fair  Neck: trachea midline, neck supple  Cardiovascular: regular, s1, s2  Respiratory: normal resp effort, no wheezing  Abdomen: soft, nondistended  Skin: normal skin turgor, no abnormal skin lesions seen  Musculoskeletal: perfused, no clubbing or cyanosis, dressings in place over RLE  Psychiatric: mood/affect normal // no auditory/visual hallucinations  Neurologic: cn2-12 grossly intact, strength/sensation intact  Labs on Admission:  Basic Metabolic Panel:  Recent Labs Lab 08/26/13 0835  08/26/13 2254 08/27/13 0100 08/27/13 0300 08/27/13 0500 08/27/13 0950  NA 114*  < > 113* 114* 118* 120* 117*  K 4.0  --   --   --   --  3.2* 3.4*  CL 81*  --   --   --   --  84* 82*  CO2 20  --   --   --   --  24 20  GLUCOSE 167*  --   --   --   --  156* 146*  BUN 6  --   --   --   --  6 6  CREATININE 0.55  --   --   --   --  0.69 0.72  CALCIUM 8.7  --   --   --   --  9.1 9.3  < > = values in this interval not displayed.  CBC:  Recent Labs Lab 08/26/13 0605 08/27/13 0500  WBC 13.4* 13.1*  HGB 12.0 11.0*  HCT 34.2* 30.6*  MCV 92.4 88.4  PLT 235 216   Time spent:   Pamella Pert [facility] Pager [phone]  If 7PM-7AM, please contact night-coverage [email or web address] Password [facility] 08/27/2013, 2:08 PM

## 2013-08-27 NOTE — Progress Notes (Signed)
Paged MD, no new orders as of now.  He did remind staff for strict I&O's for patient. Nsg to continue to monitor for status changes.

## 2013-08-27 NOTE — Progress Notes (Signed)
Dr Elvera Lennox returned page, pt Na level 117.  Per MD orders, stop IV fluids and accurate I&O's

## 2013-08-27 NOTE — Progress Notes (Signed)
PATIENT ID: Jordan [name]  MRN: [id number]  DOB/AGE:  1961/06/09 / 52 y.o.  2 Days Post-Op Procedure(s) (LRB): TOTAL KNEE ARTHROPLASTY (Right)    PROGRESS NOTE Subjective: Patient is alert, oriented, no Nausea, no Vomiting, yes passing gas, no Bowel Movement. Taking PO sips. Denies SOB, Chest or Calf Pain. Using Incentive Spirometer, PAS in place. Ambulate WBAT, CPM 0-60 Patient reports pain as moderate  .    Objective: Vital signs in last 24 hours: Filed Vitals:   08/26/13 1212 08/26/13 1342 08/26/13 2052 08/27/13 0551  BP: 147/64 129/68 142/61 122/55  Pulse: 86 92 92 97  Temp:  98.3 F (36.8 C) 99.1 F (37.3 C) 98.3 F (36.8 C)  TempSrc:      Resp:  16 16 16   SpO2:  100% 100% 96%      Intake/Output from previous day: I/O last 3 completed shifts: In: 2971 [P.O.:700; I.V.:1771; IV Piggyback:500] Out: 2575 [Urine:2250; Emesis/NG output:50; Drains:275]   Intake/Output this shift:     LABORATORY DATA:  Recent Labs  08/26/13 0605  08/26/13 0835  08/27/13 0300 08/27/13 0500  WBC 13.4*  --   --   --   --  13.1*  HGB 12.0  --   --   --   --  11.0*  HCT 34.2*  --   --   --   --  30.6*  PLT 235  --   --   --   --  216  NA  --   --  114*  < > 118* 120*  K  --   --  4.0  --   --  3.2*  CL  --   --  81*  --   --  84*  CO2  --   --  20  --   --  24  BUN  --   --  6  --   --  6  CREATININE  --   --  0.55  --   --  0.69  GLUCOSE  --   --  167*  --   --  156*  CALCIUM  --   < > 8.7  --   --  9.1  < > = values in this interval not displayed.  Examination: Neurologically intact ABD soft Neurovascular intact Sensation intact distally Intact pulses distally Dorsiflexion/Plantar flexion intact Incision: scant drainage}  Assessment:   2 Days Post-Op Procedure(s) (LRB): TOTAL KNEE ARTHROPLASTY (Right) ADDITIONAL DIAGNOSIS:  Hyponatremia and Hypertension Pt followed by medicine for hyponatremia, was given 3% saline and planed to switch to normal slaine once sodium  reached 120.  Plan: PT/OT WBAT, CPM 5/hrs day until ROM 0-90 degrees, then D/C CPM DVT Prophylaxis:  SCDx72hrs, ASA 325 mg BID x 2 weeks DISCHARGE PLAN: Home, when pt passes PT and is cleared by medicine for hyponatreamia DISCHARGE NEEDS: HHPT, HHRN, CPM, Walker and 3-in-1 comode seat     Griselda Bramblett R 08/27/2013, 7:11 AM

## 2013-08-27 NOTE — Progress Notes (Signed)
CRITICAL VALUE ALERT  Critical value received: Sodium 117   Date of notification: 08/27/2013  Time of notification: 1108   Critical value read back:yes   Nurse who received alert: Barbarann Ehlers, RN  MD notified (1st page):Jenny Katrinka Blazing, [place]  Time of first page: 1112  MD notified (2nd page):Dr Red Christians  Time of second page:1121  Responding MD:  Dr Red Christians  Time MD responded:  1124

## 2013-08-27 NOTE — Progress Notes (Signed)
Critical value called to me via lab, paged MD, no new orders at this time. Na+ 119.  Nsg to continue to monitor for status changes.

## 2013-08-27 NOTE — Progress Notes (Signed)
CRITICAL VALUE ALERT  Critical value received:  1430   Date of notification:  08/27/2013  Time of notification:  1430  Critical value read back:yes  Nurse who received alert: John Giovanni, RN  MD notified (1st page):  Elvera Lennox  Time of first page:  1435  MD notified (2nd page):none   Time of second page:N/A  Responding MD:  Elvera Lennox   Time MD responded:  1438

## 2013-08-27 NOTE — Progress Notes (Signed)
Physical Therapy Treatment Patient Details Name: Jordan [name] MRN: [id number] DOB: 03-22-1961 Today's Date: 08/27/2013 Time: 7829-5621 PT Time Calculation (min): 16 min  PT Assessment / Plan / Recommendation  History of Present Illness Pt. admitted with OA of R knee and underwent TKA R knee.     PT Comments   Pt. With early fatigue this pm which limited her distance but continues to improve with PT.    Follow Up Recommendations  Home health PT;Supervision/Assistance - 24 hour;Supervision for mobility/OOB     Does the patient have the potential to tolerate intense rehabilitation     Barriers to Discharge        Equipment Recommendations  None recommended by PT    Recommendations for Other Services    Frequency 7X/week   Progress towards PT Goals Progress towards PT goals: Progressing toward goals  Plan Current plan remains appropriate    Precautions / Restrictions Precautions Precautions: Knee;Fall Restrictions Weight Bearing Restrictions: Yes RLE Weight Bearing: Weight bearing as tolerated   Pertinent Vitals/Pain See vitals tab     Mobility  Bed Mobility Bed Mobility: Supine to Sit;Sitting - Scoot to Edge of Bed;Sit to Supine Supine to Sit: 5: Supervision;HOB elevated Sitting - Scoot to Edge of Bed: 5: Supervision Sit to Supine: 4: Min assist Details for Bed Mobility Assistance: assist for R LE Transfers Transfers: Sit to Stand;Stand to Sit Sit to Stand: 5: Supervision;From bed;With upper extremity assist Stand to Sit: 5: Supervision;To bed;With upper extremity assist Details for Transfer Assistance: good technique and hand placement Ambulation/Gait Ambulation/Gait Assistance: 4: Min guard Ambulation Distance (Feet): 70 Feet Assistive device: Rolling walker Ambulation/Gait Assistance Details: Pt. needed reminders to attempt heel strike and to achieve foot flat with R foot during ambulation; min guard assist for safety; early fatigue this pm, Gait Pattern:  Step-to pattern    Exercises    PT Diagnosis:    PT Problem List:   PT Treatment Interventions:     PT Goals (current goals can now be found in the care plan section) Acute Rehab PT Goals Patient Stated Goal: home with husband's assist PT Goal Formulation: With patient Time For Goal Achievement: 09/02/13 Potential to Achieve Goals: Good  Visit Information  Last PT Received On: 08/27/13 Assistance Needed: +1 History of Present Illness: Pt. admitted with OA of R knee and underwent TKA R knee.      Subjective Data  Subjective: "feeling pretty good " Patient Stated Goal: home with husband's assist   Cognition  Cognition Arousal/Alertness: Awake/alert Behavior During Therapy: WFL for tasks assessed/performed Overall Cognitive Status: Within Functional Limits for tasks assessed    Balance     End of Session PT - End of Session Equipment Utilized During Treatment: Gait belt Activity Tolerance: Patient tolerated treatment well Patient left: in bed;with call bell/phone within reach Nurse Communication: Mobility status   GP     Ferman Hamming 08/27/2013, 3:46 PM Weldon Picking PT Acute Rehab Services [phone] Beeper [phone]

## 2013-08-27 NOTE — Progress Notes (Signed)
08/27/13 Patient set up with HHPT, HHOT, and HHRN with [facility] by MD office. Spoke with patient, no change in d/c plans. [employer] providing CPM, rolliing walker and 3N1. Jacquelynn Cree RN, BSN, CCM

## 2013-08-27 NOTE — Progress Notes (Signed)
Some of patients medications did not arrive to unit during scheduled time.  When they did, pt refused and stated "Ill take them tomorrow"  Nsg to continue to monitor for status changes.

## 2013-08-27 NOTE — Progress Notes (Signed)
Occupational Therapy Treatment Patient Details Name: Jordan [name] MRN: [id number] DOB: [birth year] Today's Date: 08/27/2013 Time: 1308-6578 OT Time Calculation (min): 20 min  OT Assessment / Plan / Recommendation  History of present illness Pt. admitted with OA of R knee and underwent TKA R knee.  Pt now with hyponatremia.   Pt and husband are knowledgeable in use of AE and DME for bathing, dressing and toileting. Will rely on husband to assist with dressing until pt is able to access her R foot. Did not practice shower transfer, but verbally instructed in technique and use of 3 in 1 as seat.  OT comments    Follow Up Recommendations  No OT follow up;Supervision/Assistance - 24 hour    Barriers to Discharge       Equipment Recommendations  None recommended by OT    Recommendations for Other Services    Frequency Min 2X/week   Progress towards OT Goals Progress towards OT goals: Progressing toward goals  Plan Discharge plan remains appropriate    Precautions / Restrictions Precautions Precautions: Knee;Fall   Pertinent Vitals/Pain 5/10, R knee, iced, repositioned    ADL  Grooming: Teeth care;Wash/dry face;Wash/dry hands;Set up Where Assessed - Grooming: Unsupported sitting Lower Body Bathing:  (instructed pt and husband in use of long sponge) Where Assessed - Lower Body Bathing: Unsupported sitting Lower Body Dressing:  (instructed pt and husband in use of reacher, sock aide) Where Assessed - Lower Body Dressing: Unsupported sitting;Supported sit to stand Toilet Transfer: Minimal assistance Toilet Transfer Method: Stand pivot Toilet Transfer Equipment: Bedside commode Toileting - Clothing Manipulation and Hygiene: Supervision/safety Where Assessed - Toileting Clothing Manipulation and Hygiene: Sit on 3-in-1 or toilet Equipment Used: Rolling walker;Gait belt;Reacher;Sock aid;Long-handled sponge;Long-handled shoe horn Transfers/Ambulation Related to ADLs: min assist,  transferred only ADL Comments: Pt will rely on husband for LB dressing, has a long sponge at home she will use.  Educated pt and husband verbally on shower stall transfer leading with strong leg and placement of walker.  Educated pt and husband in multiple uses of 3 in1 and placement in shower stall to allow room to extend knee.    OT Diagnosis:    OT Problem List:   OT Treatment Interventions:     OT Goals(current goals can now be found in the care plan section) Acute Rehab OT Goals Patient Stated Goal: home with husband's assist  Visit Information  Last OT Received On: 08/27/13 Assistance Needed: +2 (for ambulation) History of Present Illness: Pt. admitted with OA of R knee and underwent TKA R knee.      Subjective Data      Prior Functioning       Cognition  Cognition Arousal/Alertness: Awake/alert Behavior During Therapy: WFL for tasks assessed/performed Overall Cognitive Status: Within Functional Limits for tasks assessed    Mobility  Bed Mobility Bed Mobility: Supine to Sit;Sit to Supine Supine to Sit: 4: Min assist;HOB elevated Sit to Supine: 4: Min assist;HOB elevated Details for Bed Mobility Assistance: assist for R LE Transfers Sit to Stand: 4: Min assist;With upper extremity assist;From bed;From chair/3-in-1 Stand to Sit: 4: Min assist;With upper extremity assist;To bed;To chair/3-in-1    Exercises      Balance     End of Session OT - End of Session Activity Tolerance: Patient tolerated treatment well Patient left: in bed;with call bell/phone within reach;with family/visitor present;with nursing/sitter in room Nurse Communication:  (aware of drink given to pt, as pt is on strict I and O)  GO  Evern Bio 08/27/2013, 11:50 AM [phone]

## 2013-08-27 NOTE — Progress Notes (Signed)
Physical Therapy Treatment Patient Details Name: Jordan [name] MRN: [id number] DOB: Jan 09, 1961 Today's Date: 08/27/2013 Time: 8469-6295 PT Time Calculation (min): 25 min  PT Assessment / Plan / Recommendation  History of Present Illness Pt. admitted with OA of R knee and underwent TKA R knee.     PT Comments   Pt. feeling much better during session and was not bothered by nausea or vomiting. She was able to advance her ambulation and exercises.  Will progress her as she tolerates.    Follow Up Recommendations  Home health PT;Supervision/Assistance - 24 hour;Supervision for mobility/OOB     Does the patient have the potential to tolerate intense rehabilitation     Barriers to Discharge        Equipment Recommendations  None recommended by PT    Recommendations for Other Services    Frequency 7X/week   Progress towards PT Goals Progress towards PT goals: Progressing toward goals  Plan Current plan remains appropriate    Precautions / Restrictions Precautions Precautions: Knee;Fall Restrictions Weight Bearing Restrictions: Yes RLE Weight Bearing: Weight bearing as tolerated   Pertinent Vitals/Pain See vitals tab     Mobility  Bed Mobility Bed Mobility: Not assessed (pt. in recliner) Supine to Sit: 4: Min assist;HOB elevated Sit to Supine: 4: Min assist;HOB elevated Details for Bed Mobility Assistance: assist for R LE Transfers Transfers: Sit to Stand;Stand to Sit Sit to Stand: 4: Min guard;With upper extremity assist;With armrests;From chair/3-in-1 Stand to Sit: 4: Min guard;With upper extremity assist;With armrests;To chair/3-in-1 Details for Transfer Assistance: Pt. able to bring her shouilders forward independently in recliner today and scoot ot edge of cahir.  Min guard assist for safety in transitions of sit<>stand.  cues for technique Ambulation/Gait Ambulation/Gait Assistance: 4: Min assist;4: Min Government social research officer (Feet): 150 Feet Assistive device:  Rolling walker Ambulation/Gait Assistance Details: Initially pt. needed min assist then progressed to min guard assist level for safety.  Using good technique Gait Pattern: Step-to pattern    Exercises Total Joint Exercises Ankle Circles/Pumps: AROM;Both;10 reps;Seated Quad Sets: AROM;Both;10 reps;Seated Short Arc Quad: AAROM;Right;10 reps;Seated Straight Leg Raises: AAROM;Right;5 reps;Seated Goniometric ROM: -5 to 65   PT Diagnosis:    PT Problem List:   PT Treatment Interventions:     PT Goals (current goals can now be found in the care plan section) Acute Rehab PT Goals Patient Stated Goal: home with husband's assist PT Goal Formulation: With patient Time For Goal Achievement: 09/02/13 Potential to Achieve Goals: Good  Visit Information  Last PT Received On: 08/27/13 Assistance Needed: +1 History of Present Illness: Pt. admitted with OA of R knee and underwent TKA R knee.      Subjective Data  Subjective: Denies nausea and says she is feeling better today.  Reports he knee feels better than it did before surgery, even with post op pain Patient Stated Goal: home with husband's assist   Cognition  Cognition Arousal/Alertness: Awake/alert Behavior During Therapy: WFL for tasks assessed/performed Overall Cognitive Status: Within Functional Limits for tasks assessed    Balance     End of Session PT - End of Session Equipment Utilized During Treatment: Gait belt Activity Tolerance: Patient tolerated treatment well Patient left: in chair;with call bell/phone within reach Nurse Communication: Mobility status   GP     Ferman Hamming 08/27/2013, 12:36 PM Weldon Picking PT Acute Rehab Services [phone] Beeper [phone]

## 2013-08-27 NOTE — Progress Notes (Signed)
CRITICAL VALUE ALERT  Critical value received: Na+ 118  Date of notification:  08/27/2013  Time of notification: 1830   Critical value read back:yes   Nurse who received alert:  John Giovanni  MD notified (1st page): Dr Elvera Lennox  Time of first page: Paulo Fruit  MD notified (2nd page):  Time of second page:  Responding MD: Elvera Lennox   Time MD responded: [phone]

## 2013-08-28 LAB — BASIC METABOLIC PANEL
BUN: 13 mg/dL (ref 6–23)
BUN: 8 mg/dL (ref 6–23)
BUN: 9 mg/dL (ref 6–23)
CO2: 27 mEq/L (ref 19–32)
CO2: 27 mEq/L (ref 19–32)
CO2: 28 mEq/L (ref 19–32)
Calcium: 9 mg/dL (ref 8.4–10.5)
Calcium: 9.3 mg/dL (ref 8.4–10.5)
Calcium: 9.5 mg/dL (ref 8.4–10.5)
Chloride: 88 mEq/L — ABNORMAL LOW (ref 96–112)
Chloride: 89 mEq/L — ABNORMAL LOW (ref 96–112)
Chloride: 91 mEq/L — ABNORMAL LOW (ref 96–112)
Creatinine, Ser: 0.78 mg/dL (ref 0.50–1.10)
Creatinine, Ser: 0.81 mg/dL (ref 0.50–1.10)
Creatinine, Ser: 0.83 mg/dL (ref 0.50–1.10)
GFR calc Af Amer: >90 mL/min (ref >90)
GFR calc Af Amer: >90 mL/min (ref >90)
GFR calc Af Amer: >90 mL/min (ref >90)
GFR calc non Af Amer: 80 mL/min — ABNORMAL LOW (ref >90)
GFR calc non Af Amer: 82 mL/min — ABNORMAL LOW (ref >90)
GFR calc non Af Amer: >90 mL/min (ref >90)
Glucose, Bld: 114 mg/dL — ABNORMAL HIGH (ref 70–99)
Glucose, Bld: 124 mg/dL — ABNORMAL HIGH (ref 70–99)
Glucose, Bld: 147 mg/dL — ABNORMAL HIGH (ref 70–99)
Potassium: 3.7 mEq/L (ref 3.5–5.1)
Potassium: 3.9 mEq/L (ref 3.5–5.1)
Potassium: 3.9 mEq/L (ref 3.5–5.1)
Sodium: 125 mEq/L — ABNORMAL LOW (ref 135–145)
Sodium: 126 mEq/L — ABNORMAL LOW (ref 135–145)
Sodium: 126 mEq/L — ABNORMAL LOW (ref 135–145)

## 2013-08-28 NOTE — Consult Note (Signed)
[facility] Medical Consultation  Jordan [name] ZOX:[id number] DOB: 1961/06/25 DOA: 08/25/2013 PCP: Gretel Acre, MD   Requesting physician: Dr. Turner Daniels Date of consultation: 08/26/13 Reason for consultation: Hyponatremia  HPI:  The patient is a 52yo female with a hx of OA resistant to conservative tx who presented to the surgical service for an elective R knee arthoplasty. Per a review of the order history, the patient was continued on D5 1/2NS on 9/12. An initial serum Na was noted to be normal at 136. Post-operatively, the patient was noted to have a serum sodium of 114 on 9/18. The patient was started on aggressive NS IVF. The hospitalist service was consulted for management for hyponatremia. When seen, the patient only complained of post-op LE pain and was otherwise asymptomatic.   Impression/Recommendations Principal Problem:   Hyponatremia Active Problems:   Arthritis of knee, left  1. Hyponatremia: - She is euvolemic. Good UOP, Na levels trending up. For some reason, morning labs were not obtained, no explanation in chart. Asked nursing staff to kindly ask phlebotomy to draw labs again. Continue fluid restriction, good UOP. Once her Na reaches ~ 130 can potentially go home with close PCP follow up early next week. Will be probably OK to go home tomorrow if Na continues current trend.  Patient counseled again against large quantities of water intake at home.  2. S/p R knee arthoplasty - Per primary surgical service  I will follow patient. Please contact me if I can be of assistance in the meanwhile. Thank you for this consultation.  Physical Exam: Blood pressure 104/43, pulse 101, temperature 98.4 F (36.9 C), temperature source Oral, resp. rate 16, SpO2 100.00%. Filed Vitals:   08/27/13 1600 08/27/13 2107 08/28/13 0600 08/28/13 0731  BP:  114/52 104/43   Pulse:  103 101   Temp:  98.9 F (37.2 C) 98.4 F (36.9 C)   TempSrc:      Resp: 16 16 18 16   SpO2: 100% 97% 100%  100%     General:  Awake, in nad, walking in the hallway with PT  ENT: membranes moist  Neck: trachea midline, neck supple  Cardiovascular: regular, s1, s2  Respiratory: normal resp effort, no wheezing  Abdomen: soft, nondistended  Skin: normal skin turgor, no abnormal skin lesions seen  Musculoskeletal: perfused, no clubbing or cyanosis, dressings in place over RLE  Psychiatric: mood/affect normal // no auditory/visual hallucinations  Neurologic: cn2-12 grossly intact, strength/sensation intact  Labs on Admission:  Basic Metabolic Panel:  Recent Labs Lab 08/27/13 0950 08/27/13 1326 08/27/13 1653 08/27/13 2100 08/28/13  NA 117* 119* 118* 124* 126*  K 3.4* 3.9 3.2* 3.8 3.7  CL 82* 83* 82* 88* 91*  CO2 20 25 23 28 28   GLUCOSE 146* 131* 128* 148* 124*  BUN 6 6 6 7 8   CREATININE 0.72 0.68 0.64 0.73 0.83  CALCIUM 9.3 9.2 9.4 8.9 9.0    CBC:  Recent Labs Lab 08/26/13 0605 08/27/13 0500  WBC 13.4* 13.1*  HGB 12.0 11.0*  HCT 34.2* 30.6*  MCV 92.4 88.4  PLT 235 216   Time spent: 25 min  Pamella Pert [facility] Pager [phone]  If 7PM-7AM, please contact night-coverage [email or web address] Password [email or web address] 08/28/2013, 11:13 AM

## 2013-08-28 NOTE — Progress Notes (Signed)
Physical Therapy Treatment Patient Details Name: Jordan [name] MRN: [id number] DOB: Feb 25, 1961 Today's Date: 08/28/2013 Time: 4098-1191 PT Time Calculation (min): 24 min  PT Assessment / Plan / Recommendation  History of Present Illness Pt. admitted with OA of R knee and underwent TKA R knee.     PT Comments   Pt nervous about D/C to home, despite moving well.  Needs encouragement to increase gait distance and attempt to increase ROM.    Follow Up Recommendations  Home health PT;Supervision/Assistance - 24 hour;Supervision for mobility/OOB     Does the patient have the potential to tolerate intense rehabilitation     Barriers to Discharge        Equipment Recommendations  None recommended by PT    Recommendations for Other Services    Frequency 7X/week   Progress towards PT Goals Progress towards PT goals: Progressing toward goals  Plan Current plan remains appropriate    Precautions / Restrictions Precautions Precautions: Knee;Fall Restrictions Weight Bearing Restrictions: Yes RLE Weight Bearing: Weight bearing as tolerated   Pertinent Vitals/Pain Indicates "Better than it was"      Mobility  Bed Mobility Bed Mobility: Supine to Sit;Sitting - Scoot to Edge of Bed;Sit to Supine Supine to Sit: 4: Min assist;HOB flat Sitting - Scoot to Edge of Bed: 4: Min assist Sit to Supine: 5: Supervision Details for Bed Mobility Assistance: A with R LE only.  With return to bed worked on using blanket to act as leg lifter and pt able to return to bed with S.   Transfers Transfers: Sit to Stand;Stand to Sit Sit to Stand: 4: Min guard;With upper extremity assist;From bed Stand to Sit: 4: Min guard;With upper extremity assist Details for Transfer Assistance: Demos good use of UEs.   Ambulation/Gait Ambulation/Gait Assistance: 4: Min guard Ambulation Distance (Feet): 170 Feet Assistive device: Rolling walker Ambulation/Gait Assistance Details: cues for increased heel strike.    Gait Pattern: Step-through pattern;Decreased step length - left;Decreased stance time - right Stairs: No Wheelchair Mobility Wheelchair Mobility: No    Exercises Total Joint Exercises Long Arc Quad: AAROM;Right;10 reps Knee Flexion: AAROM;Right;10 reps Goniometric ROM: ~5-65   PT Diagnosis:    PT Problem List:   PT Treatment Interventions:     PT Goals (current goals can now be found in the care plan section) Acute Rehab PT Goals Time For Goal Achievement: 09/02/13 Potential to Achieve Goals: Good  Visit Information  Last PT Received On: 08/28/13 Assistance Needed: +1 History of Present Illness: Pt. admitted with OA of R knee and underwent TKA R knee.      Subjective Data      Cognition  Cognition Arousal/Alertness: Awake/alert Behavior During Therapy: WFL for tasks assessed/performed Overall Cognitive Status: Within Functional Limits for tasks assessed    Balance  Balance Balance Assessed: No  End of Session PT - End of Session Equipment Utilized During Treatment: Gait belt Activity Tolerance: Patient tolerated treatment well Patient left: in bed;with call bell/phone within reach Nurse Communication: Mobility status   GP     Sunny Schlein, [place] [phone] 08/28/2013, 2:23 PM

## 2013-08-28 NOTE — Progress Notes (Signed)
PATIENT ID: Jordan [name]  MRN: [id number]  DOB/AGE:  [birth year] / 52 y.o.  3 Days Post-Op Procedure(s) (LRB): TOTAL KNEE ARTHROPLASTY (Right)    PROGRESS NOTE Subjective: Patient is alert, oriented, no Nausea, no Vomiting, yes passing gas, no Bowel Movement. Taking PO well, pt up in bed. Denies SOB, Chest or Calf Pain. Using Incentive Spirometer, PAS in place. Ambulate WBAT, CPM 0-60 Patient reports pain as moderate  .    Objective: Vital signs in last 24 hours: Filed Vitals:   08/27/13 1400 08/27/13 1600 08/27/13 2107 08/28/13 0600  BP: 126/61  114/52 104/43  Pulse: 102  103 101  Temp: 99.2 F (37.3 C)  98.9 F (37.2 C) 98.4 F (36.9 C)  TempSrc:      Resp: 15 16 16 18   SpO2: 100% 100% 97% 100%      Intake/Output from previous day: I/O last 3 completed shifts: In: 1116 [P.O.:720; I.V.:396] Out: 2250 [Urine:2250]   Intake/Output this shift:     LABORATORY DATA:  Recent Labs  08/26/13 0605  08/27/13 0500  08/27/13 2100 08/28/13  WBC 13.4*  --  13.1*  --   --   --   HGB 12.0  --  11.0*  --   --   --   HCT 34.2*  --  30.6*  --   --   --   PLT 235  --  216  --   --   --   NA  --   < > 120*  < > 124* 126*  K  --   < > 3.2*  < > 3.8 3.7  CL  --   < > 84*  < > 88* 91*  CO2  --   < > 24  < > 28 28  BUN  --   < > 6  < > 7 8  CREATININE  --   < > 0.69  < > 0.73 0.83  GLUCOSE  --   < > 156*  < > 148* 124*  CALCIUM  --   < > 9.1  < > 8.9 9.0  < > = values in this interval not displayed.  Examination: Neurologically intact Neurovascular intact Sensation intact distally Intact pulses distally Dorsiflexion/Plantar flexion intact Incision: dressing C/D/I Compartment soft}  Assessment:   3 Days Post-Op Procedure(s) (LRB): TOTAL KNEE ARTHROPLASTY (Right) ADDITIONAL DIAGNOSIS:  Hypertension and hyponatremia Pt is being followed by medicine for the hyponatremia.  It looks like her counts are trending up with most recent level at 126  Plan: PT/OT WBAT, CPM 5/hrs  day until ROM 0-90 degrees, then D/C CPM DVT Prophylaxis:  SCDx72hrs, ASA 325 mg BID x 2 weeks DISCHARGE PLAN: Home when pt passes PT and is cleared for the hyponatremia. DISCHARGE NEEDS: HHPT, HHRN, CPM, Walker and 3-in-1 comode seat     Domonik Levario R 08/28/2013, 7:27 AM

## 2013-08-28 NOTE — Progress Notes (Signed)
Clinical Child psychotherapist (CSW) met with patient and husband who was at bedside to discuss short term rehab in a facility. Patient confirmed that she was going home with home health and her husband would provide 24 hour supervision for her. Patient asked about equipment to help her take a bath CSW encourged patient to speak with case manager about all home health equipment. Please reconsult if further needs arise. CSW signing off.  Jetta Lout, LCSWA Weekend CSW [phone]

## 2013-08-29 DIAGNOSIS — Z96659 Presence of unspecified artificial knee joint: Secondary | ICD-10-CM

## 2013-08-29 LAB — BASIC METABOLIC PANEL
BUN: 10 mg/dL (ref 6–23)
CO2: 26 mEq/L (ref 19–32)
Calcium: 9.7 mg/dL (ref 8.4–10.5)
Chloride: 88 mEq/L — ABNORMAL LOW (ref 96–112)
Creatinine, Ser: 0.71 mg/dL (ref 0.50–1.10)
GFR calc Af Amer: >90 mL/min (ref >90)
GFR calc non Af Amer: >90 mL/min (ref >90)
Glucose, Bld: 102 mg/dL — ABNORMAL HIGH (ref 70–99)
Potassium: 3.8 mEq/L (ref 3.5–5.1)
Sodium: 126 mEq/L — ABNORMAL LOW (ref 135–145)

## 2013-08-29 NOTE — Progress Notes (Signed)
Physical Therapy Treatment Patient Details Name: Jordan [name] MRN: [id number] DOB: 08-30-1961 Today's Date: 08/29/2013 Time: 4098-1191 PT Time Calculation (min): 18 min  PT Assessment / Plan / Recommendation  History of Present Illness Pt. admitted with OA of R knee and underwent TKA R knee.     PT Comments   Patient did well with mobility and ambulation today.  Has achieved all PT goals.  Ready for discharge from PT perspective.  Follow Up Recommendations  Home health PT;Supervision/Assistance - 24 hour     Does the patient have the potential to tolerate intense rehabilitation     Barriers to Discharge        Equipment Recommendations  None recommended by PT    Recommendations for Other Services    Frequency 7X/week   Progress towards PT Goals Progress towards PT goals: Goals met/education completed, patient discharged from PT  Plan Current plan remains appropriate    Precautions / Restrictions Precautions Precautions: Knee Restrictions Weight Bearing Restrictions: Yes RLE Weight Bearing: Weight bearing as tolerated   Pertinent Vitals/Pain     Mobility  Bed Mobility Bed Mobility: Supine to Sit;Sit to Supine Supine to Sit: 6: Modified independent (Device/Increase time);HOB flat (with sheet to move RLE) Sit to Supine: 6: Modified independent (Device/Increase time);HOB flat (with sheet to move RLE) Details for Bed Mobility Assistance: Patient using sheet around foot to move RLE off of and onto bed.  Able to perform independently. Transfers Transfers: Sit to Stand;Stand to Sit Sit to Stand: 6: Modified independent (Device/Increase time);With upper extremity assist;From bed Stand to Sit: 6: Modified independent (Device/Increase time);With upper extremity assist;To bed Details for Transfer Assistance: Patient uses appropriate technique Ambulation/Gait Ambulation/Gait Assistance: 6: Modified independent (Device/Increase time) Ambulation Distance (Feet): 210  Feet Assistive device: Rolling walker Ambulation/Gait Assistance Details: Patient using RW safely and using correct gait sequnece. Gait Pattern: Step-through pattern;Decreased step length - left;Decreased stance time - right Gait velocity: Slow gait speed      PT Goals (current goals can now be found in the care plan section)    Visit Information  Last PT Received On: 08/29/13 Assistance Needed: +1 History of Present Illness: Pt. admitted with OA of R knee and underwent TKA R knee.      Subjective Data  Subjective: Ready to go home   Cognition  Cognition Arousal/Alertness: Awake/alert Behavior During Therapy: WFL for tasks assessed/performed Overall Cognitive Status: Within Functional Limits for tasks assessed    Balance     End of Session PT - End of Session Activity Tolerance: Patient tolerated treatment well Patient left: in bed;with call bell/phone within reach;with family/visitor present Nurse Communication: Mobility status   GP     Vena Austria 08/29/2013, 4:46 PM Durenda Hurt. Renaldo Fiddler, [facility] Acute Rehab Services Pager [phone]

## 2013-08-29 NOTE — Progress Notes (Signed)
Discharge instructions given. Patient verbalized understanding and all questions were answered.  ?

## 2013-08-29 NOTE — Progress Notes (Signed)
PATIENT ID: Jordan [name]  MRN: [id number]  DOB/AGE:  1961-07-31 / 52 y.o.  4 Days Post-Op Procedure(s) (LRB): TOTAL KNEE ARTHROPLASTY (Right)    PROGRESS NOTE Subjective: Patient is alert, oriented, no Nausea, no Vomiting, yes passing gas, no Bowel Movement. Taking PO well, pt up eating in room. Denies SOB, Chest or Calf Pain. Using Incentive Spirometer, PAS in place. Ambulate WBAT, CPM 0-60 Patient reports pain as mild  .    Objective: Vital signs in last 24 hours: Filed Vitals:   08/28/13 1550 08/28/13 1621 08/28/13 2200 08/29/13 0449  BP:   120/53 125/49  Pulse:   96 105  Temp:   99.3 F (37.4 C) 99.9 F (37.7 C)  TempSrc:      Resp: 18  16 16   Height:  5\' 6"  (1.676 m)    Weight:  76 kg (167 lb 8.8 oz)    SpO2: 100%  98% 95%      Intake/Output from previous day: I/O last 3 completed shifts: In: 2160 [P.O.:2160] Out: 1575 [Urine:1575]   Intake/Output this shift: Total I/O In: 480 [P.O.:480] Out: -    LABORATORY DATA:  Recent Labs  08/27/13 0500  08/28/13 1200 08/28/13 1940  WBC 13.1*  --   --   --   HGB 11.0*  --   --   --   HCT 30.6*  --   --   --   PLT 216  --   --   --   NA 120*  < > 126* 125*  K 3.2*  < > 3.9 3.9  CL 84*  < > 89* 88*  CO2 24  < > 27 27  BUN 6  < > 9 13  CREATININE 0.69  < > 0.81 0.78  GLUCOSE 156*  < > 147* 114*  CALCIUM 9.1  < > 9.5 9.3  < > = values in this interval not displayed.  Examination: Neurologically intact Neurovascular intact Sensation intact distally Intact pulses distally Dorsiflexion/Plantar flexion intact Incision: dressing C/D/I Compartment soft}  Assessment:   4 Days Post-Op Procedure(s) (LRB): TOTAL KNEE ARTHROPLASTY (Right) ADDITIONAL DIAGNOSIS:  Hypertension and hyponatremia Pt followed by medicine for hyponatremia.    Plan: PT/OT WBAT, CPM 5/hrs day until ROM 0-90 degrees, then D/C CPM DVT Prophylaxis:  SCDx72hrs, ASA 325 mg BID x 2 weeks DISCHARGE PLAN: Home when pt passes PT and per medicine  the NA level reaches 130. DISCHARGE NEEDS: HHPT, HHRN, CPM, Walker and 3-in-1 comode seat     Daisi Kentner R 08/29/2013, 8:50 AM

## 2013-08-29 NOTE — Discharge Summary (Signed)
Patient ID: Jordan [name] MRN: [id number] DOB/AGE: 1961/03/31 52 y.o.  Admit date: 08/25/2013 Discharge date: 08/29/2013  Admission Diagnoses:  Principal Problem:   Hyponatremia Active Problems:   Arthritis of knee, left   Discharge Diagnoses:  Same  Past Medical History  Diagnosis Date  . MVP (mitral valve prolapse)   . Hypertension   . Arthritis     Surgeries: Procedure(s): TOTAL KNEE ARTHROPLASTY on 08/25/2013   Consultants: Treatment Team:  Mahala Menghini, MD  Discharged Condition: Improved  Hospital Course: Jordan [name] is an 52 y.o. female who was admitted 08/25/2013 for operative treatment ofHyponatremia. Patient has severe unremitting pain that affects sleep, daily activities, and work/hobbies. After pre-op clearance the patient was taken to the operating room on 08/25/2013 and underwent  Procedure(s): TOTAL KNEE ARTHROPLASTY.    Patient was given perioperative antibiotics: Anti-infectives   Start     Dose/Rate Route Frequency Ordered Stop   08/25/13 0600  ceFAZolin (ANCEF) IVPB 2 g/50 mL premix     2 g 100 mL/hr over 30 Minutes Intravenous On call to O.R. 08/24/13 1410 08/25/13 1315       Patient was given sequential compression devices, early ambulation, and chemoprophylaxis to prevent DVT.  Patient benefited maximally from hospital stay and there were no complications.    Recent vital signs: Patient Vitals for the past 24 hrs:  BP Temp Pulse Resp SpO2 Height Weight  08/29/13 0449 125/49 mmHg 99.9 F (37.7 C) 105 16 95 % - -  08/28/13 2200 120/53 mmHg 99.3 F (37.4 C) 96 16 98 % - -  08/28/13 1621 - - - - - 5\' 6"  (1.676 m) 76 kg (167 lb 8.8 oz)  08/28/13 1550 - - - 18 100 % - -  08/28/13 1319 90/50 mmHg 99.1 F (37.3 C) 104 18 100 % - -  08/28/13 1142 - - - 16 100 % - -     Recent laboratory studies:  Recent Labs  08/27/13 0500  08/28/13 1200 08/28/13 1940  WBC 13.1*  --   --   --   HGB 11.0*  --   --   --   HCT 30.6*  --   --   --   PLT  216  --   --   --   NA 120*  < > 126* 125*  K 3.2*  < > 3.9 3.9  CL 84*  < > 89* 88*  CO2 24  < > 27 27  BUN 6  < > 9 13  CREATININE 0.69  < > 0.81 0.78  GLUCOSE 156*  < > 147* 114*  CALCIUM 9.1  < > 9.5 9.3  < > = values in this interval not displayed.   Discharge Medications:     Medication List    STOP taking these medications       HYDROcodone-acetaminophen 5-325 MG per tablet  Commonly known as:  NORCO/VICODIN      TAKE these medications       amLODipine 2.5 MG tablet  Commonly known as:  NORVASC  Take 2.5 mg by mouth daily.     aspirin EC 325 MG tablet  Take 1 tablet (325 mg total) by mouth 2 (two) times daily.     CALCIUM PO  Take 1 tablet by mouth daily.     fish oil-omega-3 fatty acids 1000 MG capsule  Take 1 g by mouth daily.     lisinopril-hydrochlorothiazide 20-25 MG per tablet  Commonly known as:  PRINZIDE,ZESTORETIC  Take 1 tablet by mouth daily.     methocarbamol 500 MG tablet  Commonly known as:  ROBAXIN  Take 1 tablet (500 mg total) by mouth 2 (two) times daily with a meal.     multivitamin with minerals tablet  Take 1 tablet by mouth daily.     oxyCODONE-acetaminophen 5-325 MG per tablet  Commonly known as:  ROXICET  Take 1 tablet by mouth every 4 (four) hours as needed for pain.     varenicline 0.5 MG tablet  Commonly known as:  CHANTIX  Take 0.5 mg by mouth daily.     venlafaxine XR 75 MG 24 hr capsule  Commonly known as:  EFFEXOR-XR  Take 150 mg by mouth daily.        Diagnostic Studies: Dg Chest 2 View  08/20/2013   CLINICAL DATA:  Preoperative evaluation for knee surgery, history hypertension  EXAM: CHEST  2 VIEW  COMPARISON:  03/19/2007  FINDINGS: Normal heart size, mediastinal contours, and pulmonary vascularity.  Mildly tortuous thoracic aorta.  Lungs slightly hyperinflated but clear.  No pleural effusion or pneumothorax.  Prior cervical spine fusion.  IMPRESSION: Slightly hyperinflated lungs without acute infiltrate.    Electronically Signed   By: Ulyses Southward M.D.   On: 08/20/2013 13:02    Disposition:       Discharge Orders   Future Orders Complete By Expires   Call MD / Call 911  As directed    Comments:     If you experience chest pain or shortness of breath, CALL 911 and be transported to the hospital emergency room.  If you develope a fever above 101 F, pus (white drainage) or increased drainage or redness at the wound, or calf pain, call your surgeon's office.   Change dressing  As directed    Comments:     Change dressing on 5, then change the dressing daily with sterile 4 x 4 inch gauze dressing and apply TED hose.  You may clean the incision with alcohol prior to redressing.   Constipation Prevention  As directed    Comments:     Drink plenty of fluids.  Prune juice may be helpful.  You may use a stool softener, such as Colace (over the counter) 100 mg twice a day.  Use MiraLax (over the counter) for constipation as needed.   CPM  As directed    Comments:     Continuous passive motion machine (CPM):      Use the CPM from 0 to 60  for 5 hours per day.      You may increase by 10 degrees per day.  You may break it up into 2 or 3 sessions per day.      Use CPM for 2 weeks or until you are told to stop.   Diet - low sodium heart healthy  As directed    Discharge instructions  As directed    Comments:     Follow up in office with Dr. Turner Daniels in 2 weeks.   Driving restrictions  As directed    Comments:     No driving for 2 weeks   Increase activity slowly as tolerated  As directed    Patient may shower  As directed    Comments:     You may shower without a dressing once there is no drainage.  Do not wash over the wound.  If drainage remains, cover wound with plastic wrap and then shower.  Follow-up Information   Follow up with Nestor Lewandowsky, MD In 2 weeks.   Specialty:  Orthopedic Surgery   Contact information:   [address] [place] [address] [phone]         Signed: Vear Clock, Shylo Dillenbeck R 08/29/2013, 9:02 AM

## 2016-08-22 DIAGNOSIS — Z23 Encounter for immunization: Secondary | ICD-10-CM | POA: Diagnosis not present

## 2016-08-22 DIAGNOSIS — Z0000 Encounter for general adult medical examination without abnormal findings: Secondary | ICD-10-CM | POA: Diagnosis not present

## 2016-08-23 DIAGNOSIS — E781 Pure hyperglyceridemia: Secondary | ICD-10-CM | POA: Diagnosis not present

## 2016-08-23 DIAGNOSIS — Z1159 Encounter for screening for other viral diseases: Secondary | ICD-10-CM | POA: Diagnosis not present

## 2016-08-23 DIAGNOSIS — R739 Hyperglycemia, unspecified: Secondary | ICD-10-CM | POA: Diagnosis not present

## 2016-08-23 DIAGNOSIS — Z0000 Encounter for general adult medical examination without abnormal findings: Secondary | ICD-10-CM | POA: Diagnosis not present

## 2016-08-28 ENCOUNTER — Other Ambulatory Visit: Payer: Self-pay | Admitting: Family Medicine

## 2016-08-28 DIAGNOSIS — Z1231 Encounter for screening mammogram for malignant neoplasm of breast: Secondary | ICD-10-CM

## 2016-09-16 ENCOUNTER — Ambulatory Visit
Admission: RE | Admit: 2016-09-16 | Discharge: 2016-09-16 | Disposition: A | Discharge status: Home / Self Care | Payer: Federal, State, Local not specified - PPO | Source: Ambulatory Visit | Attending: Family Medicine | Admitting: Family Medicine

## 2016-09-16 DIAGNOSIS — M8588 Other specified disorders of bone density and structure, other site: Secondary | ICD-10-CM | POA: Diagnosis not present

## 2016-09-16 DIAGNOSIS — Z1231 Encounter for screening mammogram for malignant neoplasm of breast: Secondary | ICD-10-CM | POA: Diagnosis not present

## 2016-09-16 DIAGNOSIS — E2839 Other primary ovarian failure: Secondary | ICD-10-CM | POA: Diagnosis not present

## 2016-12-09 DIAGNOSIS — I499 Cardiac arrhythmia, unspecified: Secondary | ICD-10-CM

## 2016-12-09 DIAGNOSIS — I509 Heart failure, unspecified: Secondary | ICD-10-CM

## 2016-12-09 HISTORY — DX: Cardiac arrhythmia, unspecified: I49.9

## 2016-12-09 HISTORY — DX: Heart failure, unspecified: I50.9

## 2017-03-27 DIAGNOSIS — M7710 Lateral epicondylitis, unspecified elbow: Secondary | ICD-10-CM | POA: Diagnosis not present

## 2017-08-29 DIAGNOSIS — R739 Hyperglycemia, unspecified: Secondary | ICD-10-CM | POA: Diagnosis not present

## 2017-08-29 DIAGNOSIS — N951 Menopausal and female climacteric states: Secondary | ICD-10-CM | POA: Diagnosis not present

## 2017-08-29 DIAGNOSIS — Z23 Encounter for immunization: Secondary | ICD-10-CM | POA: Diagnosis not present

## 2017-08-29 DIAGNOSIS — I10 Essential (primary) hypertension: Secondary | ICD-10-CM | POA: Diagnosis not present

## 2017-08-29 DIAGNOSIS — Z0000 Encounter for general adult medical examination without abnormal findings: Secondary | ICD-10-CM | POA: Diagnosis not present

## 2017-08-29 DIAGNOSIS — E781 Pure hyperglyceridemia: Secondary | ICD-10-CM | POA: Diagnosis not present

## 2017-09-19 DIAGNOSIS — E875 Hyperkalemia: Secondary | ICD-10-CM | POA: Diagnosis not present

## 2017-10-28 DIAGNOSIS — J209 Acute bronchitis, unspecified: Secondary | ICD-10-CM | POA: Diagnosis not present

## 2017-12-31 DIAGNOSIS — J988 Other specified respiratory disorders: Secondary | ICD-10-CM | POA: Diagnosis not present

## 2018-01-06 DIAGNOSIS — J988 Other specified respiratory disorders: Secondary | ICD-10-CM | POA: Diagnosis not present

## 2018-01-06 DIAGNOSIS — J029 Acute pharyngitis, unspecified: Secondary | ICD-10-CM | POA: Diagnosis not present

## 2018-01-16 DIAGNOSIS — R0602 Shortness of breath: Secondary | ICD-10-CM | POA: Diagnosis not present

## 2018-01-16 DIAGNOSIS — J90 Pleural effusion, not elsewhere classified: Secondary | ICD-10-CM | POA: Diagnosis not present

## 2018-01-16 DIAGNOSIS — R0989 Other specified symptoms and signs involving the circulatory and respiratory systems: Secondary | ICD-10-CM | POA: Diagnosis not present

## 2018-01-16 DIAGNOSIS — J069 Acute upper respiratory infection, unspecified: Secondary | ICD-10-CM | POA: Diagnosis not present

## 2018-02-24 DIAGNOSIS — J069 Acute upper respiratory infection, unspecified: Secondary | ICD-10-CM | POA: Diagnosis not present

## 2018-02-24 DIAGNOSIS — J45909 Unspecified asthma, uncomplicated: Secondary | ICD-10-CM | POA: Diagnosis not present

## 2018-02-27 ENCOUNTER — Other Ambulatory Visit (HOSPITAL_COMMUNITY): Payer: Self-pay | Admitting: Family Medicine

## 2018-02-27 DIAGNOSIS — R0602 Shortness of breath: Secondary | ICD-10-CM | POA: Diagnosis not present

## 2018-02-27 DIAGNOSIS — R739 Hyperglycemia, unspecified: Secondary | ICD-10-CM | POA: Diagnosis not present

## 2018-02-27 DIAGNOSIS — E781 Pure hyperglyceridemia: Secondary | ICD-10-CM | POA: Diagnosis not present

## 2018-02-27 DIAGNOSIS — I517 Cardiomegaly: Secondary | ICD-10-CM | POA: Diagnosis not present

## 2018-02-27 DIAGNOSIS — I10 Essential (primary) hypertension: Secondary | ICD-10-CM | POA: Diagnosis not present

## 2018-03-02 DIAGNOSIS — I517 Cardiomegaly: Secondary | ICD-10-CM

## 2018-03-02 DIAGNOSIS — I341 Nonrheumatic mitral (valve) prolapse: Secondary | ICD-10-CM | POA: Insufficient documentation

## 2018-03-02 DIAGNOSIS — I10 Essential (primary) hypertension: Secondary | ICD-10-CM | POA: Insufficient documentation

## 2018-03-02 DIAGNOSIS — J90 Pleural effusion, not elsewhere classified: Secondary | ICD-10-CM | POA: Insufficient documentation

## 2018-03-02 DIAGNOSIS — R0602 Shortness of breath: Secondary | ICD-10-CM | POA: Insufficient documentation

## 2018-03-02 HISTORY — DX: Cardiomegaly: I51.7

## 2018-03-04 ENCOUNTER — Emergency Department (HOSPITAL_COMMUNITY): Payer: Federal, State, Local not specified - PPO

## 2018-03-04 ENCOUNTER — Encounter (HOSPITAL_COMMUNITY): Payer: Self-pay

## 2018-03-04 ENCOUNTER — Other Ambulatory Visit: Payer: Self-pay

## 2018-03-04 ENCOUNTER — Inpatient Hospital Stay (HOSPITAL_COMMUNITY)
Admission: EM | Admit: 2018-03-04 | Discharge: 2018-03-07 | DRG: 291 | Disposition: A | Discharge status: Home / Self Care | Payer: Federal, State, Local not specified - PPO | Attending: Interventional Cardiology | Admitting: Interventional Cardiology

## 2018-03-04 DIAGNOSIS — R05 Cough: Secondary | ICD-10-CM | POA: Diagnosis not present

## 2018-03-04 DIAGNOSIS — I5023 Acute on chronic systolic (congestive) heart failure: Secondary | ICD-10-CM | POA: Diagnosis present

## 2018-03-04 DIAGNOSIS — I130 Hypertensive heart and chronic kidney disease with heart failure and stage 1 through stage 4 chronic kidney disease, or unspecified chronic kidney disease: Principal | ICD-10-CM | POA: Diagnosis present

## 2018-03-04 DIAGNOSIS — E785 Hyperlipidemia, unspecified: Secondary | ICD-10-CM | POA: Diagnosis not present

## 2018-03-04 DIAGNOSIS — Z888 Allergy status to other drugs, medicaments and biological substances status: Secondary | ICD-10-CM

## 2018-03-04 DIAGNOSIS — Z7982 Long term (current) use of aspirin: Secondary | ICD-10-CM

## 2018-03-04 DIAGNOSIS — I5022 Chronic systolic (congestive) heart failure: Secondary | ICD-10-CM

## 2018-03-04 DIAGNOSIS — M1712 Unilateral primary osteoarthritis, left knee: Secondary | ICD-10-CM | POA: Diagnosis present

## 2018-03-04 DIAGNOSIS — F17210 Nicotine dependence, cigarettes, uncomplicated: Secondary | ICD-10-CM | POA: Diagnosis not present

## 2018-03-04 DIAGNOSIS — I10 Essential (primary) hypertension: Secondary | ICD-10-CM | POA: Diagnosis present

## 2018-03-04 DIAGNOSIS — E875 Hyperkalemia: Secondary | ICD-10-CM | POA: Diagnosis not present

## 2018-03-04 DIAGNOSIS — N183 Chronic kidney disease, stage 3 (moderate): Secondary | ICD-10-CM | POA: Diagnosis not present

## 2018-03-04 DIAGNOSIS — Z23 Encounter for immunization: Secondary | ICD-10-CM | POA: Diagnosis not present

## 2018-03-04 DIAGNOSIS — I509 Heart failure, unspecified: Secondary | ICD-10-CM | POA: Diagnosis present

## 2018-03-04 DIAGNOSIS — Z96651 Presence of right artificial knee joint: Secondary | ICD-10-CM | POA: Diagnosis present

## 2018-03-04 DIAGNOSIS — R0602 Shortness of breath: Secondary | ICD-10-CM | POA: Diagnosis not present

## 2018-03-04 DIAGNOSIS — I361 Nonrheumatic tricuspid (valve) insufficiency: Secondary | ICD-10-CM | POA: Diagnosis not present

## 2018-03-04 DIAGNOSIS — I110 Hypertensive heart disease with heart failure: Secondary | ICD-10-CM | POA: Diagnosis not present

## 2018-03-04 DIAGNOSIS — Z8249 Family history of ischemic heart disease and other diseases of the circulatory system: Secondary | ICD-10-CM

## 2018-03-04 DIAGNOSIS — I5021 Acute systolic (congestive) heart failure: Secondary | ICD-10-CM | POA: Diagnosis not present

## 2018-03-04 DIAGNOSIS — Z79899 Other long term (current) drug therapy: Secondary | ICD-10-CM

## 2018-03-04 DIAGNOSIS — R079 Chest pain, unspecified: Secondary | ICD-10-CM | POA: Diagnosis not present

## 2018-03-04 DIAGNOSIS — I209 Angina pectoris, unspecified: Secondary | ICD-10-CM | POA: Diagnosis not present

## 2018-03-04 HISTORY — DX: Tobacco use: Z72.0

## 2018-03-04 HISTORY — DX: Prediabetes: R73.03

## 2018-03-04 LAB — BASIC METABOLIC PANEL
Anion gap: 14 (ref 5–15)
BUN: 18 mg/dL (ref 6–20)
CO2: 16 mmol/L — ABNORMAL LOW (ref 22–32)
Calcium: 9.6 mg/dL (ref 8.9–10.3)
Chloride: 106 mmol/L (ref 101–111)
Creatinine, Ser: 0.99 mg/dL (ref 0.44–1.00)
GFR calc Af Amer: >60 mL/min (ref >60)
GFR calc non Af Amer: >60 mL/min (ref >60)
Glucose, Bld: 128 mg/dL — ABNORMAL HIGH (ref 65–99)
Potassium: 5.2 mmol/L — ABNORMAL HIGH (ref 3.5–5.1)
Sodium: 136 mmol/L (ref 135–145)

## 2018-03-04 LAB — URINALYSIS, ROUTINE W REFLEX MICROSCOPIC
Glucose, UA: NEGATIVE mg/dL
Ketones, ur: NEGATIVE mg/dL
Nitrite: NEGATIVE
Protein, ur: 100 mg/dL — AB
Specific Gravity, Urine: 1.027 (ref 1.005–1.030)
pH: 5 (ref 5.0–8.0)

## 2018-03-04 LAB — BLOOD GAS, VENOUS

## 2018-03-04 LAB — HEPATIC FUNCTION PANEL
ALT: 64 U/L — ABNORMAL HIGH (ref 14–54)
AST: 60 U/L — ABNORMAL HIGH (ref 15–41)
Albumin: 4.3 g/dL (ref 3.5–5.0)
Alkaline Phosphatase: 107 U/L (ref 38–126)
Bilirubin, Direct: 0.1 mg/dL (ref 0.1–0.5)
Indirect Bilirubin: 0.7 mg/dL (ref 0.3–0.9)
Total Bilirubin: 0.8 mg/dL (ref 0.3–1.2)
Total Protein: 7.6 g/dL (ref 6.5–8.1)

## 2018-03-04 LAB — T4, FREE: Free T4: 1.12 ng/dL (ref 0.61–1.12)

## 2018-03-04 LAB — I-STAT VENOUS BLOOD GAS, ED
Acid-Base Excess: 1 mmol/L (ref 0.0–2.0)
Bicarbonate: 26.1 mmol/L (ref 20.0–28.0)
O2 Saturation: 30 %
TCO2: 27 mmol/L (ref 22–32)
pCO2, Ven: 44.2 mmHg (ref 44.0–60.0)
pH, Ven: 7.379 (ref 7.250–7.430)
pO2, Ven: 20 mmHg — CL (ref 32.0–45.0)

## 2018-03-04 LAB — CBC WITH DIFFERENTIAL/PLATELET
Basophils Absolute: 0 10*3/uL (ref 0.0–0.1)
Basophils Relative: 0 %
Eosinophils Absolute: 0.1 10*3/uL (ref 0.0–0.7)
Eosinophils Relative: 1 %
HCT: 42.5 % (ref 36.0–46.0)
Hemoglobin: 13.9 g/dL (ref 12.0–15.0)
Lymphocytes Relative: 18 %
Lymphs Abs: 1.3 10*3/uL (ref 0.7–4.0)
MCH: 31.2 pg (ref 26.0–34.0)
MCHC: 32.7 g/dL (ref 30.0–36.0)
MCV: 95.5 fL (ref 78.0–100.0)
Monocytes Absolute: 0.3 10*3/uL (ref 0.1–1.0)
Monocytes Relative: 5 %
Neutro Abs: 5.2 10*3/uL (ref 1.7–7.7)
Neutrophils Relative %: 76 %
Platelets: 246 10*3/uL (ref 150–400)
RBC: 4.45 MIL/uL (ref 3.87–5.11)
RDW: 13.5 % (ref 11.5–15.5)
WBC: 6.9 10*3/uL (ref 4.0–10.5)

## 2018-03-04 LAB — I-STAT CG4 LACTIC ACID, ED
Lactic Acid, Venous: 2.18 mmol/L (ref 0.5–1.9)
Lactic Acid, Venous: 1.14 mmol/L (ref 0.5–1.9)

## 2018-03-04 LAB — POTASSIUM: Potassium: 4.2 mmol/L (ref 3.5–5.1)

## 2018-03-04 LAB — CREATININE, SERUM
Creatinine, Ser: 1.09 mg/dL — ABNORMAL HIGH (ref 0.44–1.00)
GFR calc Af Amer: >60 mL/min (ref >60)
GFR calc non Af Amer: 56 mL/min — ABNORMAL LOW (ref >60)

## 2018-03-04 LAB — BRAIN NATRIURETIC PEPTIDE: B Natriuretic Peptide: 4449.1 pg/mL — ABNORMAL HIGH (ref 0.0–100.0)

## 2018-03-04 LAB — TROPONIN I: Troponin I: 0.06 ng/mL (ref <0.03)

## 2018-03-04 LAB — TSH: TSH: 2.436 u[IU]/mL (ref 0.350–4.500)

## 2018-03-04 LAB — I-STAT TROPONIN, ED: Troponin i, poc: 0.04 ng/mL (ref 0.00–0.08)

## 2018-03-04 MED ORDER — SODIUM CHLORIDE 0.9% FLUSH: 3.0000 mL | INTRAVENOUS | Status: DC | PRN

## 2018-03-04 MED ORDER — ACETAMINOPHEN 325 MG PO TABS
650.0000 mg | ORAL_TABLET | ORAL | Status: DC | PRN
Start: 2018-03-04 — End: 2018-03-07

## 2018-03-04 MED ORDER — VENLAFAXINE HCL ER 225 MG PO TB24: 150.0000 mg | ORAL_TABLET | Freq: Every day | ORAL | Status: DC

## 2018-03-04 MED ORDER — ONDANSETRON HCL 4 MG/2ML IJ SOLN
4.0000 mg | Freq: Once | INTRAMUSCULAR | Status: AC
  Administered 2018-03-04: 4 mg via INTRAVENOUS
  Filled 2018-03-04: qty 2

## 2018-03-04 MED ORDER — ADULT MULTIVITAMIN W/MINERALS CH
1.0000 | ORAL_TABLET | Freq: Every day | ORAL | Status: DC
  Administered 2018-03-05 – 2018-03-07 (×3): 1 via ORAL
  Filled 2018-03-04 (×3): qty 1

## 2018-03-04 MED ORDER — BUDESONIDE 0.25 MG/2ML IN SUSP
0.2500 mg | Freq: Two times a day (BID) | RESPIRATORY_TRACT | Status: DC
  Administered 2018-03-05 – 2018-03-07 (×5): 0.25 mg via RESPIRATORY_TRACT
  Filled 2018-03-04 (×6): qty 2

## 2018-03-04 MED ORDER — VENLAFAXINE HCL ER 75 MG PO CP24
225.0000 mg | ORAL_CAPSULE | Freq: Every day | ORAL | Status: DC
  Administered 2018-03-05 – 2018-03-07 (×2): 225 mg via ORAL
  Filled 2018-03-04: qty 1
  Filled 2018-03-04 (×3): qty 3

## 2018-03-04 MED ORDER — HEPARIN SODIUM (PORCINE) 5000 UNIT/ML IJ SOLN
5000.0000 [IU] | Freq: Three times a day (TID) | INTRAMUSCULAR | Status: DC
  Administered 2018-03-04 – 2018-03-07 (×7): 5000 [IU] via SUBCUTANEOUS
  Filled 2018-03-04 (×7): qty 1

## 2018-03-04 MED ORDER — PRAVASTATIN SODIUM 40 MG PO TABS
40.0000 mg | ORAL_TABLET | Freq: Every day | ORAL | Status: DC
  Administered 2018-03-04 – 2018-03-07 (×4): 40 mg via ORAL
  Filled 2018-03-04 (×4): qty 1

## 2018-03-04 MED ORDER — SODIUM CHLORIDE 0.9% FLUSH
3.0000 mL | Freq: Two times a day (BID) | INTRAVENOUS | Status: DC
  Administered 2018-03-05 – 2018-03-07 (×5): 3 mL via INTRAVENOUS

## 2018-03-04 MED ORDER — FUROSEMIDE 10 MG/ML IJ SOLN
40.0000 mg | Freq: Two times a day (BID) | INTRAMUSCULAR | Status: DC
  Administered 2018-03-04 – 2018-03-05 (×2): 40 mg via INTRAVENOUS
  Filled 2018-03-04 (×2): qty 4

## 2018-03-04 MED ORDER — ALBUTEROL SULFATE HFA 108 (90 BASE) MCG/ACT IN AERS: 2.0000 | INHALATION_SPRAY | Freq: Four times a day (QID) | RESPIRATORY_TRACT | Status: DC | PRN

## 2018-03-04 MED ORDER — ASPIRIN EC 81 MG PO TBEC
81.0000 mg | DELAYED_RELEASE_TABLET | Freq: Every day | ORAL | Status: DC
  Administered 2018-03-04 – 2018-03-07 (×4): 81 mg via ORAL
  Filled 2018-03-04 (×4): qty 1

## 2018-03-04 MED ORDER — IPRATROPIUM BROMIDE 0.06 % NA SOLN
2.0000 | Freq: Two times a day (BID) | NASAL | Status: DC
  Administered 2018-03-05 (×2): 2 via NASAL
  Filled 2018-03-04: qty 15

## 2018-03-04 MED ORDER — SODIUM CHLORIDE 0.9 % IV SOLN: 250.0000 mL | INTRAVENOUS | Status: DC | PRN

## 2018-03-04 MED ORDER — ONDANSETRON HCL 4 MG/2ML IJ SOLN: 4.0000 mg | Freq: Four times a day (QID) | INTRAMUSCULAR | Status: DC | PRN

## 2018-03-04 MED ORDER — FUROSEMIDE 10 MG/ML IJ SOLN
20.0000 mg | Freq: Once | INTRAMUSCULAR | Status: AC
  Administered 2018-03-04: 20 mg via INTRAVENOUS
  Filled 2018-03-04: qty 2

## 2018-03-04 NOTE — H&P (Addendum)
The patient has been seen in conjunction with Melina Copa, PA-C. All aspects of care have been considered and discussed. The patient has been personally interviewed, examined, and all clinical data has been reviewed.   New onset presumed acute systolic heart failure based upon exam and radiographic findings.  Associated intermittent episodes of chest pressure preceding worsened dyspnea raises the question of ischemia/coronary disease as the etiology of left ventricular dysfunction.  Cycle cardiac markers, IV diuresis with furosemide 40 mg every 12 hours, electrolyte replacement, renin angiotensin aldosterone system blockade, low-dose beta-blocker therapy once reasonable diuresis established.  Once clinically stable, will need ischemic evaluation.  2D Doppler echocardiogram within the next 24 hours.  Cardiology History & Physical    Patient ID: Jordan [name] MRN: [id number], DOB: Jun 14, 1961 Date of Encounter: 03/04/2018, 3:30 PM Primary Physician: Gavin Pound, MD Primary Cardiologist: New to Dr. Tamala Julian  Chief Complaint: shortness of breath Reason for Admission: new onset CHF Requesting MD: Donnelly Angelica PA-C  HPI: Jordan [name] is a 57 y.o. female with history of remote mitral valve prolapse, HTN, pre-diabetes, hyponatremia 2014, arthritis, ongoing tobacco abuse, and family history of CAD who presented to [facility] with SOB.  She reports a very remote hx of MVP diagnosed decades ago, without need for follow-up until recently. In November she developed shortness of breath and cough so was treated for a URI on multiple occasions. Finally about 3 weeks ago after being treated with an antibiotic and nasal spray, she saw near-complete relief - she had been told they saw "2 spots of pneumonia". She did not have any fevers or chills at that time but had general chest congestion and dyspnea with those illnesses. She recently saw her primary care at [facility] who noted enlargement of her heart on  imaging and ordered an echo which was actually pending for tomorrow. About 8 days ago she developed recurrent worsening of shortness of breath, this time associated with orthopnea, PND, and thick white sputum cough. She saw primary care again and got an RX for albuterol inhaler and nasal fluticasone again, but it hasn't helped. She denies any LEE or abdominal distention. She denies any alcohol or illicit drug use. Her father had 5v CABG in his 42s but he was also diabetic. Due to persistent dyspnea she presented to the ER today. On the way here had 45 seconds of sharp atypical CP but otherwise has not had chest pain recently. She reports recent 7lb weight loss but states she's been trying to eat healthier. She also recently got an rx for Chantix as she was ready to quit smoking but has not started this yet. Workup in the ED reveals mild HTN to 153/92 (pt states usually controlled), afebrile, HR 90s, CXR suggestive of cardiomegaly with interstitial pulmonary edema and small bilateral pleural effusions as well as basilar airspace disease felt likely due to atelectasis. Labs showed K 5.2 (hemolyzed), glucose 128, BUN/Cr 18/0.99, tropnin negative, lactic acid slightly elevated at 2.18, CBC wnl, BNP 4449.  ED has administered 20mg  IV Lasix.  Past Medical History:  Diagnosis Date  . Arthritis   . Arthritis of knee, left 08/25/2013  . Cardiomegaly 03/02/2018  . Hypertension   . Hyponatremia 08/26/2013  . MVP (mitral valve prolapse)   . Pre-diabetes   . Tobacco abuse      Surgical History:  Past Surgical History:  Procedure Laterality Date  . ABDOMINAL HYSTERECTOMY  94  . APPENDECTOMY    . CERVICAL DISCECTOMY  08  .  KNEE ARTHROSCOPY Right 09,and ?  . SHOULDER ARTHROSCOPY Left    bone spur  . TOTAL KNEE ARTHROPLASTY Right 08/25/2013   Procedure: TOTAL KNEE ARTHROPLASTY;  Surgeon: Kerin Salen, MD;  Location: [facility];  Service: Orthopedics;  Laterality: Right;     Home Meds: Prior to Admission  medications   Medication Sig Start Date End Date Taking? Authorizing Provider  albuterol (PROVENTIL HFA;VENTOLIN HFA) 108 (90 Base) MCG/ACT inhaler Inhale 2 puffs into the lungs every 6 (six) hours as needed for wheezing or shortness of breath. 01/16/18  Yes [provider]  aspirin EC 81 MG tablet Take 81 mg by mouth daily.   Yes [provider]  CALCIUM PO Take 1 tablet by mouth daily.   Yes [provider]  FLOVENT HFA 110 MCG/ACT inhaler Inhale 1 puff into the lungs 2 (two) times daily. 02/27/18  Yes [provider]  ipratropium (ATROVENT) 0.06 % nasal spray Place 2 sprays into both nostrils 2 (two) times daily. 02/24/18  Yes [provider]  lactobacillus acidophilus (BACID) TABS tablet Take 1 tablet by mouth as needed (digestion aid).   Yes [provider]  Multiple Vitamins-Minerals (MULTIVITAMIN WITH MINERALS) tablet Take 1 tablet by mouth daily.   Yes [provider]  pravastatin (PRAVACHOL) 40 MG tablet Take 40 mg by mouth daily.   Yes [provider]  promethazine-dextromethorphan (PROMETHAZINE-DM) 6.25-15 MG/5ML syrup Take 5 mLs by mouth 4 (four) times daily as needed for cough.   Yes [provider]  Venlafaxine HCl 225 MG TB24 Take 150 mg by mouth daily.   Yes [provider]  aspirin EC 325 MG tablet Take 1 tablet (325 mg total) by mouth 2 (two) times daily. Patient not taking: Reported on 03/04/2018 08/25/13   Leighton Parody, PA-C  methocarbamol (ROBAXIN) 500 MG tablet Take 1 tablet (500 mg total) by mouth 2 (two) times daily with a meal. Patient not taking: Reported on 03/04/2018 08/25/13   Leighton Parody, PA-C  varenicline (CHANTIX) 0.5 MG tablet Take 0.5 mg by mouth daily.    [provider]    Allergies:  Allergies  Allergen Reactions  . Guaifenesin & Derivatives Hives  . Naprosyn [Naproxen] Hives    Social History   Socioeconomic History  . Marital status: Married     Spouse name: Not on file  . Number of children: Not on file  . Years of education: Not on file  . Highest education level: Not on file  Occupational History  . Not on file  Social Needs  . Financial resource strain: Not on file  . Food insecurity:    Worry: Not on file    Inability: Not on file  . Transportation needs:    Medical: Not on file    Non-medical: Not on file  Tobacco Use  . Smoking status: Current Every Day Smoker    Packs/day: 1.00    Years: 20.00    Pack years: 20.00    Types: Cigarettes  . Smokeless tobacco: Never Used  . Tobacco comment: Has smoked for over 20 years  Substance and Sexual Activity  . Alcohol use: Yes    Alcohol/week: 0.5 oz    Types: 1 Standard drinks or equivalent per week    Comment: Rare - 1 drink every 2-3 months  . Drug use: No  . Sexual activity: Not on file  Lifestyle  . Physical activity:    Days per week: Not on file  Minutes per session: Not on file  . Stress: Not on file  Relationships  . Social connections:    Talks on phone: Not on file    Gets together: Not on file    Attends religious service: Not on file    Active member of club or organization: Not on file    Attends meetings of clubs or organizations: Not on file    Relationship status: Not on file  . Intimate partner violence:    Fear of current or ex partner: Not on file    Emotionally abused: Not on file    Physically abused: Not on file    Forced sexual activity: Not on file  Other Topics Concern  . Not on file  Social History Narrative  . Not on file     Family History  Problem Relation Age of Onset  . CAD Father        5v CABG in his 66s  . Diabetes Father   . Hypertension Father   . Liver disease Sister   . Diabetes Brother     Review of Systems: All other systems reviewed and are otherwise negative except as noted above.  Labs:   Lab Results  Component Value Date   WBC 6.9 03/04/2018   HGB 13.9 03/04/2018   HCT 42.5 03/04/2018   MCV  95.5 03/04/2018   PLT 246 03/04/2018    Recent Labs  Lab 03/04/18 1158  NA 136  K 5.2*  CL 106  CO2 16*  BUN 18  CREATININE 0.99  CALCIUM 9.6  GLUCOSE 128*   No results for input(s): CKTOTAL, CKMB, TROPONINI in the last 72 hours. No results found for: CHOL, HDL, LDLCALC, TRIG No results found for: DDIMER  Radiology/Studies:  Dg Chest 2 View  Result Date: 03/04/2018 CLINICAL DATA:  Shortness of breath and productive cough for 3 days. EXAM: CHEST - 2 VIEW COMPARISON:  PA and lateral chest 01/16/2018 and 08/20/2013. FINDINGS: There is cardiomegaly and interstitial pulmonary edema. Small bilateral pleural effusions are seen. Basilar airspace disease is likely atelectasis. No pneumothorax. IMPRESSION: Cardiomegaly and pulmonary edema. Small bilateral pleural effusions and basilar airspace disease which is likely due to atelectasis. Electronically Signed   By: Inge Rise M.D.   On: 03/04/2018 11:41   Wt Readings from Last 3 Encounters:  08/28/13 167 lb 8.8 oz (76 kg)  08/20/13 161 lb 2 oz (73.1 kg)    EKG: sinus tach 102bpm with possible LAE, LVH with repolarization abnormalities and diffuse ST-T changes which are quite different from 2014.  Physical Exam: Blood pressure (!) 139/97, pulse 94, temperature 97.7 F (36.5 C), temperature source Oral, resp. rate (!) 22, SpO2 95 %. There is no height or weight on file to calculate BMI. General: Well developed, well nourished WF, in no acute distress. Head: Normocephalic, atraumatic, sclera non-icteric, no xanthomas, nares are without discharge.  Neck: Negative for carotid bruits. JVD not elevated. Lungs: Slightly diminished at bases with bibasilar crackles. Breathing is unlabored. Heart: RRR (borderline elevated rate) with S1 S2. No murmurs, rubs, or gallops appreciated. Abdomen: Soft, non-tender, non-distended with normoactive bowel sounds. No hepatomegaly. No rebound/guarding. No obvious abdominal masses. Msk:  Strength and tone  appear normal for age. Extremities: No clubbing or cyanosis. No edema.  Distal pedal pulses are 2+ and equal bilaterally. Neuro: Alert and oriented X 3. No focal deficit. No facial asymmetry. Moves all extremities spontaneously. Psych:  Responds to questions appropriately with a normal affect.  Assessment and Plan   1. New onset acute heart failure - etiology unknown. Reports recurrent "upper respiratory tract infections" beginning in November and was treated for pneumonia approximately 3 weeks ago with clinical improvement. It will remain unclear if she truly had URI or was beginning to manifest CHF symptoms at that time. If she did have true URI, then it's very possible she has developed an interim viral myocarditis. Will need to check echo to determine if this is systolic vs diastolic dysfunction. Given tobacco history would need to consider ischemia as a contributor. Will admit, cycle troponins, follow I/O's. I have written order for ER to measure a baseline weight so we have one to trend during this admission. Tentatively I have already reviewed 2g sodium restriction, 2L fluid restriction, daily weights with patient. Will discuss IV diuretic dosing with MD. Hold off beta blocker in acute setting. Need to follow up repeat potassium before adding ACEI/ARB/spironolactone. Check TSH.  2. Essential HTN - she reports a history of this but states it's typically controlled. Diastolic remains quite high. Follow with diuresis.  3. ? Hyperlipidemia - patient is on pravastatin. Continue for now but check lipids and titrate if necessary.  4. Pre-diabetes - check A1C given hyperglycemia.  5. Hyperkalemia - specimen hemolyzed. Recheck with next labs.  6. Tobacco abuse - counseled on importance of cessation. She did not yet start Chantix; will hold as inpatient but consider upon dc.  I anticipate inpatient status for ongoing workup and diuresis, but if she markedly improves tomorrow and does not require  further workup, will need to switch to Obs with Code 44 completed.   Severity of Illness: The appropriate patient status for this patient is INPATIENT. Inpatient status is judged to be reasonable and necessary in order to provide the required intensity of service to ensure the patient's safety. The patient's presenting symptoms, physical exam findings, and initial radiographic and laboratory data in the context of their chronic comorbidities is felt to place them at high risk for further clinical deterioration. Furthermore, it is not anticipated that the patient will be medically stable for discharge from the hospital within 2 midnights of admission. The following factors support the patient status of inpatient.   " The patient's presenting symptoms include SOB, orthopnea, PND. " The worrisome physical exam findings include crackles on lungs. " The initial radiographic and laboratory data are worrisome because of indicating CHF - by CXR and elevated BNP, lactic acidosis, hyperkalemia " The chronic co-morbidities include mentioned above - pre diabetes, hyperkalemia, tobacco abuse.   * I certify that at the point of admission it is my clinical judgment that the patient will require inpatient hospital care spanning beyond 2 midnights from the point of admission due to high intensity of service, high risk for further deterioration and high frequency of surveillance required.*    For questions or updates, please contact [facility] Please consult [email or web address] for contact info under Cardiology/STEMI.  Signed, Charlie Pitter, PA-C 03/04/2018, 3:30 PM

## 2018-03-04 NOTE — ED Notes (Signed)
Patient transported to X-ray 

## 2018-03-04 NOTE — ED Provider Notes (Signed)
[place] EMERGENCY DEPARTMENT Provider Note   CSN: [id number] Arrival date & time: 03/04/18  1055     History   Chief Complaint Chief Complaint  Patient presents with  . Shortness of Breath    HPI Jordan [name] is a 57 y.o. female w PMHx mitral valve prolapse, cardiomegaly, HTN, presenting to ED with 1 week of worsening SOB.  Patient states shortness of breath is worse with exertion and laying flat, however is present at rest.  She reports associated productive cough of white thick sputum.  She has had intermittent sharp mid to left-sided chest pains for the past 3 days, last episode in route via EMS.  Chest pain was treated with aspirin, which patient states provided relief.  This pain does not radiate, however is associated with nausea.  She states she has been having problems with shortness of breath since November, with multiple different diagnoses and treatments.  She states 3 weeks ago she was treated for pneumonia and completed the antibiotic 2 weeks ago.  She is currently using inhalers, including albuterol, Flovent, and an antitussive.  She states the albuterol provides some relief, however is not lasting.  Denies fever, chills, sore throat, lower extremity edema.  Patient has echocardiogram scheduled, however has not had one in the past.  No history of stress test.  Reports past history of hypertension, however has not needed medication for control past 2 years.  The history is provided by the patient.    Past Medical History:  Diagnosis Date  . Arthritis   . Arthritis of knee, left 08/25/2013  . Cardiomegaly 03/02/2018  . Hypertension   . Hyponatremia 08/26/2013  . MVP (mitral valve prolapse)   . Pre-diabetes   . Tobacco abuse     Patient Active Problem List   Diagnosis Date Noted  . MVP (mitral valve prolapse) 03/02/2018  . Hypertension 03/02/2018  . Shortness of breath 03/02/2018  . Pleural effusion 03/02/2018  . Cardiomegaly 03/02/2018  .  S/P total knee arthroplasty 08/29/2013  . Hyponatremia 08/26/2013  . Arthritis of knee, left 08/25/2013    Past Surgical History:  Procedure Laterality Date  . ABDOMINAL HYSTERECTOMY  94  . APPENDECTOMY    . CERVICAL DISCECTOMY  08  . KNEE ARTHROSCOPY Right 09,and ?  . SHOULDER ARTHROSCOPY Left    bone spur  . TOTAL KNEE ARTHROPLASTY Right 08/25/2013   Procedure: TOTAL KNEE ARTHROPLASTY;  Surgeon: Kerin Salen, MD;  Location: [facility];  Service: Orthopedics;  Laterality: Right;     OB History   None      Home Medications    Prior to Admission medications   Medication Sig Start Date End Date Taking? Authorizing Provider  albuterol (PROVENTIL HFA;VENTOLIN HFA) 108 (90 Base) MCG/ACT inhaler Inhale 2 puffs into the lungs every 6 (six) hours as needed for wheezing or shortness of breath. 01/16/18  Yes [provider]  aspirin EC 81 MG tablet Take 81 mg by mouth daily.   Yes [provider]  CALCIUM PO Take 1 tablet by mouth daily.   Yes [provider]  FLOVENT HFA 110 MCG/ACT inhaler Inhale 1 puff into the lungs 2 (two) times daily. 02/27/18  Yes [provider]  ipratropium (ATROVENT) 0.06 % nasal spray Place 2 sprays into both nostrils 2 (two) times daily. 02/24/18  Yes [provider]  lactobacillus acidophilus (BACID) TABS tablet Take 1 tablet by mouth as needed (digestion aid).   Yes [provider]  Multiple Vitamins-Minerals (MULTIVITAMIN WITH MINERALS) tablet Take 1 tablet by mouth daily.   Yes [provider]  pravastatin (PRAVACHOL) 40 MG tablet Take 40 mg by mouth daily.   Yes [provider]  promethazine-dextromethorphan (PROMETHAZINE-DM) 6.25-15 MG/5ML syrup Take 5 mLs by mouth 4 (four) times daily as needed for cough.   Yes [provider]  Venlafaxine HCl 225 MG TB24 Take 150 mg by mouth daily.   Yes [provider]  aspirin EC 325 MG tablet Take 1 tablet (325 mg total) by mouth 2 (two)  times daily. Patient not taking: Reported on 03/04/2018 08/25/13   Leighton Parody, PA-C  methocarbamol (ROBAXIN) 500 MG tablet Take 1 tablet (500 mg total) by mouth 2 (two) times daily with a meal. Patient not taking: Reported on 03/04/2018 08/25/13   Leighton Parody, PA-C  varenicline (CHANTIX) 0.5 MG tablet Take 0.5 mg by mouth daily.    [provider]    Family History Family History  Problem Relation Age of Onset  . CAD Father        5v CABG in his 18s  . Diabetes Father   . Hypertension Father   . Liver disease Sister   . Diabetes Brother     Social History Social History   Tobacco Use  . Smoking status: Current Every Day Smoker    Packs/day: 1.00    Years: 20.00    Pack years: 20.00    Types: Cigarettes  . Smokeless tobacco: Never Used  . Tobacco comment: Has smoked for over 20 years  Substance Use Topics  . Alcohol use: Yes    Alcohol/week: 0.5 oz    Types: 1 Standard drinks or equivalent per week    Comment: Rare - 1 drink every 2-3 months  . Drug use: No     Allergies   Guaifenesin & derivatives and Naprosyn [naproxen]   Review of Systems Review of Systems  Constitutional: Negative for chills, diaphoresis and fever.  HENT: Positive for congestion. Negative for sore throat.   Respiratory: Positive for cough and shortness of breath.   Cardiovascular: Positive for chest pain. Negative for leg swelling.  Gastrointestinal: Positive for nausea. Negative for vomiting.  All other systems reviewed and are negative.    Physical Exam Updated Vital Signs BP (!) 139/97 (BP Location: Right Arm)   Pulse 94   Temp 97.7 F (36.5 C) (Oral)   Resp (!) 22   SpO2 95%   Physical Exam  Constitutional: She is oriented to person, place, and time. She appears well-developed and well-nourished.  Pale-appearing.  HENT:  Head: Normocephalic and atraumatic.  Mouth/Throat: Oropharynx is clear and moist.  Eyes: Conjunctivae are normal.  Cardiovascular: Normal  rate, regular rhythm, normal heart sounds and intact distal pulses.  Pulmonary/Chest: No stridor. No respiratory distress. She has no wheezes. She has rales (fine rales b/l bases).  Mildly inc effort, no retractions. Not tachypneic, O2 sat 97% on RA  Abdominal: Soft. Bowel sounds are normal. She exhibits no distension. There is no tenderness. There is no guarding.  Musculoskeletal: She exhibits no edema.  Lymphadenopathy:    She has no cervical adenopathy.  Neurological: She is alert and oriented to person, place, and time.  Skin: Skin is warm.  Psychiatric: She has a normal mood and affect. Her behavior is normal.  Nursing note and vitals reviewed.    ED Treatments / Results  Labs (all labs ordered are listed, but only abnormal results are displayed) Labs Reviewed  BASIC METABOLIC PANEL - Abnormal; Notable for the following components:      Result Value   Potassium 5.2 (*)    CO2 16 (*)    Glucose, Bld 128 (*)    All other components within normal limits  BRAIN NATRIURETIC PEPTIDE - Abnormal; Notable for the following components:   B Natriuretic Peptide 4,449.1 (*)    All other components within normal limits  URINALYSIS, ROUTINE W REFLEX MICROSCOPIC - Abnormal; Notable for the following components:   Color, Urine AMBER (*)    APPearance HAZY (*)    Hgb urine dipstick MODERATE (*)    Bilirubin Urine SMALL (*)    Protein, ur 100 (*)    Leukocytes, UA TRACE (*)    Bacteria, UA FEW (*)    Squamous Epithelial / LPF 6-30 (*)    All other components within normal limits  I-STAT CG4 LACTIC ACID, ED - Abnormal; Notable for the following components:   Lactic Acid, Venous 2.18 (*)    All other components within normal limits  I-STAT VENOUS BLOOD GAS, ED - Abnormal; Notable for the following components:   pO2, Ven 20.0 (*)    All other components within normal limits  CBC WITH DIFFERENTIAL/PLATELET  BLOOD GAS, VENOUS  I-STAT TROPONIN, ED  I-STAT CG4 LACTIC ACID, ED    EKG EKG  Interpretation  Date/Time:  Wednesday March 04 2018 10:59:58 EDT Ventricular Rate:  102 PR Interval:  154 QRS Duration: 86 QT Interval:  366 QTC Calculation: 477 R Axis:   38 Text Interpretation:  Sinus tachycardia Possible Left atrial enlargement Left ventricular hypertrophy with repolarization abnormality Abnormal ECG new st segment changes in inferior and lateral leads, likely LVH Otherwise no significant change Confirmed by Deno Etienne [phone]) on 03/04/2018 1:08:14 PM   Radiology Dg Chest 2 View  Result Date: 03/04/2018 CLINICAL DATA:  Shortness of breath and productive cough for 3 days. EXAM: CHEST - 2 VIEW COMPARISON:  PA and lateral chest 01/16/2018 and 08/20/2013. FINDINGS: There is cardiomegaly and interstitial pulmonary edema. Small bilateral pleural effusions are seen. Basilar airspace disease is likely atelectasis. No pneumothorax. IMPRESSION: Cardiomegaly and pulmonary edema. Small bilateral pleural effusions and basilar airspace disease which is likely due to atelectasis. Electronically Signed   By: Inge Rise M.D.   On: 03/04/2018 11:41    Procedures Procedures (including critical care time)  Medications Ordered in ED Medications  ondansetron (ZOFRAN) injection 4 mg (4 mg Intravenous Given 03/04/18 1250)  furosemide (LASIX) injection 20 mg (20 mg Intravenous Given 03/04/18 1457)     Initial Impression / Assessment and Plan / ED Course  I have reviewed the triage vital signs and the nursing notes.  Pertinent labs & imaging results that were available during my care of the patient were reviewed by me and considered in my medical decision making (see chart for details).  Clinical Course as of Mar 05 1535  Wed Mar 04, 2018  1311 Results discussed with Dr. Tyrone Nine. Will add VBG, lactic, urine to look for cause of acidosis. Pt will require admission for workup of new onset heart failure.   [JR]  1425 Spoke with Trish with cardiology, cardiology to see patient.   [JR]     Clinical Course User Index [JR] Mekiyah Gladwell, [place] N, PA-C    Patient presenting to the ED with 1 week of worsening shortness of breath, worse on exertion and laying flat.  Patient also with some episodes of chest pain this morning, resolved on arrival to  ED.  Patient with history of mitral valve prolapse and remote history of hypertension, however no known history of CAD or heart failure.  Patient was scheduled for future outpatient echo.  Does not currently have a cardiologist.  On exam, patient with slightly increased work of breathing, and rales bilaterally.  No appreciable lower extremity edema.  Concern for CHF given history and exam.  Cardiac labs ordered, including troponin and BNP, and basic labs, chest x-ray.  Chest x-ray showing cardiomegaly, pulmonary edema, and small bilateral pleural effusions. Initial trop neg. BNP markedly elevated at 4,450. Pt also with low CO2. Lactic and VBG ordered for cause of acidosis. Discussed plan with patient for admission for new onset CHF, agreeable to plan. 20mg  IV lasix ordered.  Lactic elevated 2.18, VBG w PO2 20,  Bicarb wnl, pH wnl. Cardiology consulted and to evaluate patient.  Pending cardiology recommendation for admission, handoff given at shift change to [facility], PA-C.   Final Clinical Impressions(s) / ED Diagnoses   Final diagnoses:  New onset of congestive heart failure [facility])    ED Discharge Orders    None       Vonna Brabson, [place] N, PA-C 03/04/18 [place], DO 03/04/18 1621

## 2018-03-04 NOTE — ED Notes (Signed)
[place] paged to relay potassium/troponin labs

## 2018-03-04 NOTE — ED Provider Notes (Signed)
Care assumed from previous provider PA Quentin Cornwall. Please see note for further details. Case discussed, plan agreed upon. Will follow up on cardiology recommendations, likely admission.   Patient evaluated by cardiology who will admit for new-onset CHF. Patient aware of plan of care.    Maisee Vollman, Ozella Almond, PA-C 03/04/18 1714    Daleen Bo, MD 03/07/18 [phone]

## 2018-03-04 NOTE — Progress Notes (Addendum)
Repeat potassium ordered, wrote care order for nurse to call cardiology call APP with result - if this was in fact hemolysis and level is normal, would recommend to start losartan 25mg  daily. Will also need attention to potassium level since additional diuretic is ordered (40mg  IV BID). I signed this out to [facility] PA-C who is on call. Adlee Paar PA-C

## 2018-03-04 NOTE — ED Notes (Signed)
Called to check on diet tray for pt

## 2018-03-04 NOTE — ED Notes (Signed)
Placed 2L on pt

## 2018-03-04 NOTE — ED Triage Notes (Signed)
[facility]- pt coming from home with complaint of shortness of breath. Pt also had an episode of chest pain this morning around 0300 and also in route to the hospital. Described as a left side dull pain 3/10, 324mg  of aspirin and pain subsided. SOB worse with exertion.

## 2018-03-04 NOTE — ED Notes (Signed)
Cardiology at bedside for evaluation.

## 2018-03-04 NOTE — Progress Notes (Deleted)
Cardiology Office Note:    Date:  03/04/2018   ID:  [name], DOB [birth year], MRN [id number]  PCP:  Gavin Pound, MD  Cardiologist:  Shirlee More, MD   Referring MD: Gavin Pound, MD  ASSESSMENT:    No diagnosis found. PLAN:    In order of problems listed above:  1. ***  Next appointment   Medication Adjustments/Labs and Tests Ordered: Current medicines are reviewed at length with the patient today.  Concerns regarding medicines are outlined above.  No orders of the defined types were placed in this encounter.  No orders of the defined types were placed in this encounter.    No chief complaint on file. ***  History of Present Illness:    Jordan [name] is a 57 y.o. female who is being seen today for the evaluation of cardiomegaly at the request of Nnodi, Adaku, MD.   Past Medical History:  Diagnosis Date  . Arthritis   . Arthritis of knee, left 08/25/2013  . Cardiomegaly 03/02/2018  . Hypertension   . Hyponatremia 08/26/2013  . Mitral valve prolapse 03/02/2018  . MVP (mitral valve prolapse)   . Pleural effusion 03/02/2018  . S/P total knee arthroplasty 08/29/2013  . Shortness of breath 03/02/2018    Past Surgical History:  Procedure Laterality Date  . ABDOMINAL HYSTERECTOMY  94  . APPENDECTOMY    . CERVICAL DISCECTOMY  08  . KNEE ARTHROSCOPY Right 09,and ?  . SHOULDER ARTHROSCOPY Left    bone spur  . TOTAL KNEE ARTHROPLASTY Right 08/25/2013   Procedure: TOTAL KNEE ARTHROPLASTY;  Surgeon: Kerin Salen, MD;  Location: [facility];  Service: Orthopedics;  Laterality: Right;    Current Medications: No outpatient medications have been marked as taking for the 03/05/18 encounter (Appointment) with Richardo Priest, MD.     Allergies:   Guaifenesin & derivatives and Naprosyn [naproxen]   Social History   Socioeconomic History  . Marital status: Married    Spouse name: Not on file  . Number of children: Not on file  . Years of education: Not on file  .  Highest education level: Not on file  Occupational History  . Not on file  Social Needs  . Financial resource strain: Not on file  . Food insecurity:    Worry: Not on file    Inability: Not on file  . Transportation needs:    Medical: Not on file    Non-medical: Not on file  Tobacco Use  . Smoking status: Current Every Day Smoker    Packs/day: 1.00    Types: Cigarettes  . Smokeless tobacco: Never Used  Substance and Sexual Activity  . Alcohol use: Yes    Alcohol/week: 0.5 oz    Types: 1 Standard drinks or equivalent per week  . Drug use: No  . Sexual activity: Not on file  Lifestyle  . Physical activity:    Days per week: Not on file    Minutes per session: Not on file  . Stress: Not on file  Relationships  . Social connections:    Talks on phone: Not on file    Gets together: Not on file    Attends religious service: Not on file    Active member of club or organization: Not on file    Attends meetings of clubs or organizations: Not on file    Relationship status: Not on file  Other Topics Concern  . Not on file  Social History Narrative  .  Not on file     Family History: The patient's ***family history includes CAD in her father; Diabetes in her brother and father; Hypertension in her father; Liver disease in her sister.  ROS:   ROS Please see the history of present illness.    *** All other systems reviewed and are negative.  EKGs/Labs/Other Studies Reviewed:    The following studies were reviewed today: ***  EKG:  EKG is *** ordered today.  The ekg ordered today demonstrates ***  Recent Labs: 03/04/2018: B Natriuretic Peptide 4,449.1; BUN 18; Creatinine, Ser 0.99; Hemoglobin 13.9; Platelets 246; Potassium 5.2; Sodium 136  Recent Lipid Panel No results found for: CHOL, TRIG, HDL, CHOLHDL, VLDL, LDLCALC, LDLDIRECT  Physical Exam:    VS:  There were no vitals taken for this visit.    Wt Readings from Last 3 Encounters:  08/28/13 167 lb 8.8 oz (76 kg)    08/20/13 161 lb 2 oz (73.1 kg)     GEN: *** Well nourished, well developed in no acute distress HEENT: Normal NECK: No JVD; No carotid bruits LYMPHATICS: No lymphadenopathy CARDIAC: ***RRR, no murmurs, rubs, gallops RESPIRATORY:  Clear to auscultation without rales, wheezing or rhonchi  ABDOMEN: Soft, non-tender, non-distended MUSCULOSKELETAL:  No edema; No deformity  SKIN: Warm and dry NEUROLOGIC:  Alert and oriented x 3 PSYCHIATRIC:  Normal affect     Signed, Shirlee More, MD  03/04/2018 1:32 PM     [facility]

## 2018-03-05 ENCOUNTER — Other Ambulatory Visit: Payer: Self-pay

## 2018-03-05 ENCOUNTER — Inpatient Hospital Stay (HOSPITAL_COMMUNITY): Payer: Federal, State, Local not specified - PPO

## 2018-03-05 ENCOUNTER — Ambulatory Visit: Payer: Federal, State, Local not specified - PPO | Admitting: Cardiology

## 2018-03-05 DIAGNOSIS — I361 Nonrheumatic tricuspid (valve) insufficiency: Secondary | ICD-10-CM

## 2018-03-05 DIAGNOSIS — I5021 Acute systolic (congestive) heart failure: Secondary | ICD-10-CM

## 2018-03-05 DIAGNOSIS — I10 Essential (primary) hypertension: Secondary | ICD-10-CM

## 2018-03-05 LAB — BASIC METABOLIC PANEL
Anion gap: 13 (ref 5–15)
BUN: 21 mg/dL — ABNORMAL HIGH (ref 6–20)
CO2: 24 mmol/L (ref 22–32)
Calcium: 9.4 mg/dL (ref 8.9–10.3)
Chloride: 101 mmol/L (ref 101–111)
Creatinine, Ser: 1.24 mg/dL — ABNORMAL HIGH (ref 0.44–1.00)
GFR calc Af Amer: 55 mL/min — ABNORMAL LOW (ref >60)
GFR calc non Af Amer: 48 mL/min — ABNORMAL LOW (ref >60)
Glucose, Bld: 101 mg/dL — ABNORMAL HIGH (ref 65–99)
Potassium: 4.1 mmol/L (ref 3.5–5.1)
Sodium: 138 mmol/L (ref 135–145)

## 2018-03-05 LAB — ECHOCARDIOGRAM COMPLETE
Height: 66 in
Weight: 2433.6 oz

## 2018-03-05 LAB — LIPID PANEL
Cholesterol: 130 mg/dL (ref 0–200)
HDL: 45 mg/dL
LDL Cholesterol: 66 mg/dL (ref 0–99)
Total CHOL/HDL Ratio: 2.9 ratio
Triglycerides: 96 mg/dL
VLDL: 19 mg/dL (ref 0–40)

## 2018-03-05 LAB — TROPONIN I
Troponin I: 0.05 ng/mL (ref <0.03)
Troponin I: 0.07 ng/mL (ref <0.03)

## 2018-03-05 LAB — HIV ANTIBODY (ROUTINE TESTING W REFLEX): HIV Screen 4th Generation wRfx: NONREACTIVE

## 2018-03-05 MED ORDER — ALBUTEROL SULFATE (2.5 MG/3ML) 0.083% IN NEBU: 3.0000 mL | INHALATION_SOLUTION | Freq: Four times a day (QID) | RESPIRATORY_TRACT | Status: DC | PRN

## 2018-03-05 MED ORDER — CARVEDILOL 3.125 MG PO TABS
3.1250 mg | ORAL_TABLET | Freq: Two times a day (BID) | ORAL | Status: DC
  Administered 2018-03-05: 3.125 mg via ORAL
  Filled 2018-03-05: qty 1

## 2018-03-05 MED ORDER — FUROSEMIDE 10 MG/ML IJ SOLN
80.0000 mg | Freq: Two times a day (BID) | INTRAMUSCULAR | Status: DC
  Administered 2018-03-05 – 2018-03-07 (×4): 80 mg via INTRAVENOUS
  Filled 2018-03-05 (×4): qty 8

## 2018-03-05 MED ORDER — PNEUMOCOCCAL VAC POLYVALENT 25 MCG/0.5ML IJ INJ
0.5000 mL | INJECTION | INTRAMUSCULAR | Status: AC
  Administered 2018-03-07: 0.5 mL via INTRAMUSCULAR
  Filled 2018-03-05: qty 0.5

## 2018-03-05 MED ORDER — LISINOPRIL 5 MG PO TABS
2.5000 mg | ORAL_TABLET | Freq: Every day | ORAL | Status: DC
  Administered 2018-03-05 – 2018-03-07 (×3): 2.5 mg via ORAL
  Filled 2018-03-05 (×3): qty 1

## 2018-03-05 NOTE — Progress Notes (Addendum)
The patient has been seen in conjunction with Kathyrn Drown, [facility]. All aspects of care have been considered and discussed. The patient has been personally interviewed, examined, and all clinical data has been reviewed.   Personally reviewed today's echo.  Dilated and severely hypocontractile LV with global wall motion abnormality, EF less than 25%.  Diuresis has been relatively small.  Will increase IV Lasix intensity to 80 mg every 12 hours.  Acute kidney injury with decrease in estimated GFR from 56-48.  This needs to be watched closely.  Have added very low-dose carvedilol and lisinopril.   Progress Note  Patient Name: Jordan [name] Date of Encounter: 03/05/2018  Primary Cardiologist: Dr. Suan Halter  Subjective   Pt reports breathing is improved overnight. Possible discrepancy with output given that she was transferred to floor from ED early this morning. Denies chest pain. Echocardiogram being completed this AM with pending results. Ischemic eval with stress versus cath based on echo results  Inpatient Medications    Scheduled Meds: . aspirin EC  81 mg Oral Daily  . budesonide  0.25 mg Inhalation BID  . furosemide  40 mg Intravenous BID  . heparin  5,000 Units Subcutaneous Q8H  . ipratropium  2 spray Each Nare BID  . multivitamin with minerals  1 tablet Oral Daily  . [START ON 03/06/2018] pneumococcal 23 valent vaccine  0.5 mL Intramuscular Tomorrow-1000  . pravastatin  40 mg Oral Daily  . sodium chloride flush  3 mL Intravenous Q12H  . venlafaxine XR  225 mg Oral Q breakfast   Continuous Infusions: . sodium chloride     PRN Meds: sodium chloride, acetaminophen, albuterol, ondansetron (ZOFRAN) IV, sodium chloride flush   Vital Signs    Vitals:   03/05/18 0230 03/05/18 0421 03/05/18 0720 03/05/18 0918  BP: (!) 145/71 (!) 158/103 (!) 150/72   Pulse: (!) 104 (!) 106 95   Resp: 18 18    Temp: (!) 97 F (36.1 C) 97.6 F (36.4 C) 98.6 F (37 C)   TempSrc: Oral  Oral Oral   SpO2: 96% 95% 95% 91%  Weight: 152 lb 1.6 oz (69 kg)     Height: 5\' 6"  (1.676 m)       Intake/Output Summary (Last 24 hours) at 03/05/2018 1018 Last data filed at 03/05/2018 1008 Gross per 24 hour  Intake 480 ml  Output 1030 ml  Net -550 ml   Filed Weights   03/04/18 1636 03/04/18 2000 03/05/18 0230  Weight: 155 lb 9.6 oz (70.6 kg) 155 lb (70.3 kg) 152 lb 1.6 oz (69 kg)    Physical Exam   General: Well developed, well nourished, NAD Skin: Warm, dry, intact  Head: Normocephalic, atraumatic, sclera non-icteric, no xanthomas, clear, moist mucus membranes. Neck: Negative for carotid bruits. No JVD Lungs:Clear to ausculation bilaterally. No wheezes, rales, or rhonchi. Breathing is unlabored. Cardiovascular: RRR with S1 S2. No murmurs, rubs, gallops, or LV heave appreciated. Abdomen: Soft, non-tender, non-distended with normoactive bowel sounds. No hepatomegaly, No rebound/guarding. No obvious abdominal masses. MSK: Strength and tone appear normal for age. 5/5 in all extremities Extremities: No edema. No clubbing or cyanosis. DP/PT pulses 2+ bilaterally Neuro: Alert and oriented. No focal deficits. No facial asymmetry. MAE spontaneously. Psych: Responds to questions appropriately with normal affect.    Labs    Chemistry Recent Labs  Lab 03/04/18 1158 03/04/18 1830 03/05/18 0503  NA 136  --  138  K 5.2* 4.2 4.1  CL 106  --  101  CO2 16*  --  24  GLUCOSE 128*  --  101*  BUN 18  --  21*  CREATININE 0.99 1.09* 1.24*  CALCIUM 9.6  --  9.4  PROT  --  7.6  --   ALBUMIN  --  4.3  --   AST  --  60*  --   ALT  --  64*  --   ALKPHOS  --  107  --   BILITOT  --  0.8  --   GFRNONAA >60 56* 48*  GFRAA >60 >60 55*  ANIONGAP 14  --  13     Hematology Recent Labs  Lab 03/04/18 1158  WBC 6.9  RBC 4.45  HGB 13.9  HCT 42.5  MCV 95.5  MCH 31.2  MCHC 32.7  RDW 13.5  PLT 246    Cardiac Enzymes Recent Labs  Lab 03/04/18 1830 03/04/18 2308 03/05/18 0503    TROPONINI 0.06* 0.05* 0.07*    Recent Labs  Lab 03/04/18 1205  TROPIPOC 0.04     BNP Recent Labs  Lab 03/04/18 1158  BNP 4,449.1*     DDimer No results for input(s): DDIMER in the last 168 hours.   Radiology    Dg Chest 2 View  Result Date: 03/04/2018 CLINICAL DATA:  Shortness of breath and productive cough for 3 days. EXAM: CHEST - 2 VIEW COMPARISON:  PA and lateral chest 01/16/2018 and 08/20/2013. FINDINGS: There is cardiomegaly and interstitial pulmonary edema. Small bilateral pleural effusions are seen. Basilar airspace disease is likely atelectasis. No pneumothorax. IMPRESSION: Cardiomegaly and pulmonary edema. Small bilateral pleural effusions and basilar airspace disease which is likely due to atelectasis. Electronically Signed   By: Inge Rise M.D.   On: 03/04/2018 11:41    Telemetry    03/05/18 NSR with PVCs - Personally Reviewed  ECG     03/04/18 NSR V5-V5, leads I, II with ST changes  Personally Reviewed  Cardiac Studies   Echocardiogram in process   Patient Profile     57 y.o. female with history of remote mitral valve prolapse, HTN, pre-diabetes, hyponatremia 2014, arthritis, ongoing tobacco abuse, and family history of CAD who presented to [facility] with SOB.  Assessment & Plan    1. New onset acute heart failure: -Echocardiogram with pending results this AM. Will need to determine diastolic versus systolic dysfunction  -Trop elevated despite absence of chest pain, 0.06, 0.05, 0.07 -Weight, 152lb today, 155lb on admission  -I&O, net negative 550 ml today, total output yesterday 965ml  -Cr, 1.24 today, up from 1.09 yesterday  -Sodium restricted diet and 2L fluid restrict intake -BB not given secondary to acute presentation. Will need to add hypertensive agent>>?BB versus ACE-I  -Creatinine is worsening>> no ACE-I for now. Will need to re-acess with diuresis and echo results -TSH, 2.439, WNL -Will need ischemic evaluation once stabilized   2.  Essential HTN: -Elevated, 150/72, 158/103, 145/71 -Lasix IV 40 mg BID for fluid volume overload (could be contributing factor) -Will need antihypertensive agent once   3. Hyperlipidemia: -Lipid panel from 03/05/18 -CHO-130, HDL-45. LDL-66, Trig-96 -Continue pravastatin  4. Pre-diabetes:  -Hb A1C m not resolved in Epic, will follow   5. Hyperkalemia:  -K+ today 4.1 -Previous specimen thought to be hemolyzed (5.2 on admission)  6. Tobacco abuse: -Encouraged smoking cessation  -Chantix at discharge?   7. Acute renal insufficiency: -Cr, 1.24 today>>>1.09 on admission  -Baseline appears to be 0.5-0.6 range -Will not add ACE-I secondary to worsening renal  function   Signed, Kathyrn Drown NP-C [facility] Pager: [phone] 03/05/2018, 10:18 AM     For questions or updates, please contact   Please consult [email or web address] for contact info under Cardiology/STEMI.

## 2018-03-05 NOTE — ED Notes (Signed)
Attempted to call report- nurse will call me back

## 2018-03-05 NOTE — Progress Notes (Signed)
Responded to [facility] to assist patient with AD.  Patient want copy of document to review.  Patient will have nurse page chaplain services when ready to complete.  Will follow as needed.  Jaclynn Major, [place], [facility], Pager [phone]

## 2018-03-05 NOTE — Plan of Care (Signed)
Nutrition Education Note  RD consulted for nutrition education regarding new onset CHF.  Intern discussed why it is important for patient to adhere to diet recommendations, and emphasized the role of fluids, foods to avoid, and importance of weighing self daily.   Intern provided "Heart Failure Nutrition Therapy" handout from the [employer].   Reviewed patient's dietary recall. Provided examples on ways to decrease sodium intake in diet. Encouraged the use of salt free seasonings and advised against the use of salt substitutes. Discouraged intake of processed foods and use of salt shaker. Encouraged fresh fruits and vegetables as well as whole grain sources of carbohydrates to maximize fiber intake.   Teach back method used.  Pt has already made dietary changes prior to this admission. She endorses eating more salads, cooking without salt, and choosing lean proteins. Expect excellent compliance.  Body mass index is 24.55 kg/m. Pt meets criteria for normal based on current BMI.  Current diet order is 2g sodium/2L fluid patient is consuming approximately 100% of meals at this time.   Labs and medications reviewed.   No further nutrition interventions warranted at this time. RD contact information provided. If additional nutrition issues arise, please re-consult RD.   Evalette Montrose, MS, Dietetic Intern Pager # [phone]

## 2018-03-05 NOTE — Progress Notes (Signed)
  Echocardiogram 2D Echocardiogram has been performed.  Blayne Garlick T Devean Skoczylas 03/05/2018, 11:00 AM

## 2018-03-06 DIAGNOSIS — N183 Chronic kidney disease, stage 3 (moderate): Secondary | ICD-10-CM

## 2018-03-06 LAB — BASIC METABOLIC PANEL
Anion gap: 13 (ref 5–15)
BUN: 20 mg/dL (ref 6–20)
CO2: 25 mmol/L (ref 22–32)
Calcium: 9.7 mg/dL (ref 8.9–10.3)
Chloride: 96 mmol/L — ABNORMAL LOW (ref 101–111)
Creatinine, Ser: 1.14 mg/dL — ABNORMAL HIGH (ref 0.44–1.00)
GFR calc Af Amer: >60 mL/min (ref >60)
GFR calc non Af Amer: 53 mL/min — ABNORMAL LOW (ref >60)
Glucose, Bld: 106 mg/dL — ABNORMAL HIGH (ref 65–99)
Potassium: 3.4 mmol/L — ABNORMAL LOW (ref 3.5–5.1)
Sodium: 134 mmol/L — ABNORMAL LOW (ref 135–145)

## 2018-03-06 MED ORDER — CARVEDILOL 3.125 MG PO TABS
3.1250 mg | ORAL_TABLET | Freq: Two times a day (BID) | ORAL | Status: DC
  Administered 2018-03-06 – 2018-03-07 (×4): 3.125 mg via ORAL
  Filled 2018-03-06 (×4): qty 1

## 2018-03-06 NOTE — Progress Notes (Signed)
Progress Note  Patient Name: Jordan [name] Date of Encounter: 03/06/2018  Primary Cardiologist: No primary care provider on file.   Subjective   She slept relatively flat last evening and had no difficulty breathing.  She denies chest discomfort.  She has been able to walk.  She had significant diuresis since admission, >5 L.  Inpatient Medications    Scheduled Meds: . aspirin EC  81 mg Oral Daily  . budesonide  0.25 mg Inhalation BID  . carvedilol  3.125 mg Oral BID WC  . furosemide  80 mg Intravenous BID  . heparin  5,000 Units Subcutaneous Q8H  . ipratropium  2 spray Each Nare BID  . lisinopril  2.5 mg Oral Daily  . multivitamin with minerals  1 tablet Oral Daily  . pneumococcal 23 valent vaccine  0.5 mL Intramuscular Tomorrow-1000  . pravastatin  40 mg Oral Daily  . sodium chloride flush  3 mL Intravenous Q12H  . venlafaxine XR  225 mg Oral Q breakfast   Continuous Infusions: . sodium chloride     PRN Meds: sodium chloride, acetaminophen, albuterol, ondansetron (ZOFRAN) IV, sodium chloride flush   Vital Signs    Vitals:   03/06/18 0424 03/06/18 0700 03/06/18 0818 03/06/18 1100  BP: 119/79 (!) 142/88  113/62  Pulse: 66 78  87  Resp: 18 14  14   Temp: (!) 97.5 F (36.4 C) (!) 97.5 F (36.4 C)  97.7 F (36.5 C)  TempSrc: Oral Oral  Oral  SpO2: 96% 94% 92% 95%  Weight: 148 lb 3.2 oz (67.2 kg)     Height:        Intake/Output Summary (Last 24 hours) at 03/06/2018 1155 Last data filed at 03/06/2018 1115 Gross per 24 hour  Intake 1438 ml  Output 6450 ml  Net -5012 ml   Filed Weights   03/04/18 2000 03/05/18 0230 03/06/18 0424  Weight: 155 lb (70.3 kg) 152 lb 1.6 oz (69 kg) 148 lb 3.2 oz (67.2 kg)    Telemetry    Sinus rhythm- Personally Reviewed  ECG    No new data- Personally Reviewed  Physical Exam  Well appearing GEN: No acute distress.   Neck:  Patient is lying at 30 degrees and neck veins are relatively flat. Cardiac: RRR, soft S4 gallop.   No murmur or rubs. Respiratory:  Decreased breath sounds at the left base. GI: Soft, nontender, non-distended  MS: No edema; No deformity. Neuro:  Nonfocal  Psych: Normal affect   Labs    Chemistry Recent Labs  Lab 03/04/18 1158 03/04/18 1830 03/05/18 0503 03/06/18 0649  NA 136  --  138 134*  K 5.2* 4.2 4.1 3.4*  CL 106  --  101 96*  CO2 16*  --  24 25  GLUCOSE 128*  --  101* 106*  BUN 18  --  21* 20  CREATININE 0.99 1.09* 1.24* 1.14*  CALCIUM 9.6  --  9.4 9.7  PROT  --  7.6  --   --   ALBUMIN  --  4.3  --   --   AST  --  60*  --   --   ALT  --  64*  --   --   ALKPHOS  --  107  --   --   BILITOT  --  0.8  --   --   GFRNONAA >60 56* 48* 53*  GFRAA >60 >60 55* >60  ANIONGAP 14  --  13 13  Hematology Recent Labs  Lab 03/04/18 1158  WBC 6.9  RBC 4.45  HGB 13.9  HCT 42.5  MCV 95.5  MCH 31.2  MCHC 32.7  RDW 13.5  PLT 246    Cardiac Enzymes Recent Labs  Lab 03/04/18 1830 03/04/18 2308 03/05/18 0503  TROPONINI 0.06* 0.05* 0.07*    Recent Labs  Lab 03/04/18 1205  TROPIPOC 0.04     BNP Recent Labs  Lab 03/04/18 1158  BNP 4,449.1*     DDimer No results for input(s): DDIMER in the last 168 hours.   Radiology    No results found.  Cardiac Studies  2D Doppler echocardiogram 03/05/2018: Study Conclusions   - Left ventricle: The cavity size was mildly dilated. Wall   thickness was increased in a pattern of mild LVH. LV mid   ventricle false tendon. The estimated ejection fraction was 15%.   Diffuse hypokinesis. The study is not technically sufficient to   allow evaluation of LV diastolic function. - Mitral valve: Mildly thickened leaflets . There was moderate to   severe, eccentric regurgitation. - Left atrium: Severely dilated. - Right ventricle: The cavity size was mildly dilated. Mild   systolic dysfunction. - Right atrium: The atrium was mildly dilated. - Tricuspid valve: There was mild regurgitation. - Pulmonary arteries: PA peak  pressure: 34 mm Hg (S). - Inferior vena cava: The vessel was normal in size. The   respirophasic diameter changes were in the normal range (>= 50%),   consistent with normal central venous pressure.   Impressions:   - LVEF 15%, mildly dilated LV with mild LVH, severe global   hypokinesis, dilated RV with mild hypokinesis, moderate to severe   eccentric MR, severe LAE, mild RAE, mild TR, RVSP 34 mmHg, normal   IVC.   Patient Profile     57 y.o. female with history of remote mitral valve prolapse, HTN, pre-diabetes, hyponatremia 2014, arthritis, ongoing tobacco abuse, and family history of CAD who presented to [facility] with SOB.  Noted to have severe reduction in LV systolic function, EF less than 20%.   Assessment & Plan    1. Acute systolic heart failure with initial symptoms beginning 4 months ago.  LVEF less than 20% with global pattern and mild LVH.  Nice diuresis with improvement in symptoms.  Now needs optimization of heart failure therapy and ischemia workup to exclude CAD.  Switch to oral diuretic therapy starting in a.m.  Watch kidney function closely.  Coronary angiography planned for April 1 (a.m.) with discharge later that day if significant coronary disease is excluded. 2. Suspect nonischemic cardiomyopathy.  We will plan for left and right heart catheterization on April 1.  N.p.o. after midnight Sunday.  Procedure and risk of stroke, death, myocardial infarction, bleeding, kidney injury, among others were discussed and accepted by the patient.  She understands the rationale for proceeding with the procedure. 3. Undiagnosed hypertension, target less than 130/80 mmHg. 4. CKD stage III, stable 5. Hypokalemia, will be repleted.  The patient was counseled to undergo left and right heart catheterization, coronary angiography, and possible percutaneous coronary intervention with stent implantation. The procedural risks and benefits were discussed in detail. The risks discussed included  death, stroke, myocardial infarction, life-threatening bleeding, limb ischemia, kidney injury, allergy, and possible emergency cardiac surgery. The risk of these significant complications were estimated to occur less than 1% of the time. After discussion, the patient has agreed to proceed.  For questions or updates, please contact [place] Please consult [email or web address]  for contact info under Cardiology/STEMI.      Signed, Sinclair Grooms, MD  03/06/2018, 11:55 AM

## 2018-03-07 DIAGNOSIS — I5021 Acute systolic (congestive) heart failure: Secondary | ICD-10-CM | POA: Diagnosis not present

## 2018-03-07 LAB — BASIC METABOLIC PANEL
Anion gap: 12 (ref 5–15)
BUN: 28 mg/dL — ABNORMAL HIGH (ref 6–20)
CO2: 28 mmol/L (ref 22–32)
Calcium: 9.9 mg/dL (ref 8.9–10.3)
Chloride: 95 mmol/L — ABNORMAL LOW (ref 101–111)
Creatinine, Ser: 1.2 mg/dL — ABNORMAL HIGH (ref 0.44–1.00)
GFR calc Af Amer: 57 mL/min — ABNORMAL LOW (ref >60)
GFR calc non Af Amer: 50 mL/min — ABNORMAL LOW (ref >60)
Glucose, Bld: 104 mg/dL — ABNORMAL HIGH (ref 65–99)
Potassium: 4.1 mmol/L (ref 3.5–5.1)
Sodium: 135 mmol/L (ref 135–145)

## 2018-03-07 MED ORDER — FUROSEMIDE 40 MG PO TABS: 40.0000 mg | ORAL_TABLET | Freq: Every day | ORAL | Status: DC

## 2018-03-07 MED ORDER — CARVEDILOL 3.125 MG PO TABS: 3.1250 mg | ORAL_TABLET | Freq: Two times a day (BID) | ORAL | 5 refills | Status: DC

## 2018-03-07 MED ORDER — FUROSEMIDE 40 MG PO TABS: 40.0000 mg | ORAL_TABLET | Freq: Every day | ORAL | 5 refills | Status: DC

## 2018-03-07 NOTE — H&P (View-Only) (Signed)
Progress Note  Patient Name: Jordan [name] Date of Encounter: 03/07/2018  Primary Cardiologist:  Tamala Julian  Subjective   Feeling great she says.  She is ready to go home.  Walking.  Diuresis.  Inpatient Medications    Scheduled Meds: . aspirin EC  81 mg Oral Daily  . budesonide  0.25 mg Inhalation BID  . carvedilol  3.125 mg Oral BID WC  . furosemide  80 mg Intravenous BID  . heparin  5,000 Units Subcutaneous Q8H  . ipratropium  2 spray Each Nare BID  . lisinopril  2.5 mg Oral Daily  . multivitamin with minerals  1 tablet Oral Daily  . pneumococcal 23 valent vaccine  0.5 mL Intramuscular Tomorrow-1000  . pravastatin  40 mg Oral Daily  . sodium chloride flush  3 mL Intravenous Q12H  . venlafaxine XR  225 mg Oral Q breakfast   Continuous Infusions: . sodium chloride     PRN Meds: sodium chloride, acetaminophen, albuterol, ondansetron (ZOFRAN) IV, sodium chloride flush   Vital Signs    Vitals:   03/06/18 2053 03/07/18 0504 03/07/18 0802 03/07/18 0844  BP:  115/71  129/68  Pulse:  71  88  Resp:  18    Temp:  97.8 F (36.6 C)    TempSrc:  Oral    SpO2: 92% 96% 96% 96%  Weight:  148 lb 12.8 oz (67.5 kg)    Height:        Intake/Output Summary (Last 24 hours) at 03/07/2018 1217 Last data filed at 03/07/2018 0900 Gross per 24 hour  Intake 1083 ml  Output 2575 ml  Net -1492 ml   Filed Weights   03/05/18 0230 03/06/18 0424 03/07/18 0504  Weight: 152 lb 1.6 oz (69 kg) 148 lb 3.2 oz (67.2 kg) 148 lb 12.8 oz (67.5 kg)    Telemetry    No adverse arrhythmias- Personally Reviewed  ECG    No new EKG- Personally Reviewed  Physical Exam   GEN: No acute distress.  Pleasant Neck: No JVD Cardiac: RRR, no murmurs, rubs  Respiratory: Clear to auscultation bilaterally. GI: Soft, nontender, non-distended  MS: No edema; No deformity. Neuro:  Nonfocal  Psych: Normal affect   Labs    Chemistry Recent Labs  Lab 03/04/18 1830 03/05/18 0503 03/06/18 0649  03/07/18 0220  NA  --  138 134* 135  K 4.2 4.1 3.4* 4.1  CL  --  101 96* 95*  CO2  --  24 25 28   GLUCOSE  --  101* 106* 104*  BUN  --  21* 20 28*  CREATININE 1.09* 1.24* 1.14* 1.20*  CALCIUM  --  9.4 9.7 9.9  PROT 7.6  --   --   --   ALBUMIN 4.3  --   --   --   AST 60*  --   --   --   ALT 64*  --   --   --   ALKPHOS 107  --   --   --   BILITOT 0.8  --   --   --   GFRNONAA 56* 48* 53* 50*  GFRAA >60 55* >60 57*  ANIONGAP  --  13 13 12      Hematology Recent Labs  Lab 03/04/18 1158  WBC 6.9  RBC 4.45  HGB 13.9  HCT 42.5  MCV 95.5  MCH 31.2  MCHC 32.7  RDW 13.5  PLT 246    Cardiac Enzymes Recent Labs  Lab 03/04/18 1830 03/04/18  2308 03/05/18 0503  TROPONINI 0.06* 0.05* 0.07*    Recent Labs  Lab 03/04/18 1205  TROPIPOC 0.04     BNP Recent Labs  Lab 03/04/18 1158  BNP 4,449.1*     DDimer No results for input(s): DDIMER in the last 168 hours.   Radiology    No results found.  Cardiac Studies   - LVEF 15%, mildly dilated LV with mild LVH, severe global hypokinesis, dilated RV with mild hypokinesis, moderate to severe eccentric MR, severe LAE, mild RAE, mild TR, RVSP 34 mmHg, normal IVC.  Patient Profile     57 y.o. female here with acute systolic heart failure  Assessment & Plan    Acute systolic heart failure -About 4 months ago her symptoms were noted.  Ejection fraction is about 15%.  Creatinine is starting to rise gently as well as BUN.  We were going to change now to oral diuretic, Lasix 40 mg once a day.  I will also stop lisinopril with anticipation of transition to [facility] after heart catheterization.  - low dose coreg 3.125 mg twice a day continue to uptitrate as an outpatient.   She would really like to go home in fact she was told yesterday that she could possibly go home if she feels well and come back for the cardiac catheterization which is scheduled on Monday at 1:30 PM.  I am fine with her doing this.  [place] for DC  For  questions or updates, please contact [place] Please consult [email or web address] for contact info under Cardiology/STEMI.      Signed, Candee Furbish, MD  03/07/2018, 12:17 PM

## 2018-03-07 NOTE — Progress Notes (Signed)
Progress Note  Patient Name: Jordan [name] Date of Encounter: 03/07/2018  Primary Cardiologist:  Tamala Julian  Subjective   Feeling great she says.  She is ready to go home.  Walking.  Diuresis.  Inpatient Medications    Scheduled Meds: . aspirin EC  81 mg Oral Daily  . budesonide  0.25 mg Inhalation BID  . carvedilol  3.125 mg Oral BID WC  . furosemide  80 mg Intravenous BID  . heparin  5,000 Units Subcutaneous Q8H  . ipratropium  2 spray Each Nare BID  . lisinopril  2.5 mg Oral Daily  . multivitamin with minerals  1 tablet Oral Daily  . pneumococcal 23 valent vaccine  0.5 mL Intramuscular Tomorrow-1000  . pravastatin  40 mg Oral Daily  . sodium chloride flush  3 mL Intravenous Q12H  . venlafaxine XR  225 mg Oral Q breakfast   Continuous Infusions: . sodium chloride     PRN Meds: sodium chloride, acetaminophen, albuterol, ondansetron (ZOFRAN) IV, sodium chloride flush   Vital Signs    Vitals:   03/06/18 2053 03/07/18 0504 03/07/18 0802 03/07/18 0844  BP:  115/71  129/68  Pulse:  71  88  Resp:  18    Temp:  97.8 F (36.6 C)    TempSrc:  Oral    SpO2: 92% 96% 96% 96%  Weight:  148 lb 12.8 oz (67.5 kg)    Height:        Intake/Output Summary (Last 24 hours) at 03/07/2018 1217 Last data filed at 03/07/2018 0900 Gross per 24 hour  Intake 1083 ml  Output 2575 ml  Net -1492 ml   Filed Weights   03/05/18 0230 03/06/18 0424 03/07/18 0504  Weight: 152 lb 1.6 oz (69 kg) 148 lb 3.2 oz (67.2 kg) 148 lb 12.8 oz (67.5 kg)    Telemetry    No adverse arrhythmias- Personally Reviewed  ECG    No new EKG- Personally Reviewed  Physical Exam   GEN: No acute distress.  Pleasant Neck: No JVD Cardiac: RRR, no murmurs, rubs  Respiratory: Clear to auscultation bilaterally. GI: Soft, nontender, non-distended  MS: No edema; No deformity. Neuro:  Nonfocal  Psych: Normal affect   Labs    Chemistry Recent Labs  Lab 03/04/18 1830 03/05/18 0503 03/06/18 0649  03/07/18 0220  NA  --  138 134* 135  K 4.2 4.1 3.4* 4.1  CL  --  101 96* 95*  CO2  --  24 25 28   GLUCOSE  --  101* 106* 104*  BUN  --  21* 20 28*  CREATININE 1.09* 1.24* 1.14* 1.20*  CALCIUM  --  9.4 9.7 9.9  PROT 7.6  --   --   --   ALBUMIN 4.3  --   --   --   AST 60*  --   --   --   ALT 64*  --   --   --   ALKPHOS 107  --   --   --   BILITOT 0.8  --   --   --   GFRNONAA 56* 48* 53* 50*  GFRAA >60 55* >60 57*  ANIONGAP  --  13 13 12      Hematology Recent Labs  Lab 03/04/18 1158  WBC 6.9  RBC 4.45  HGB 13.9  HCT 42.5  MCV 95.5  MCH 31.2  MCHC 32.7  RDW 13.5  PLT 246    Cardiac Enzymes Recent Labs  Lab 03/04/18 1830 03/04/18  2308 03/05/18 0503  TROPONINI 0.06* 0.05* 0.07*    Recent Labs  Lab 03/04/18 1205  TROPIPOC 0.04     BNP Recent Labs  Lab 03/04/18 1158  BNP 4,449.1*     DDimer No results for input(s): DDIMER in the last 168 hours.   Radiology    No results found.  Cardiac Studies   - LVEF 15%, mildly dilated LV with mild LVH, severe global hypokinesis, dilated RV with mild hypokinesis, moderate to severe eccentric MR, severe LAE, mild RAE, mild TR, RVSP 34 mmHg, normal IVC.  Patient Profile     57 y.o. female here with acute systolic heart failure  Assessment & Plan    Acute systolic heart failure -About 4 months ago her symptoms were noted.  Ejection fraction is about 15%.  Creatinine is starting to rise gently as well as BUN.  We were going to change now to oral diuretic, Lasix 40 mg once a day.  I will also stop lisinopril with anticipation of transition to [facility] after heart catheterization.  - low dose coreg 3.125 mg twice a day continue to uptitrate as an outpatient.   She would really like to go home in fact she was told yesterday that she could possibly go home if she feels well and come back for the cardiac catheterization which is scheduled on Monday at 1:30 PM.  I am fine with her doing this.  [place] for DC  For  questions or updates, please contact [place] Please consult [email or web address] for contact info under Cardiology/STEMI.      Signed, Candee Furbish, MD  03/07/2018, 12:17 PM

## 2018-03-07 NOTE — Progress Notes (Signed)
Pt discharge home in stable condition via wheelchair, reviewed heart failure and dc instructions, all questions and entertained

## 2018-03-07 NOTE — Discharge Summary (Addendum)
Discharge Summary    Patient ID: Jordan [name],  MRN: [id number], DOB/AGE: 04/09/1961 57 y.o.  Admit date: 03/04/2018 Discharge date: 03/07/2018  Primary Care Provider: Gavin Pound Primary Cardiologist: No primary care provider on file.  Discharge Diagnoses    Active Problems:   Hypertension   Acute CHF (congestive heart failure) (HCC)   Acute systolic heart failure (HCC)   Allergies Allergies  Allergen Reactions  . Guaifenesin & Derivatives Hives  . Naprosyn [Naproxen] Hives    Diagnostic Studies/Procedures    Echocardiogram 03/06/2018 Study Conclusions   - Left ventricle: The cavity size was mildly dilated. Wall   thickness was increased in a pattern of mild LVH. LV mid   ventricle false tendon. The estimated ejection fraction was 15%.   Diffuse hypokinesis. The study is not technically sufficient to   allow evaluation of LV diastolic function. - Mitral valve: Mildly thickened leaflets . There was moderate to   severe, eccentric regurgitation. - Left atrium: Severely dilated. - Right ventricle: The cavity size was mildly dilated. Mild   systolic dysfunction. - Right atrium: The atrium was mildly dilated. - Tricuspid valve: There was mild regurgitation. - Pulmonary arteries: PA peak pressure: 34 mm Hg (S). - Inferior vena cava: The vessel was normal in size. The   respirophasic diameter changes were in the normal range (>= 50%), consistent with normal central venous pressure.  Impressions: - LVEF 15%, mildly dilated LV with mild LVH, severe global   hypokinesis, dilated RV with mild hypokinesis, moderate to severe eccentric MR, severe LAE, mild RAE, mild TR, RVSP 34 mmHg, normal  IVC. _____________   History of Present Illness     Jordan [name] is a 56 y.o. female with history of remote mitral valve prolapse, HTN, pre-diabetes, hyponatremia 2014, arthritis, ongoing tobacco abuse, and family history of CAD who presented to [facility] with SOB.  She reports  a very remote hx of MVP diagnosed decades ago, without need for follow-up until recently. In November she developed shortness of breath and cough so was treated for a URI on multiple occasions. Finally about 3 weeks ago after being treated with an antibiotic and nasal spray, she saw near-complete relief - she had been told they saw "2 spots of pneumonia". She did not have any fevers or chills at that time but had general chest congestion and dyspnea with those illnesses. She recently saw her primary care at [facility] who noted enlargement of her heart on imaging and ordered an echo which was actually pending for tomorrow. About 8 days ago she developed recurrent worsening of shortness of breath, this time associated with orthopnea, PND, and thick white sputum cough. She saw primary care again and got an RX for albuterol inhaler and nasal fluticasone again, but it hasn't helped. She denies any LEE or abdominal distention. She denies any alcohol or illicit drug use. Her father had 5v CABG in his 7s but he was also diabetic. Due to persistent dyspnea she presented to the ER today. On the way here had 45 seconds of sharp atypical CP but otherwise has not had chest pain recently. She reports recent 7lb weight loss but states she's been trying to eat healthier. She also recently got an rx for Chantix as she was ready to quit smoking but has not started this yet. Workup in the ED reveals mild HTN to 153/92 (pt states usually controlled), afebrile, HR 90s, CXR suggestive of cardiomegaly with interstitial pulmonary edema and small bilateral pleural  effusions as well as basilar airspace disease felt likely due to atelectasis. Labs showed K 5.2 (hemolyzed), glucose 128, BUN/Cr 18/0.99, tropnin negative, lactic acid slightly elevated at 2.18, CBC wnl, BNP 4449.   Hospital Course     Consultants: None  1. New onset acute heart failure: -LVEF less than 20% with global pattern and mild LVH.  Nice diuresis with improvement in  symptoms. -Trop elevated despite absence of chest pain, 0.06, 0.05, 0.07 -Weight, 148lb today, 155lb on admission  -Sodium restricted diet and 2L fluid restrict intake -Low dose carvedilol added. Will titrate as outpatient.  -Creatinine is worsened>> no ACE-I for now. SCr 1.24, 1.20 at discharge. -Anticipate starting Entresto after heart cath.  -Planned for R&L heart cath on Monday at 1:30. Exclude ischemic causes of cardiomyopathy and assess pressures and valves. The patient understands that risks included but are not limited to stroke (1 in 1000), death (1 in 43), kidney failure [usually temporary] (1 in 500), bleeding (1 in 200), allergic reaction [possibly serious] (1 in 200). Pt feels well and wants to go home and return on Monday for procedure.   2. Essential HTN -BP initially elevated, but now well controlled and even soft. Low dose BB started. Will follow BP as outpatient and try to start Entresto as BP allows.   3. Hyperlipidemia: -Lipid panel from 03/05/18 -CHO-130, HDL-45. LDL-66, Trig-96 -Continue pravastatin  4. Pre-diabetes: - Blood sugars well controlled while here. Will need Hgb A1c.   5. Hyperkalemia: -K+ today 4.1 -Previous specimen thought to be hemolyzed (5.2 on admission)  6. Tobacco abuse: -Encouraged smoking cessation  -She has Chantix already and can begin use.    Patient has been seen by Dr. Marlou Porch today and deemed ready for discharge home. All follow up appointments have been scheduled. Discharge medications are listed below. _____________  Discharge Vitals Blood pressure 122/74, pulse 89, temperature 97.6 F (36.4 C), temperature source Oral, resp. rate 18, height 5\' 6"  (1.676 m), weight 148 lb 12.8 oz (67.5 kg), SpO2 100 %.  Filed Weights   03/05/18 0230 03/06/18 0424 03/07/18 0504  Weight: 152 lb 1.6 oz (69 kg) 148 lb 3.2 oz (67.2 kg) 148 lb 12.8 oz (67.5 kg)    Labs & Radiologic Studies    CBC No results for input(s): WBC, NEUTROABS,  HGB, HCT, MCV, PLT in the last 72 hours. Basic Metabolic Panel Recent Labs    03/06/18 0649 03/07/18 0220  NA 134* 135  K 3.4* 4.1  CL 96* 95*  CO2 25 28  GLUCOSE 106* 104*  BUN 20 28*  CREATININE 1.14* 1.20*  CALCIUM 9.7 9.9   Liver Function Tests Recent Labs    03/04/18 1830  AST 60*  ALT 64*  ALKPHOS 107  BILITOT 0.8  PROT 7.6  ALBUMIN 4.3   No results for input(s): LIPASE, AMYLASE in the last 72 hours. Cardiac Enzymes Recent Labs    03/04/18 1830 03/04/18 2308 03/05/18 0503  TROPONINI 0.06* 0.05* 0.07*   BNP Invalid input(s): POCBNP D-Dimer No results for input(s): DDIMER in the last 72 hours. Hemoglobin A1C No results for input(s): HGBA1C in the last 72 hours. Fasting Lipid Panel Recent Labs    03/05/18 0503  CHOL 130  HDL 45  LDLCALC 66  TRIG 96  CHOLHDL 2.9   Thyroid Function Tests Recent Labs    03/04/18 1830  TSH 2.436   _____________  Dg Chest 2 View  Result Date: 03/04/2018 CLINICAL DATA:  Shortness of breath and productive cough  for 3 days. EXAM: CHEST - 2 VIEW COMPARISON:  PA and lateral chest 01/16/2018 and 08/20/2013. FINDINGS: There is cardiomegaly and interstitial pulmonary edema. Small bilateral pleural effusions are seen. Basilar airspace disease is likely atelectasis. No pneumothorax. IMPRESSION: Cardiomegaly and pulmonary edema. Small bilateral pleural effusions and basilar airspace disease which is likely due to atelectasis. Electronically Signed   By: Inge Rise M.D.   On: 03/04/2018 11:41   Disposition   Pt is being discharged home today in good condition.  Follow-up Plans & Appointments    Discharge Instructions    (HEART FAILURE PATIENTS) Call MD:  Anytime you have any of the following symptoms: 1) 3 pound weight gain in 24 hours or 5 pounds in 1 week 2) shortness of breath, with or without a dry hacking cough 3) swelling in the hands, feet or stomach 4) if you have to sleep on extra pillows at night in order to  breathe.   Complete by:  As directed    Diet - low sodium heart healthy   Complete by:  As directed    Discharge instructions   Complete by:  As directed    You will be called on Monday morning with instructions for cardiac catheterization that is scheduled for 1:30 pm. Do not eat or drink anything after midnight on Sunday night except for medications with a sip of water.  Do not take lasix on the morning of the procedure (Monday morning).   Increase activity slowly   Complete by:  As directed       Discharge Medications   Allergies as of 03/07/2018      Reactions   Guaifenesin & Derivatives Hives   Naprosyn [naproxen] Hives      Medication List    TAKE these medications   albuterol 108 (90 Base) MCG/ACT inhaler Commonly known as:  PROVENTIL HFA;VENTOLIN HFA Inhale 2 puffs into the lungs every 6 (six) hours as needed for wheezing or shortness of breath.   aspirin EC 81 MG tablet Take 81 mg by mouth daily. What changed:  Another medication with the same name was removed. Continue taking this medication, and follow the directions you see here.   CALCIUM PO Take 1 tablet by mouth daily.   carvedilol 3.125 MG tablet Commonly known as:  COREG Take 1 tablet (3.125 mg total) by mouth 2 (two) times daily with a meal.   FLOVENT HFA 110 MCG/ACT inhaler Generic drug:  fluticasone Inhale 1 puff into the lungs 2 (two) times daily.   furosemide 40 MG tablet Commonly known as:  LASIX Take 1 tablet (40 mg total) by mouth daily.   ipratropium 0.06 % nasal spray Commonly known as:  ATROVENT Place 2 sprays into both nostrils 2 (two) times daily.   lactobacillus acidophilus Tabs tablet Take 1 tablet by mouth as needed (digestion aid).   methocarbamol 500 MG tablet Commonly known as:  ROBAXIN Take 1 tablet (500 mg total) by mouth 2 (two) times daily with a meal.   multivitamin with minerals tablet Take 1 tablet by mouth daily.   pravastatin 40 MG tablet Commonly known as:   PRAVACHOL Take 40 mg by mouth daily.   promethazine-dextromethorphan 6.25-15 MG/5ML syrup Commonly known as:  PROMETHAZINE-DM Take 5 mLs by mouth 4 (four) times daily as needed for cough.   varenicline 0.5 MG tablet Commonly known as:  CHANTIX Take 0.5 mg by mouth daily.   Venlafaxine HCl 225 MG Tb24 Take 150 mg by mouth daily.  Outstanding Labs/Studies   BMet Monday morning prior to cath.   Duration of Discharge Encounter   Greater than 30 minutes including physician time.  Signed, Daune Perch NP 03/07/2018, 4:41 PM   Primary Cardiologist:  Tamala Julian  Subjective   Feeling great she says.  She is ready to go home.  Walking.  Diuresis.  Inpatient Medications    Scheduled Meds: . aspirin EC  81 mg Oral Daily  . budesonide  0.25 mg Inhalation BID  . carvedilol  3.125 mg Oral BID WC  . furosemide  80 mg Intravenous BID  . heparin  5,000 Units Subcutaneous Q8H  . ipratropium  2 spray Each Nare BID  . lisinopril  2.5 mg Oral Daily  . multivitamin with minerals  1 tablet Oral Daily  . pneumococcal 23 valent vaccine  0.5 mL Intramuscular Tomorrow-1000  . pravastatin  40 mg Oral Daily  . sodium chloride flush  3 mL Intravenous Q12H  . venlafaxine XR  225 mg Oral Q breakfast   Continuous Infusions: . sodium chloride     PRN Meds: sodium chloride, acetaminophen, albuterol, ondansetron (ZOFRAN) IV, sodium chloride flush   Vital Signs          Vitals:   03/06/18 2053 03/07/18 0504 03/07/18 0802 03/07/18 0844  BP:  115/71  129/68  Pulse:  71  88  Resp:  18    Temp:  97.8 F (36.6 C)    TempSrc:  Oral    SpO2: 92% 96% 96% 96%  Weight:  148 lb 12.8 oz (67.5 kg)    Height:        Intake/Output Summary (Last 24 hours) at 03/07/2018 1217 Last data filed at 03/07/2018 0900    Gross per 24 hour  Intake 1083 ml  Output 2575 ml  Net -1492 ml        Filed Weights   03/05/18 0230 03/06/18 0424 03/07/18 0504  Weight: 152 lb  1.6 oz (69 kg) 148 lb 3.2 oz (67.2 kg) 148 lb 12.8 oz (67.5 kg)    Telemetry    No adverse arrhythmias- Personally Reviewed  ECG    No new EKG- Personally Reviewed  Physical Exam   GEN:No acute distress.  Pleasant Neck:No JVD Cardiac:RRR, no murmurs, rubs  Respiratory:Clear to auscultation bilaterally. XT:KWIO, nontender, non-distended  MS:No edema; No deformity. Neuro:Nonfocal  Psych: Normal affect   Labs    Chemistry LastLabs        Recent Labs  Lab 03/04/18 1830 03/05/18 0503 03/06/18 0649 03/07/18 0220  NA  --  138 134* 135  K 4.2 4.1 3.4* 4.1  CL  --  101 96* 95*  CO2  --  24 25 28   GLUCOSE  --  101* 106* 104*  BUN  --  21* 20 28*  CREATININE 1.09* 1.24* 1.14* 1.20*  CALCIUM  --  9.4 9.7 9.9  PROT 7.6  --   --   --   ALBUMIN 4.3  --   --   --   AST 60*  --   --   --   ALT 64*  --   --   --   ALKPHOS 107  --   --   --   BILITOT 0.8  --   --   --   GFRNONAA 56* 48* 53* 50*  GFRAA >60 55* >60 57*  ANIONGAP  --  13 13 12        Hematology LastLabs     Recent Labs  Lab 03/04/18 1158  WBC 6.9  RBC 4.45  HGB 13.9  HCT 42.5  MCV 95.5  MCH 31.2  MCHC 32.7  RDW 13.5  PLT 246      Cardiac Enzymes LastLabs       Recent Labs  Lab 03/04/18 1830 03/04/18 2308 03/05/18 0503  TROPONINI 0.06* 0.05* 0.07*      LastLabs     Recent Labs  Lab 03/04/18 1205  TROPIPOC 0.04       BNP LastLabs     Recent Labs  Lab 03/04/18 1158  BNP 4,449.1*       DDimer  LastLabs  No results for input(s): DDIMER in the last 168 hours.     Radiology    ImagingResults(Last48hours)  No results found.    Cardiac Studies   - LVEF 15%, mildly dilated LV with mild LVH, severe global hypokinesis, dilated RV with mild hypokinesis, moderate to severe eccentric MR, severe LAE, mild RAE, mild TR, RVSP 34 mmHg, normal IVC.  Patient Profile     57 y.o. female here with acute systolic heart  failure  Assessment & Plan    Acute systolic heart failure -About 4 months ago her symptoms were noted.  Ejection fraction is about 15%.  Creatinine is starting to rise gently as well as BUN.  We were going to change now to oral diuretic, Lasix 40 mg once a day.  I will also stop lisinopril with anticipation of transition to [facility] after heart catheterization.  - low dose coreg 3.125 mg twice a day continue to uptitrate as an outpatient.   She would really like to go home in fact she was told yesterday that she could possibly go home if she feels well and come back for the cardiac catheterization which is scheduled on Monday at 1:30 PM.  I am fine with her doing this.  [place] for DC  For questions or updates, please contact [place] Please consult [email or web address] for contact info under Cardiology/STEMI.      Signed, Candee Furbish, MD

## 2018-03-09 ENCOUNTER — Ambulatory Visit (HOSPITAL_COMMUNITY)
Admission: RE | Admit: 2018-03-09 | Discharge: 2018-03-09 | Disposition: A | Discharge status: Home / Self Care | Payer: Federal, State, Local not specified - PPO | Source: Ambulatory Visit | Attending: Internal Medicine | Admitting: Internal Medicine

## 2018-03-09 ENCOUNTER — Encounter (HOSPITAL_COMMUNITY)
Admission: RE | Disposition: A | Discharge status: Home / Self Care | Payer: Self-pay | Source: Ambulatory Visit | Attending: Internal Medicine

## 2018-03-09 ENCOUNTER — Inpatient Hospital Stay (HOSPITAL_COMMUNITY): Admission: RE | Admit: 2018-03-09 | Payer: Federal, State, Local not specified - PPO | Source: Ambulatory Visit

## 2018-03-09 DIAGNOSIS — I5023 Acute on chronic systolic (congestive) heart failure: Secondary | ICD-10-CM | POA: Diagnosis not present

## 2018-03-09 DIAGNOSIS — Z79899 Other long term (current) drug therapy: Secondary | ICD-10-CM | POA: Insufficient documentation

## 2018-03-09 DIAGNOSIS — I5021 Acute systolic (congestive) heart failure: Secondary | ICD-10-CM | POA: Insufficient documentation

## 2018-03-09 HISTORY — PX: RIGHT/LEFT HEART CATH AND CORONARY ANGIOGRAPHY: CATH118266

## 2018-03-09 LAB — POCT I-STAT 3, ART BLOOD GAS (G3+)
Acid-Base Excess: 2 mmol/L (ref 0.0–2.0)
Bicarbonate: 25.9 mmol/L (ref 20.0–28.0)
O2 Saturation: 96 %
TCO2: 27 mmol/L (ref 22–32)
pCO2 arterial: 38.4 mmHg (ref 32.0–48.0)
pH, Arterial: 7.436 (ref 7.350–7.450)
pO2, Arterial: 79 mmHg — ABNORMAL LOW (ref 83.0–108.0)

## 2018-03-09 LAB — POCT I-STAT 3, VENOUS BLOOD GAS (G3P V)
Acid-Base Excess: 2 mmol/L (ref 0.0–2.0)
Bicarbonate: 26.9 mmol/L (ref 20.0–28.0)
O2 Saturation: 62 %
TCO2: 28 mmol/L (ref 22–32)
pCO2, Ven: 41.6 mmHg — ABNORMAL LOW (ref 44.0–60.0)
pH, Ven: 7.419 (ref 7.250–7.430)
pO2, Ven: 32 mmHg (ref 32.0–45.0)

## 2018-03-09 LAB — PROTIME-INR
INR: 1.01
Prothrombin Time: 13.2 seconds (ref 11.4–15.2)

## 2018-03-09 SURGERY — RIGHT/LEFT HEART CATH AND CORONARY ANGIOGRAPHY
Anesthesia: LOCAL

## 2018-03-09 MED ORDER — MIDAZOLAM HCL 2 MG/2ML IJ SOLN
INTRAMUSCULAR | Status: AC
  Filled 2018-03-09: qty 2

## 2018-03-09 MED ORDER — VERAPAMIL HCL 2.5 MG/ML IV SOLN
INTRAVENOUS | Status: AC
  Filled 2018-03-09: qty 2

## 2018-03-09 MED ORDER — HEPARIN SODIUM (PORCINE) 1000 UNIT/ML IJ SOLN
INTRAMUSCULAR | Status: DC | PRN
  Administered 2018-03-09: 3500 [IU] via INTRAVENOUS

## 2018-03-09 MED ORDER — SODIUM CHLORIDE 0.9 % WEIGHT BASED INFUSION
3.0000 mL/kg/h | INTRAVENOUS | Status: DC
  Administered 2018-03-09: 3 mL/kg/h via INTRAVENOUS

## 2018-03-09 MED ORDER — SODIUM CHLORIDE 0.9 % IV SOLN: 250.0000 mL | INTRAVENOUS | Status: DC | PRN

## 2018-03-09 MED ORDER — ASPIRIN 81 MG PO CHEW: 81.0000 mg | CHEWABLE_TABLET | ORAL | Status: DC

## 2018-03-09 MED ORDER — SODIUM CHLORIDE 0.9 % WEIGHT BASED INFUSION: 1.0000 mL/kg/h | INTRAVENOUS | Status: DC

## 2018-03-09 MED ORDER — MIDAZOLAM HCL 2 MG/2ML IJ SOLN
INTRAMUSCULAR | Status: DC | PRN
  Administered 2018-03-09: 0.5 mg via INTRAVENOUS

## 2018-03-09 MED ORDER — LIDOCAINE HCL (PF) 1 % IJ SOLN
INTRAMUSCULAR | Status: DC | PRN
  Administered 2018-03-09: 5 mL

## 2018-03-09 MED ORDER — LIDOCAINE HCL 1 % IJ SOLN
INTRAMUSCULAR | Status: AC
  Filled 2018-03-09: qty 20

## 2018-03-09 MED ORDER — SODIUM CHLORIDE 0.9% FLUSH: 3.0000 mL | Freq: Two times a day (BID) | INTRAVENOUS | Status: DC

## 2018-03-09 MED ORDER — FENTANYL CITRATE (PF) 100 MCG/2ML IJ SOLN
INTRAMUSCULAR | Status: AC
  Filled 2018-03-09: qty 2

## 2018-03-09 MED ORDER — HEPARIN (PORCINE) IN NACL 2-0.9 UNIT/ML-% IJ SOLN
INTRAMUSCULAR | Status: AC
  Filled 2018-03-09: qty 1000

## 2018-03-09 MED ORDER — IODIXANOL 320 MG/ML IV SOLN
INTRAVENOUS | Status: DC | PRN
  Administered 2018-03-09: 50 mL via INTRA_ARTERIAL

## 2018-03-09 MED ORDER — ACETAMINOPHEN 325 MG PO TABS: 650.0000 mg | ORAL_TABLET | ORAL | Status: DC | PRN

## 2018-03-09 MED ORDER — SODIUM CHLORIDE 0.9% FLUSH: 3.0000 mL | INTRAVENOUS | Status: DC | PRN

## 2018-03-09 MED ORDER — HEPARIN SODIUM (PORCINE) 1000 UNIT/ML IJ SOLN
INTRAMUSCULAR | Status: AC
  Filled 2018-03-09: qty 1

## 2018-03-09 MED ORDER — LISINOPRIL 5 MG PO TABS: 2.5000 mg | ORAL_TABLET | Freq: Every day | ORAL | 5 refills | Status: DC

## 2018-03-09 MED ORDER — SODIUM CHLORIDE 0.9 % IV SOLN: INTRAVENOUS | Status: DC

## 2018-03-09 MED ORDER — HEPARIN (PORCINE) IN NACL 2-0.9 UNIT/ML-% IJ SOLN
INTRAMUSCULAR | Status: AC | PRN
  Administered 2018-03-09 (×2): 500 mL via INTRA_ARTERIAL

## 2018-03-09 MED ORDER — HEPARIN (PORCINE) IN NACL 2-0.9 UNIT/ML-% IJ SOLN
INTRAMUSCULAR | Status: DC | PRN
  Administered 2018-03-09: 10 mL via INTRA_ARTERIAL

## 2018-03-09 MED ORDER — FENTANYL CITRATE (PF) 100 MCG/2ML IJ SOLN
INTRAMUSCULAR | Status: DC | PRN
  Administered 2018-03-09: 25 ug via INTRAVENOUS

## 2018-03-09 MED ORDER — ONDANSETRON HCL 4 MG/2ML IJ SOLN: 4.0000 mg | Freq: Four times a day (QID) | INTRAMUSCULAR | Status: DC | PRN

## 2018-03-09 SURGICAL SUPPLY — 14 items
BAND CMPR LRG ZPHR (HEMOSTASIS) ×1
BAND ZEPHYR COMPRESS 30 LONG (HEMOSTASIS) ×1 IMPLANT
CATH IMPULSE 5F ANG/FL3.5 (CATHETERS) ×1 IMPLANT
CATH SWAN GANZ 7F STRAIGHT (CATHETERS) ×1 IMPLANT
GLIDESHEATH SLENDER 7FR .021G (SHEATH) ×1 IMPLANT
GUIDEWIRE INQWIRE 1.5J.035X260 (WIRE) IMPLANT
INQWIRE 1.5J .035X260CM (WIRE) ×2
KIT HEART LEFT (KITS) ×2 IMPLANT
NDL PERC 21GX4CM (NEEDLE) IMPLANT
NEEDLE PERC 21GX4CM (NEEDLE) ×2 IMPLANT
PACK CARDIAC CATHETERIZATION (CUSTOM PROCEDURE TRAY) ×2 IMPLANT
SHEATH RAIN RADIAL 21G 6FR (SHEATH) ×1 IMPLANT
TRANSDUCER W/STOPCOCK (MISCELLANEOUS) ×2 IMPLANT
TUBING CIL FLEX 10 FLL-RA (TUBING) ×2 IMPLANT

## 2018-03-09 NOTE — Brief Op Note (Signed)
BRIEF CARDIAC CATHETERIZATION NOTE  DATE: 03/09/2018 TIME: 5:21 PM  PATIENT:  Jordan [name]  57 y.o. female  PRE-OPERATIVE DIAGNOSIS:  Acute systolic heart failure  POST-OPERATIVE DIAGNOSIS:  NICM  PROCEDURE:  Procedure(s): RIGHT/LEFT HEART CATH AND CORONARY ANGIOGRAPHY (N/A)  SURGEON:  Surgeon(s) and Role:    Nelva Bush, MD - Primary  FINDINGS: 1. No angiographically significant CAD. 2. Normal left and right heart filling pressures. 3. Mild decreased cardiac output.  RECOMMENDATIONS: 1. Medical therapy for NICM. Continue current medications and start lisinopril 2.5 mg daily. 2. Outpatient cardiology follow-up and BMP in 1 weeks.  Nelva Bush, MD [facility] Pager: [phone]

## 2018-03-09 NOTE — Progress Notes (Signed)
Right brachial venous sheath removed and pressure held x 29min; no bleeding or hematoma

## 2018-03-09 NOTE — Interval H&P Note (Signed)
History and Physical Interval Note:  03/09/2018 4:31 PM  Jordan [name]  has presented today for cardiac catheterization, with the diagnosis of acute systolic heart failure. The various methods of treatment have been discussed with the patient and family. After consideration of risks, benefits and other options for treatment, the patient has consented to  Procedure(s): RIGHT/LEFT HEART CATH AND CORONARY ANGIOGRAPHY (N/A) as a surgical intervention .  The patient's history has been reviewed, patient examined, no change in status, stable for surgery.  I have reviewed the patient's chart and labs.  Questions were answered to the patient's satisfaction.    Cath Lab Visit (complete for each Cath Lab visit)  Clinical Evaluation Leading to the Procedure:   ACS: No.  Non-ACS:    Anginal Classification: CCS IV (NYHA class IV)  Anti-ischemic medical therapy: Minimal Therapy (1 class of medications)  Non-Invasive Test Results: No stress test. LVEF 15% by echo.  Prior CABG: No previous CABG  Jordan [name]

## 2018-03-09 NOTE — Discharge Instructions (Signed)

## 2018-03-10 ENCOUNTER — Other Ambulatory Visit: Payer: Self-pay | Admitting: Cardiology

## 2018-03-10 ENCOUNTER — Encounter (HOSPITAL_COMMUNITY): Payer: Self-pay | Admitting: Internal Medicine

## 2018-03-10 DIAGNOSIS — I5043 Acute on chronic combined systolic (congestive) and diastolic (congestive) heart failure: Secondary | ICD-10-CM

## 2018-03-10 MED FILL — Heparin Sodium (Porcine) 2 Unit/ML in Sodium Chloride 0.9%: INTRAMUSCULAR | Qty: 1000 | Status: AC

## 2018-03-10 MED FILL — Lidocaine HCl Local Inj 1%: INTRAMUSCULAR | Qty: 20 | Status: AC

## 2018-03-17 ENCOUNTER — Other Ambulatory Visit: Payer: Federal, State, Local not specified - PPO

## 2018-03-17 DIAGNOSIS — I5043 Acute on chronic combined systolic (congestive) and diastolic (congestive) heart failure: Secondary | ICD-10-CM | POA: Diagnosis not present

## 2018-03-17 LAB — BASIC METABOLIC PANEL
BUN/Creatinine Ratio: 16 (ref 9–23)
BUN: 17 mg/dL (ref 6–24)
CO2: 24 mmol/L (ref 20–29)
Calcium: 10.9 mg/dL — ABNORMAL HIGH (ref 8.7–10.2)
Chloride: 94 mmol/L — ABNORMAL LOW (ref 96–106)
Creatinine, Ser: 1.04 mg/dL — ABNORMAL HIGH (ref 0.57–1.00)
GFR calc Af Amer: 69 mL/min/{1.73_m2} (ref >59)
GFR calc non Af Amer: 60 mL/min/{1.73_m2} (ref >59)
Glucose: 86 mg/dL (ref 65–99)
Potassium: 4.8 mmol/L (ref 3.5–5.2)
Sodium: 136 mmol/L (ref 134–144)

## 2018-03-23 ENCOUNTER — Encounter (HOSPITAL_COMMUNITY): Payer: Self-pay | Admitting: Internal Medicine

## 2018-03-24 NOTE — Progress Notes (Signed)
CARDIOLOGY OFFICE NOTE  Date:  03/25/2018    Jordan [name] Date of Birth: Aug 03, 1961 Medical Record #[id number]  PCP:  Marda Stalker, PA-C  Cardiologist:  Tamala Julian   Chief Complaint  Patient presents with  . Coronary Artery Disease  . Congestive Heart Failure    Post hospital visit - seen for Dr.     History of Present Illness: Jordan [name] is a 57 y.o. female who presents today for a post hospital visit. Seen for Dr. Tamala Julian.   She has a history ofremote mitral valve prolapse, HTN, pre-diabetes, hyponatremia 2014, arthritis, ongoing tobacco abuse, and family history of CAD.   She presented last month with SOB.  She reports a very remote hx of MVP diagnosed decades ago, without need for follow-up until recently. In November she developed shortness of breath and cough so was treated for a URI on multiple occasions. Finally about 3 weeks ago after being treated with an antibiotic and nasal spray, she saw near-complete relief - she had been told they saw "2 spots of pneumonia". She did not have any fevers or chills at that time but had general chest congestion and dyspnea with those illnesses. She recently saw her primary care at [facility] who noted enlargement of her heart on imaging and ordered an echo.  She then developed recurrent worsening of shortness of breath, this time associated with orthopnea, PND, and thick white sputum cough. She saw primary care again and got an RX for albuterol inhaler and nasal fluticasone again, but had no relief. FH is + for CAD. Her father had 5v CABG in his 36s but he was also diabetic. Due to the persistent dyspnea she presented to the ER. On the way here had 45 seconds of sharp atypical CP but otherwise has not had chest pain recently. She had had a 7lb weight loss prior to admission - stated that she had been trying to eat healthier.  Workup in the ED reveals mild HTN to 153/92 with HR 90s, CXR suggestive of cardiomegaly with interstitial pulmonary  edema and small bilateral pleural effusions as well asbasilar airspace diseasefelt likely due to atelectasis.She was subsequently admitted for further evaluation.   Echo showed EF down to 20%. She was diuresed. No ACE due to CKD. Low dose beta blocker was started. She was discharged with plans for outpatient cath.  R & L heart cath - this was basically normal.   Comes in today. Here alone. She says she is doing great. States she is "100% better". Not short of breath. No chest pain. Not dizzy or lightheaded. Tolerating her medicines. Restricting her salt very nicely. Weights are stable. No swelling. Smoking less and would like to start Chantix. She is now able to go and walk her dogs. She is very pleased with how she is doing.   Past Medical History:  Diagnosis Date  . Arthritis   . Arthritis of knee, left 08/25/2013  . Cardiomegaly 03/02/2018  . Hypertension   . Hyponatremia 08/26/2013  . MVP (mitral valve prolapse)   . Pre-diabetes   . Tobacco abuse     Past Surgical History:  Procedure Laterality Date  . ABDOMINAL HYSTERECTOMY  94  . APPENDECTOMY    . CERVICAL DISCECTOMY  08  . KNEE ARTHROSCOPY Right 09,and ?  . RIGHT/LEFT HEART CATH AND CORONARY ANGIOGRAPHY N/A 03/09/2018   Procedure: RIGHT/LEFT HEART CATH AND CORONARY ANGIOGRAPHY;  Surgeon: Nelva Bush, MD;  Location: [place] [facility];  Service: Cardiovascular;  Laterality: N/A;  . SHOULDER ARTHROSCOPY Left    bone spur  . TOTAL KNEE ARTHROPLASTY Right 08/25/2013   Procedure: TOTAL KNEE ARTHROPLASTY;  Surgeon: Kerin Salen, MD;  Location: [place];  Service: Orthopedics;  Laterality: Right;     Medications: Current Meds  Medication Sig  . albuterol (PROVENTIL HFA;VENTOLIN HFA) 108 (90 Base) MCG/ACT inhaler Inhale 2 puffs into the lungs every 6 (six) hours as needed for wheezing or shortness of breath.  Marland Kitchen aspirin EC 81 MG tablet Take 81 mg by mouth daily.  Marland Kitchen CALCIUM PO Take 1 tablet by mouth daily.  . carvedilol (COREG)  3.125 MG tablet Take 1 tablet (3.125 mg total) by mouth 2 (two) times daily with a meal.  . CHANTIX CONTINUING MONTH PAK 1 MG tablet Take 1 mg by mouth daily.   Marland Kitchen FLOVENT HFA 110 MCG/ACT inhaler Inhale 1 puff into the lungs 2 (two) times daily.  . furosemide (LASIX) 40 MG tablet Take 0.5 tablets (20 mg total) by mouth daily.  Marland Kitchen ipratropium (ATROVENT) 0.06 % nasal spray Place 2 sprays into both nostrils 2 (two) times daily.  Marland Kitchen lactobacillus acidophilus (BACID) TABS tablet Take 1 tablet by mouth as needed (digestion aid).  . Multiple Vitamins-Minerals (MULTIVITAMIN WITH MINERALS) tablet Take 1 tablet by mouth daily.  . pravastatin (PRAVACHOL) 40 MG tablet Take 40 mg by mouth daily.  . varenicline (CHANTIX) 0.5 MG tablet Take 0.5 mg by mouth daily.  . Venlafaxine HCl 225 MG TB24 Take 225 mg by mouth daily.   . [DISCONTINUED] furosemide (LASIX) 40 MG tablet Take 1 tablet (40 mg total) by mouth daily.  . [DISCONTINUED] lisinopril (PRINIVIL,ZESTRIL) 5 MG tablet Take 0.5 tablets (2.5 mg total) by mouth daily.     Allergies: Allergies  Allergen Reactions  . Guaifenesin & Derivatives Hives  . Naprosyn [Naproxen] Hives    Social History: The patient  reports that she has been smoking cigarettes.  She has a 20.00 pack-year smoking history. She has never used smokeless tobacco. She reports that she drinks about 0.5 oz of alcohol per week. She reports that she does not use drugs.   Family History: The patient's family history includes CAD in her father; Diabetes in her brother and father; Hypertension in her father; Liver disease in her sister.   Review of Systems: Please see the history of present illness.   Otherwise, the review of systems is positive for none.   All other systems are reviewed and negative.   Physical Exam: VS:  BP 118/76 (BP Location: Left Arm, Patient Position: Sitting, Cuff Size: Normal)   Pulse 90   Ht 5' 5.5" (1.664 m)   Wt 151 lb 12.8 oz (68.9 kg)   SpO2 100% Comment:  at rest  BMI 24.88 kg/m  .  BMI Body mass index is 24.88 kg/m.  Wt Readings from Last 3 Encounters:  03/25/18 151 lb 12.8 oz (68.9 kg)  03/09/18 148 lb (67.1 kg)  03/07/18 148 lb 12.8 oz (67.5 kg)    General: Pleasant. Well developed, well nourished and in no acute distress.   HEENT: Normal.  Neck: Supple, no JVD, carotid bruits, or masses noted.  Cardiac: Regular rate and rhythm. Systolic murmur. No edema.  Respiratory:  Lungs are clear to auscultation bilaterally with normal work of breathing.  GI: Soft and nontender.  MS: No deformity or atrophy. Gait and ROM intact.  Skin: Warm and dry. Color is normal.  Neuro:  Strength and sensation are intact and no  gross focal deficits noted.  Psych: Alert, appropriate and with normal affect.   LABORATORY DATA:  EKG:  EKG is not ordered today.  Lab Results  Component Value Date   WBC 6.9 03/04/2018   HGB 13.9 03/04/2018   HCT 42.5 03/04/2018   PLT 246 03/04/2018   GLUCOSE 86 03/17/2018   CHOL 130 03/05/2018   TRIG 96 03/05/2018   HDL 45 03/05/2018   LDLCALC 66 03/05/2018   ALT 64 (H) 03/04/2018   AST 60 (H) 03/04/2018   NA 136 03/17/2018   K 4.8 03/17/2018   CL 94 (L) 03/17/2018   CREATININE 1.04 (H) 03/17/2018   BUN 17 03/17/2018   CO2 24 03/17/2018   TSH 2.436 03/04/2018   INR 1.01 03/09/2018     BNP (last 3 results) Recent Labs    03/04/18 1158  BNP 4,449.1*    ProBNP (last 3 results) No results for input(s): PROBNP in the last 8760 hours.   Other Studies Reviewed Today:  Echocardiogram 03/06/2018 Study Conclusions   - Left ventricle: The cavity size was mildly dilated. Wall thickness was increased in a pattern of mild LVH. LV mid ventricle false tendon. The estimated ejection fraction was 15%. Diffuse hypokinesis. The study is not technically sufficient to allow evaluation of LV diastolic function. - Mitral valve: Mildly thickened leaflets . There was moderate to severe, eccentric  regurgitation. - Left atrium: Severely dilated. - Right ventricle: The cavity size was mildly dilated. Mild systolic dysfunction. - Right atrium: The atrium was mildly dilated. - Tricuspid valve: There was mild regurgitation. - Pulmonary arteries: PA peak pressure: 34 mm Hg (S). - Inferior vena cava: The vessel was normal in size. The respirophasic diameter changes were in the normal range (>= 50%), consistent with normal central venous pressure.  Impressions: - LVEF 15%, mildly dilated LV with mild LVH, severe global hypokinesis, dilated RV with mild hypokinesis, moderate to severe eccentric MR, severe LAE, mild RAE, mild TR, RVSP 34 mmHg, normal  IVC. _____________  RIGHT/LEFT HEART CATH AND CORONARY  03/09/2018  Conclusion   Conclusions: 1. No angiographically significant coronary artery disease, consistent with non-ischemic cardiomyopathy. 2. Normal left heart, right heart, and pulmonary artery pressures. 3. Mildly decreased cardiac output.  Recommendations: 1. Aggressive medical therapy; will add lisinopril 2.5 mg daily to enhance evidence-based HF therapy. 2. Outpatient follow-up in 1 week to reassess clinical status and obtain BMP.  Nelva Bush, MD     Assessment/Plan:  1. Acute on chronic systolic HF - EF of 15 to 20% - she is on very low dose therapy - she is doing quite well - I would now put her at NYHA I/II -  will stop the Lisinopril today - start Entresto 24-26 mg BID 36 hours after her last dose of Lisinopril. Lab today. BMET again in one week. Continue with salt restriction and daily weights. Hopefully her BP will support. Cutting Lasix back today to just 20 mg. Plan for repeat echo in 3 months.   2. CKD - rechecking labs today.   3. Tobacco abuse - she has Chantix - she has not started - ok to start.   4. Post cardiac cath - stable findings.   5. HTN - BP actually now soft. May not have enough to support target therapies. She knows to let us know  if she has any dizziness.   6. HLD - on statin therapy  7. Pre diabetes - defer to PCP  8. Elevated LFTs - needs rechecking -  will get today.   9. Moderate to severe MR - ? How much this is playing a role with current situation. ?worse due to acute CHF. Will need to follow going forward.   Current medicines are reviewed with the patient today.  The patient does not have concerns regarding medicines other than what has been noted above.  The following changes have been made:  See above.  Labs/ tests ordered today include:    Orders Placed This Encounter  Procedures  . Basic metabolic panel  . Basic metabolic panel  . Hepatic function panel  . Pro b natriuretic peptide (BNP)     Disposition:   FU with me in one month.    Patient is agreeable to this plan and will call if any problems develop in the interim.   SignedTruitt Merle, NP  03/25/2018 10:11 AM  [facility] [address] [place], [place]  [address] Phone: [phone] Fax: [phone]

## 2018-03-25 ENCOUNTER — Telehealth: Payer: Self-pay | Admitting: Nurse Practitioner

## 2018-03-25 ENCOUNTER — Ambulatory Visit (INDEPENDENT_AMBULATORY_CARE_PROVIDER_SITE_OTHER): Payer: Federal, State, Local not specified - PPO | Admitting: Nurse Practitioner

## 2018-03-25 ENCOUNTER — Encounter: Payer: Self-pay | Admitting: Nurse Practitioner

## 2018-03-25 ENCOUNTER — Telehealth: Payer: Self-pay

## 2018-03-25 VITALS — BP 118/76 | HR 90 | Ht 65.5 in | Wt 151.8 lb

## 2018-03-25 DIAGNOSIS — Z9889 Other specified postprocedural states: Secondary | ICD-10-CM

## 2018-03-25 DIAGNOSIS — Z720 Tobacco use: Secondary | ICD-10-CM

## 2018-03-25 DIAGNOSIS — I10 Essential (primary) hypertension: Secondary | ICD-10-CM

## 2018-03-25 DIAGNOSIS — I5022 Chronic systolic (congestive) heart failure: Secondary | ICD-10-CM

## 2018-03-25 DIAGNOSIS — E7849 Other hyperlipidemia: Secondary | ICD-10-CM | POA: Diagnosis not present

## 2018-03-25 LAB — BASIC METABOLIC PANEL
BUN/Creatinine Ratio: 22 (ref 9–23)
BUN: 20 mg/dL (ref 6–24)
CO2: 24 mmol/L (ref 20–29)
Calcium: 11 mg/dL — ABNORMAL HIGH (ref 8.7–10.2)
Chloride: 95 mmol/L — ABNORMAL LOW (ref 96–106)
Creatinine, Ser: 0.92 mg/dL (ref 0.57–1.00)
GFR calc Af Amer: 80 mL/min/{1.73_m2} (ref >59)
GFR calc non Af Amer: 70 mL/min/{1.73_m2} (ref >59)
Glucose: 84 mg/dL (ref 65–99)
Potassium: 5.1 mmol/L (ref 3.5–5.2)
Sodium: 139 mmol/L (ref 134–144)

## 2018-03-25 LAB — PRO B NATRIURETIC PEPTIDE: NT-Pro BNP: 2010 pg/mL — ABNORMAL HIGH (ref 0–287)

## 2018-03-25 LAB — HEPATIC FUNCTION PANEL
ALT: 27 IU/L (ref 0–32)
AST: 27 IU/L (ref 0–40)
Albumin: 4.8 g/dL (ref 3.5–5.5)
Alkaline Phosphatase: 93 IU/L (ref 39–117)
Bilirubin Total: 0.3 mg/dL (ref 0.0–1.2)
Bilirubin, Direct: 0.11 mg/dL (ref 0.00–0.40)
Total Protein: 7.4 g/dL (ref 6.0–8.5)

## 2018-03-25 MED ORDER — SACUBITRIL-VALSARTAN 24-26 MG PO TABS
1.0000 | ORAL_TABLET | Freq: Two times a day (BID) | ORAL | 6 refills | Status: DC
Start: 2018-03-25 — End: 2018-04-06

## 2018-03-25 MED ORDER — FUROSEMIDE 40 MG PO TABS: 20.0000 mg | ORAL_TABLET | Freq: Every day | ORAL | 5 refills | Status: DC

## 2018-03-25 NOTE — Telephone Encounter (Signed)
Patient returning call for lab results. 

## 2018-03-25 NOTE — Patient Instructions (Addendum)
We will be checking the following labs today - BMET, CBC, HPF and BNP  BMET in one week   Medication Instructions:    Continue with your current medicines. BUT  STOP Lisinopril  After 36 hours of stopping Lisinopril - you can start Entresto 24-26mg  twice a day - we will give you samples and start working on patient assistance  Ok to start the Chantix  Cut the Lasix to just 20 mg a day - half a pill only    Testing/Procedures To Be Arranged:  N/A  Follow-Up:   See me in about one month.   See Dr. Tamala Julian in about 2 months.     Other Special Instructions:   Keep up the good work with weighing and restricting your salt.     If you need a refill on your cardiac medications before your next appointment, please call your pharmacy.   Call the [place] office at [phone] if you have any questions, problems or concerns.

## 2018-03-25 NOTE — Telephone Encounter (Signed)
I have done an Entresto PA through covermymeds. 

## 2018-03-26 NOTE — Telephone Encounter (Signed)
Received fax from [facility] stating patients ENTRESTO has been approved until 12/08/2038. I called and spoke with patient.

## 2018-04-01 ENCOUNTER — Other Ambulatory Visit: Payer: Federal, State, Local not specified - PPO | Admitting: *Deleted

## 2018-04-01 DIAGNOSIS — I5022 Chronic systolic (congestive) heart failure: Secondary | ICD-10-CM | POA: Diagnosis not present

## 2018-04-01 LAB — BASIC METABOLIC PANEL
BUN/Creatinine Ratio: 19 (ref 9–23)
BUN: 20 mg/dL (ref 6–24)
CO2: 25 mmol/L (ref 20–29)
Calcium: 10.4 mg/dL — ABNORMAL HIGH (ref 8.7–10.2)
Chloride: 97 mmol/L (ref 96–106)
Creatinine, Ser: 1.03 mg/dL — ABNORMAL HIGH (ref 0.57–1.00)
GFR calc Af Amer: 70 mL/min/{1.73_m2} (ref >59)
GFR calc non Af Amer: 61 mL/min/{1.73_m2} (ref >59)
Glucose: 92 mg/dL (ref 65–99)
Potassium: 5.7 mmol/L — ABNORMAL HIGH (ref 3.5–5.2)
Sodium: 138 mmol/L (ref 134–144)

## 2018-04-02 ENCOUNTER — Other Ambulatory Visit: Payer: Self-pay | Admitting: *Deleted

## 2018-04-02 DIAGNOSIS — E875 Hyperkalemia: Secondary | ICD-10-CM

## 2018-04-02 NOTE — Progress Notes (Signed)
That should not be a problem to use.   We will recheck as planned.   Cecille Rubin

## 2018-04-06 ENCOUNTER — Other Ambulatory Visit: Payer: Self-pay | Admitting: *Deleted

## 2018-04-06 ENCOUNTER — Other Ambulatory Visit: Payer: Federal, State, Local not specified - PPO | Admitting: *Deleted

## 2018-04-06 ENCOUNTER — Telehealth: Payer: Self-pay | Admitting: Nurse Practitioner

## 2018-04-06 DIAGNOSIS — E875 Hyperkalemia: Secondary | ICD-10-CM

## 2018-04-06 NOTE — Telephone Encounter (Signed)
New Message    Santiago Glad with [facility] is calling to provide alert labs for patient. Please call to discuss.

## 2018-04-06 NOTE — Telephone Encounter (Signed)
This result is from lab results.  Pt is aware of lab results and agreeable to treatment plan. Will d/c entresto and come in on Thursday for repeat bmet.  Discussed in length potassium enriched foods.  Will avoid salt. Medication list updated, orders in and appt made and linked.

## 2018-04-07 ENCOUNTER — Telehealth: Payer: Self-pay | Admitting: *Deleted

## 2018-04-07 ENCOUNTER — Telehealth: Payer: Self-pay

## 2018-04-07 LAB — BASIC METABOLIC PANEL
BUN/Creatinine Ratio: 17 (ref 9–23)
BUN: 18 mg/dL (ref 6–24)
CO2: 25 mmol/L (ref 20–29)
Calcium: 10.8 mg/dL — ABNORMAL HIGH (ref 8.7–10.2)
Chloride: 99 mmol/L (ref 96–106)
Creatinine, Ser: 1.08 mg/dL — ABNORMAL HIGH (ref 0.57–1.00)
GFR calc Af Amer: 66 mL/min/{1.73_m2} (ref >59)
GFR calc non Af Amer: 58 mL/min/{1.73_m2} — ABNORMAL LOW (ref >59)
Glucose: 83 mg/dL (ref 65–99)
Potassium: 6.2 mmol/L (ref 3.5–5.2)
Sodium: 139 mmol/L (ref 134–144)

## 2018-04-07 NOTE — Telephone Encounter (Signed)
[facility] called earlier this morning to advise of critical lab value on K+ 6.9. This lab results has already been addressed and pt has been notified . I will forward to Truitt Merle, NP as Juluis Rainier.

## 2018-04-07 NOTE — Telephone Encounter (Signed)
The pt left her completed Entresto pt assistance application here at the office. On 4/29 the pt was advised per Jordan Merle, NP to stop taking Entresto due to elevated Potassium level of 6.2. BMET to be re-drawn on 04/09/18.  I have completed the provider part of the application and placed it in Dr [employer] mail bin awaiting his signature and I will fax to [facility] once it is ok for the pt to resume Entresto.

## 2018-04-08 ENCOUNTER — Encounter: Payer: Self-pay | Admitting: Nurse Practitioner

## 2018-04-09 ENCOUNTER — Encounter (INDEPENDENT_AMBULATORY_CARE_PROVIDER_SITE_OTHER): Payer: Self-pay

## 2018-04-09 ENCOUNTER — Other Ambulatory Visit: Payer: Federal, State, Local not specified - PPO | Admitting: *Deleted

## 2018-04-09 DIAGNOSIS — E875 Hyperkalemia: Secondary | ICD-10-CM

## 2018-04-09 LAB — BASIC METABOLIC PANEL
BUN/Creatinine Ratio: 22 (ref 9–23)
BUN: 22 mg/dL (ref 6–24)
CO2: 25 mmol/L (ref 20–29)
Calcium: 10.1 mg/dL (ref 8.7–10.2)
Chloride: 100 mmol/L (ref 96–106)
Creatinine, Ser: 1 mg/dL (ref 0.57–1.00)
GFR calc Af Amer: 73 mL/min/{1.73_m2} (ref >59)
GFR calc non Af Amer: 63 mL/min/{1.73_m2} (ref >59)
Glucose: 101 mg/dL — ABNORMAL HIGH (ref 65–99)
Potassium: 5 mmol/L (ref 3.5–5.2)
Sodium: 140 mmol/L (ref 134–144)

## 2018-04-10 NOTE — Telephone Encounter (Signed)
Dr Tamala Julian has signed the [facility] pt assistance application and I have faxed it to [facility].

## 2018-04-13 NOTE — Telephone Encounter (Signed)
**Note De-Identified Jordan [name] Obfuscation** Letter received [name] fax from [facility] stating that this pt was enrolled into the $10 co-pay assistance program. Her co-pay will be $10 until she reaches the $2,500 annual limit. The letter states that they have mailed the pt a co-pay card.  I have sent letter to be scanned into the pts chart.

## 2018-04-27 NOTE — Progress Notes (Signed)
CARDIOLOGY OFFICE NOTE  Date:  04/28/2018    [name] Date of Birth: 08/13/1961 Medical Record #[id number]  PCP:  Marda Stalker, PA-C  Cardiologist:  Jennings Books    Chief Complaint  Patient presents with  . Cardiomyopathy  . Congestive Heart Failure    1 month check - seen for Dr. Tamala Julian    History of Present Illness: Jordan [name] is a 57 y.o. female who presents today for a one month check. Seen for Dr. Tamala Julian.   She has a history ofremote mitral valve prolapse, HTN, pre-diabetes, hyponatremia 2014, arthritis, ongoing tobacco abuse, and family history of CAD.   She presented to the hospital back in March of 2019 with SOB.  She reports a very remote hx of MVP diagnosed decades ago, without need for follow-up until recently. Last November she developed shortness of breath and cough so was treated for a URI on multiple occasions. Subsequent CXR showed enlargement of her heart on imaging and she was referred for echo.  She then developed recurrent worsening of shortness of breath, this time associated with orthopnea, PND, and thick white sputum cough. She saw primary care again and got an RX for albuterol inhaler and nasal fluticasone again, but had no relief. FH is + for CAD. Her father had 5v CABG in his 56s but he was also diabetic. Due to the persistent dyspnea she presented to the ER. Found to have EF down to 20%. She was diuresed. No ACE initially due to CKD but was able to add later. R & L heart cath - this was basically normal.   When I saw her last month she was doing very well. Not short of breath. She was going to try Chantix to stop smoking. I changed her ACE to [facility] but then she had marked hyperkalemia and we stopped. We did change Lasix to prn. Plan to have follow up echo 3 months after initial study and after being on more targeted therapy.   Comes in today. Here alone. She continues to do very well. Says she feels the best she has in some  time. Not short of breath. Has had to only take a dose of Lasix just a couple of times. She has stopped smoking. She notes her appetite has picked up. No swelling. Good energy level. Very active. Not dizzy. She will have some intermittent spells of being nauseated - its fleeting. Overall, she feels like she is doing very well.   Past Medical History:  Diagnosis Date  . Arthritis   . Arthritis of knee, left 08/25/2013  . Cardiomegaly 03/02/2018  . Hypertension   . Hyponatremia 08/26/2013  . MVP (mitral valve prolapse)   . Pre-diabetes   . Tobacco abuse     Past Surgical History:  Procedure Laterality Date  . ABDOMINAL HYSTERECTOMY  94  . APPENDECTOMY    . CERVICAL DISCECTOMY  08  . KNEE ARTHROSCOPY Right 09,and ?  . RIGHT/LEFT HEART CATH AND CORONARY ANGIOGRAPHY N/A 03/09/2018   Procedure: RIGHT/LEFT HEART CATH AND CORONARY ANGIOGRAPHY;  Surgeon: Nelva Bush, MD;  Location: [place];  Service: Cardiovascular;  Laterality: N/A;  . SHOULDER ARTHROSCOPY Left    bone spur  . TOTAL KNEE ARTHROPLASTY Right 08/25/2013   Procedure: TOTAL KNEE ARTHROPLASTY;  Surgeon: Kerin Salen, MD;  Location: [place];  Service: Orthopedics;  Laterality: Right;     Medications: Current Meds  Medication Sig  . albuterol (PROVENTIL HFA;VENTOLIN HFA) 108 (  90 Base) MCG/ACT inhaler Inhale 2 puffs into the lungs every 6 (six) hours as needed for wheezing or shortness of breath.  Marland Kitchen aspirin EC 81 MG tablet Take 81 mg by mouth daily.  Marland Kitchen CALCIUM PO Take 1 tablet by mouth daily.  . carvedilol (COREG) 3.125 MG tablet Take 1 tablet (3.125 mg total) by mouth 2 (two) times daily with a meal.  . CHANTIX CONTINUING MONTH PAK 1 MG tablet Take 1 mg by mouth daily.   Marland Kitchen FLOVENT HFA 110 MCG/ACT inhaler Inhale 1 puff into the lungs 2 (two) times daily.  . furosemide (LASIX) 40 MG tablet Take 0.5 tablets (20 mg total) by mouth daily.  Marland Kitchen ipratropium (ATROVENT) 0.06 % nasal spray Place 2 sprays into both nostrils 2  (two) times daily.  Marland Kitchen lactobacillus acidophilus (BACID) TABS tablet Take 1 tablet by mouth as needed (digestion aid).  . Multiple Vitamins-Minerals (MULTIVITAMIN WITH MINERALS) tablet Take 1 tablet by mouth daily.  . pravastatin (PRAVACHOL) 40 MG tablet Take 40 mg by mouth daily.  . Venlafaxine HCl 225 MG TB24 Take 225 mg by mouth daily.   . [DISCONTINUED] varenicline (CHANTIX) 0.5 MG tablet Take 0.5 mg by mouth daily.     Allergies: Allergies  Allergen Reactions  . Entresto [Sacubitril-Valsartan]     Marked hyperkalemia  . Guaifenesin & Derivatives Hives  . Naprosyn [Naproxen] Hives    Social History: The patient  reports that she quit smoking 7 days ago. Her smoking use included cigarettes. She has a 20.00 pack-year smoking history. She has never used smokeless tobacco. She reports that she drinks about 0.5 oz of alcohol per week. She reports that she does not use drugs.   Family History: The patient's family history includes CAD in her father; Diabetes in her brother and father; Hypertension in her father; Liver disease in her sister.   Review of Systems: Please see the history of present illness.   Otherwise, the review of systems is positive for none.   All other systems are reviewed and negative.   Physical Exam: VS:  BP (!) 142/80 (BP Location: Left Arm, Patient Position: Sitting, Cuff Size: Normal)   Pulse 86   Ht 5' 5.5" (1.664 m)   Wt 156 lb 1.9 oz (70.8 kg)   SpO2 97%   BMI 25.58 kg/m  .  BMI Body mass index is 25.58 kg/m.  Wt Readings from Last 3 Encounters:  04/28/18 156 lb 1.9 oz (70.8 kg)  03/25/18 151 lb 12.8 oz (68.9 kg)  03/09/18 148 lb (67.1 kg)    General: Pleasant. She looks good and in no acute distress.   HEENT: Normal.  Neck: Supple, no JVD, carotid bruits, or masses noted.  Cardiac: Regular rate and rhythm. Not much murmur that I can appreciate. No edema.  Respiratory:  Lungs are clear to auscultation bilaterally with normal work of breathing.    GI: Soft and nontender.  MS: No deformity or atrophy. Gait and ROM intact.  Skin: Warm and dry. Color is normal.  Neuro:  Strength and sensation are intact and no gross focal deficits noted.  Psych: Alert, appropriate and with normal affect.   LABORATORY DATA:  EKG:  EKG is not ordered today.  Lab Results  Component Value Date   WBC 6.9 03/04/2018   HGB 13.9 03/04/2018   HCT 42.5 03/04/2018   PLT 246 03/04/2018   GLUCOSE 101 (H) 04/09/2018   CHOL 130 03/05/2018   TRIG 96 03/05/2018   HDL 45 03/05/2018  LDLCALC 66 03/05/2018   ALT 27 03/25/2018   AST 27 03/25/2018   NA 140 04/09/2018   K 5.0 04/09/2018   CL 100 04/09/2018   CREATININE 1.00 04/09/2018   BUN 22 04/09/2018   CO2 25 04/09/2018   TSH 2.436 03/04/2018   INR 1.01 03/09/2018     BNP (last 3 results) Recent Labs    03/04/18 1158  BNP 4,449.1*    ProBNP (last 3 results) Recent Labs    03/25/18 1015  PROBNP 2,010*     Other Studies Reviewed Today:  Echocardiogram 03/06/2018 Study Conclusions   - Left ventricle: The cavity size was mildly dilated. Wall thickness was increased in a pattern of mild LVH. LV mid ventricle false tendon. The estimated ejection fraction was 15%. Diffuse hypokinesis. The study is not technically sufficient to allow evaluation of LV diastolic function. - Mitral valve: Mildly thickened leaflets . There was moderate to severe, eccentric regurgitation. - Left atrium: Severely dilated. - Right ventricle: The cavity size was mildly dilated. Mild systolic dysfunction. - Right atrium: The atrium was mildly dilated. - Tricuspid valve: There was mild regurgitation. - Pulmonary arteries: PA peak pressure: 34 mm Hg (S). - Inferior vena cava: The vessel was normal in size. The respirophasic diameter changes were in the normal range (>= 50%), consistent with normal central venous pressure.  Impressions: - LVEF 15%, mildly dilated LV with mild LVH, severe  global hypokinesis, dilated RV with mild hypokinesis, moderate to severe eccentric MR, severe LAE, mild RAE, mild TR, RVSP 34 mmHg, normal IVC. _____________  RIGHT/LEFT HEART CATH AND CORONARY  03/09/2018  Conclusion   Conclusions: 1. No angiographically significant coronary artery disease, consistent with non-ischemic cardiomyopathy. 2. Normal left heart, right heart, and pulmonary artery pressures. 3. Mildly decreased cardiac output.  Recommendations: 1. Aggressive medical therapy; will add lisinopril 2.5 mg daily to enhance evidence-based HF therapy. 2. Outpatient follow-up in 1 week to reassess clinical status and obtain BMP.  Nelva Bush, MD     Assessment/Plan:  1. Acute on chronic systolic HF - EF of 15 to 20% initially - remains NYHA I - on very little therapy. She is doing remarkably well. Had marked hyperkalemia with Entresto and did have AKI initially. BP higher today - will try low dose Imdur and Hydralazine. She is going to try and get a BP cuff to monitor. Will plan for repeat echo after June 29th. BMET today.   2. CKD - rechecking labs again today.   3. Tobacco abuse - she has been able to stop with Chantix.   4. Prior cardiac cath - stable findings.   5. HTN - BP little higher - trying to add further agents today.   6. HLD - on statin therapy  7. Pre diabetes - defer to PCP  8. Elevated LFTs - normal on recheck.   9. Moderate to severe MR - ? How much this is playing a role with current situation. ?worse due to acute CHF. Will need to follow going forward. Repeat echo planned.   Current medicines are reviewed with the patient today.  The patient does not have concerns regarding medicines other than what has been noted above.  The following changes have been made:  See above.  Labs/ tests ordered today include:    Orders Placed This Encounter  Procedures  . Basic metabolic panel  . ECHOCARDIOGRAM COMPLETE     Disposition:   FU  with Dr. Tamala Julian as planned.    Patient is  agreeable to this plan and will call if any problems develop in the interim.   SignedTruitt Merle, NP  04/28/2018 8:52 AM  [facility] [address] [place], [place]  [address] Phone: [phone] Fax: [phone]

## 2018-04-28 ENCOUNTER — Ambulatory Visit: Payer: Federal, State, Local not specified - PPO | Admitting: Nurse Practitioner

## 2018-04-28 ENCOUNTER — Encounter: Payer: Self-pay | Admitting: Nurse Practitioner

## 2018-04-28 VITALS — BP 142/80 | HR 86 | Ht 65.5 in | Wt 156.1 lb

## 2018-04-28 DIAGNOSIS — I5022 Chronic systolic (congestive) heart failure: Secondary | ICD-10-CM

## 2018-04-28 LAB — BASIC METABOLIC PANEL
BUN/Creatinine Ratio: 20 (ref 9–23)
BUN: 19 mg/dL (ref 6–24)
CO2: 24 mmol/L (ref 20–29)
Calcium: 10.7 mg/dL — ABNORMAL HIGH (ref 8.7–10.2)
Chloride: 99 mmol/L (ref 96–106)
Creatinine, Ser: 0.94 mg/dL (ref 0.57–1.00)
GFR calc Af Amer: 78 mL/min/{1.73_m2} (ref >59)
GFR calc non Af Amer: 68 mL/min/{1.73_m2} (ref >59)
Glucose: 93 mg/dL (ref 65–99)
Potassium: 5.1 mmol/L (ref 3.5–5.2)
Sodium: 139 mmol/L (ref 134–144)

## 2018-04-28 MED ORDER — HYDRALAZINE HCL 10 MG PO TABS: 10.0000 mg | ORAL_TABLET | Freq: Three times a day (TID) | ORAL | 3 refills | Status: DC

## 2018-04-28 MED ORDER — ISOSORBIDE MONONITRATE ER 30 MG PO TB24: 15.0000 mg | ORAL_TABLET | Freq: Every day | ORAL | 3 refills | Status: DC

## 2018-04-28 NOTE — Progress Notes (Signed)
We need to see if we can have some form of Angiotensin/Renin blockade. Recommend adding low dose Aldactone, watching potassium, May have to decrease or DC Hydralazine/Imdur or Lasix. Try to add on after she gets stable on Imdur/hydralazione

## 2018-04-28 NOTE — Patient Instructions (Addendum)
We will be checking the following labs today - BMET   Medication Instructions:    Continue with your current medicines. BUT  We will not be restarting Entresto or Lisinopril  I am starting Imdur 30 mg  - taking just 1/2 a tablet each day  I am starting Hydralazine 10 mg to take 3 times a day.     Testing/Procedures To Be Arranged:  Echocardiogram after June 28th.   Follow-Up:   See Dr. Tamala Julian as planned in July.     Other Special Instructions:   Try to monitor your BP for me if possible.   Congrats for not smoking!    If you need a refill on your cardiac medications before your next appointment, please call your pharmacy.   Call the [place] office at [phone] if you have any questions, problems or concerns.

## 2018-05-06 ENCOUNTER — Telehealth: Payer: Self-pay | Admitting: Interventional Cardiology

## 2018-05-06 NOTE — Telephone Encounter (Signed)
New Message       [facility] Pre-operative Risk Assessment    Request for surgical clearance:  1. What type of surgery is being performed? Deep Clean and Crown   2. When is this surgery scheduled? TBD   3. What type of clearance is required (medical clearance vs. Pharmacy clearance to hold med vs. Both)? Medical   4. Are there any medications that need to be held prior to surgery and how long? none  5. Practice name and name of physician performing surgery? [facility] Dr. Elveria Rising   6. What is your office phone number [phone]    7.   What is your office fax number [phone]   8.   Anesthesia type (None, local, MAC, general) ? local   Avaletta L Williams 05/06/2018, 11:15 AM  _________________________________________________________________   (provider comments below)

## 2018-05-06 NOTE — Telephone Encounter (Signed)
   Primary Cardiologist: Sinclair Grooms, MD  Chart reviewed as part of pre-operative protocol coverage.   Patient with severe cardiomyopathy, moderate-severe MR, MVP, HTN, tobacco, hyponatremia, no CAD by cath 03/2018. 2D echo is planned for 06/08/18. In the setting of her valvular disease I just want to touch base with Dr. Tamala Julian to make sure he feels she is OK for a deep dental clean and crown as listed below. Dr. Tamala Julian please route reply back to P CV DIV PREOP. Thanks.  Charlie Pitter, PA-C 05/06/2018, 3:44 PM

## 2018-05-07 NOTE — Telephone Encounter (Signed)
Okay to proceed but will need appropriate endocarditis prophy.

## 2018-05-08 NOTE — Telephone Encounter (Signed)
Called pt to verify if pt has appropriate prophylactic medication. Left message to call back.

## 2018-05-08 NOTE — Telephone Encounter (Signed)
I am not in pre-op clinic today, but got this message back to my inbox - Dr. Tamala Julian, what kind of endocarditis prophylaxis are you referring to? Pt does not have any history of valve replacement or PFO closure. Please reply to P CV DIV PREOP so the preop folks can track this. Thanks [employer] PA-C

## 2018-05-11 MED ORDER — AMOXICILLIN 500 MG PO TABS: ORAL_TABLET | ORAL | 0 refills | Status: DC

## 2018-05-11 NOTE — Telephone Encounter (Signed)
Left patient a message informing her to contact office.  We need to determine if she needs [place] to send in the Ampicillin or if she has already received it from her dentist. Waiting for patient to return call.

## 2018-05-11 NOTE — Telephone Encounter (Signed)
Spoke with Jordan [name] and informed her that Dr Tamala Julian recommends that she take the amoxicillin 30-60 minutes before her procedure. Patient voiced understanding and requested rx be sent to [facility] on [address].

## 2018-05-11 NOTE — Telephone Encounter (Signed)
Though she has no mechanical device or previous endocarditis, echo states that she has severe mitral regurgitation.  My thought about this is that it is similar/equivalent to having previous endocarditis and would prophylax with 2 g of ampicillin 30 to 60 minutes prior to deep cleaning.

## 2018-05-11 NOTE — Telephone Encounter (Signed)
Per Lurena Joiner ok for patient to take Amoxcillin vs ampicillin

## 2018-05-11 NOTE — Progress Notes (Signed)
Discussed today with Dr. Tamala Julian - will hold on adding Aldactone due to tendency towards hyperkalemia - current potassium is 5.1.   Seeing Dr. Tamala Julian as planned in July.  Burtis Junes, RN, [place] [address] [place], [place]  [id number] [phone]

## 2018-05-11 NOTE — Telephone Encounter (Signed)
   Primary Cardiologist: Sinclair Grooms, MD  Chart reviewed as part of pre-operative protocol coverage. Given past medical history and time since last visit, based on ACC/AHA guidelines, Jordan [name] would be at acceptable risk for the planned procedure without further cardiovascular testing.   Dr Tamala Julian does recommend pre operative antibiotics for SBE prophylaxis-2 gm of Ampicillin po 30-60 minutes before deep dental cleaning.   I will route this recommendation to the requesting party via Epic fax function and remove from pre-op pool.  Please call with questions.  Kerin Ransom, PA-C 05/11/2018, 1:46 PM

## 2018-05-11 NOTE — Addendum Note (Signed)
Addended by: Ulice Brilliant T on: 05/11/2018 02:39 PM   Modules accepted: Orders

## 2018-06-08 ENCOUNTER — Ambulatory Visit (HOSPITAL_COMMUNITY): Payer: Federal, State, Local not specified - PPO | Attending: Cardiovascular Disease

## 2018-06-08 ENCOUNTER — Other Ambulatory Visit: Payer: Self-pay

## 2018-06-08 DIAGNOSIS — Z8249 Family history of ischemic heart disease and other diseases of the circulatory system: Secondary | ICD-10-CM | POA: Insufficient documentation

## 2018-06-08 DIAGNOSIS — R7303 Prediabetes: Secondary | ICD-10-CM | POA: Insufficient documentation

## 2018-06-08 DIAGNOSIS — Z720 Tobacco use: Secondary | ICD-10-CM | POA: Insufficient documentation

## 2018-06-08 DIAGNOSIS — I110 Hypertensive heart disease with heart failure: Secondary | ICD-10-CM | POA: Diagnosis not present

## 2018-06-08 DIAGNOSIS — I5022 Chronic systolic (congestive) heart failure: Secondary | ICD-10-CM | POA: Insufficient documentation

## 2018-06-16 NOTE — Progress Notes (Deleted)
Cardiology Office Note    Date:  06/16/2018   ID:  IRIDIAN READER, DOB 02-28-1961, MRN [id number]  PCP:  Marda Stalker, PA-C  Cardiologist: Sinclair Grooms, MD   No chief complaint on file.   History of Present Illness:  Jordan [name] is a 57 y.o. female ahistory ofremote mitral valve prolapse, HTN, pre-diabetes, hyponatremia 2014, arthritis, ongoing tobacco abuse, and family history of CAD. A/C systolic HF with EF < 62-95%. 02/2018.     Past Medical History:  Diagnosis Date  . Arthritis   . Arthritis of knee, left 08/25/2013  . Cardiomegaly 03/02/2018  . Hypertension   . Hyponatremia 08/26/2013  . MVP (mitral valve prolapse)   . Pre-diabetes   . Tobacco abuse     Past Surgical History:  Procedure Laterality Date  . ABDOMINAL HYSTERECTOMY  94  . APPENDECTOMY    . CERVICAL DISCECTOMY  08  . KNEE ARTHROSCOPY Right 09,and ?  . RIGHT/LEFT HEART CATH AND CORONARY ANGIOGRAPHY N/A 03/09/2018   Procedure: RIGHT/LEFT HEART CATH AND CORONARY ANGIOGRAPHY;  Surgeon: Nelva Bush, MD;  Location: [facility];  Service: Cardiovascular;  Laterality: N/A;  . SHOULDER ARTHROSCOPY Left    bone spur  . TOTAL KNEE ARTHROPLASTY Right 08/25/2013   Procedure: TOTAL KNEE ARTHROPLASTY;  Surgeon: Kerin Salen, MD;  Location: [facility];  Service: Orthopedics;  Laterality: Right;    Current Medications: Outpatient Medications Prior to Visit  Medication Sig Dispense Refill  . albuterol (PROVENTIL HFA;VENTOLIN HFA) 108 (90 Base) MCG/ACT inhaler Inhale 2 puffs into the lungs every 6 (six) hours as needed for wheezing or shortness of breath.  2  . amoxicillin (AMOXIL) 500 MG tablet Take 4 tablets 30-60 minutes before procedure 4 tablet 0  . aspirin EC 81 MG tablet Take 81 mg by mouth daily.    Marland Kitchen CALCIUM PO Take 1 tablet by mouth daily.    . carvedilol (COREG) 3.125 MG tablet Take 1 tablet (3.125 mg total) by mouth 2 (two) times daily with a meal. 60 tablet 5  . CHANTIX CONTINUING  MONTH PAK 1 MG tablet Take 1 mg by mouth daily.     Marland Kitchen FLOVENT HFA 110 MCG/ACT inhaler Inhale 1 puff into the lungs 2 (two) times daily.  1  . furosemide (LASIX) 40 MG tablet Take 0.5 tablets (20 mg total) by mouth daily. 30 tablet 5  . hydrALAZINE (APRESOLINE) 10 MG tablet Take 1 tablet (10 mg total) by mouth 3 (three) times daily. 90 tablet 3  . ipratropium (ATROVENT) 0.06 % nasal spray Place 2 sprays into both nostrils 2 (two) times daily.  0  . isosorbide mononitrate (IMDUR) 30 MG 24 hr tablet Take 0.5 tablets (15 mg total) by mouth daily. 45 tablet 3  . lactobacillus acidophilus (BACID) TABS tablet Take 1 tablet by mouth as needed (digestion aid).    . Multiple Vitamins-Minerals (MULTIVITAMIN WITH MINERALS) tablet Take 1 tablet by mouth daily.    . pravastatin (PRAVACHOL) 40 MG tablet Take 40 mg by mouth daily.    . Venlafaxine HCl 225 MG TB24 Take 225 mg by mouth daily.      No facility-administered medications prior to visit.      Allergies:   Entresto [sacubitril-valsartan]; Guaifenesin & derivatives; and Naprosyn [naproxen]   Social History   Socioeconomic History  . Marital status: Married    Spouse name: Not on file  . Number of children: Not on file  . Years of education: Not  on file  . Highest education level: Not on file  Occupational History  . Not on file  Social Needs  . Financial resource strain: Not on file  . Food insecurity:    Worry: Not on file    Inability: Not on file  . Transportation needs:    Medical: Not on file    Non-medical: Not on file  Tobacco Use  . Smoking status: Former Smoker    Packs/day: 1.00    Years: 20.00    Pack years: 20.00    Types: Cigarettes    Last attempt to quit: 04/21/2018    Years since quitting: 0.1  . Smokeless tobacco: Never Used  . Tobacco comment: Has smoked for over 20 years  Substance and Sexual Activity  . Alcohol use: Yes    Alcohol/week: 0.6 oz    Types: 1 Standard drinks or equivalent per week    Comment:  Rare - 1 drink every 2-3 months  . Drug use: No  . Sexual activity: Not on file  Lifestyle  . Physical activity:    Days per week: Not on file    Minutes per session: Not on file  . Stress: Not on file  Relationships  . Social connections:    Talks on phone: Not on file    Gets together: Not on file    Attends religious service: Not on file    Active member of club or organization: Not on file    Attends meetings of clubs or organizations: Not on file    Relationship status: Not on file  Other Topics Concern  . Not on file  Social History Narrative  . Not on file     Family History:  The patient's ***family history includes CAD in her father; Diabetes in her brother and father; Hypertension in her father; Liver disease in her sister.   ROS:   Please see the history of present illness.    ***  All other systems reviewed and are negative.   PHYSICAL EXAM:   VS:  There were no vitals taken for this visit.   GEN: Well nourished, well developed, in no acute distress  HEENT: normal  Neck: no JVD, carotid bruits, or masses Cardiac: ***RRR; no murmurs, rubs, or gallops,no edema  Respiratory:  clear to auscultation bilaterally, normal work of breathing GI: soft, nontender, nondistended, + BS MS: no deformity or atrophy  Skin: warm and dry, no rash Neuro:  Alert and Oriented x 3, Strength and sensation are intact Psych: euthymic mood, full affect  Wt Readings from Last 3 Encounters:  04/28/18 156 lb 1.9 oz (70.8 kg)  03/25/18 151 lb 12.8 oz (68.9 kg)  03/09/18 148 lb (67.1 kg)      Studies/Labs Reviewed:   EKG:  EKG  ***  Recent Labs: 03/04/2018: B Natriuretic Peptide 4,449.1; Hemoglobin 13.9; Platelets 246; TSH 2.436 03/25/2018: ALT 27; NT-Pro BNP 2,010 04/28/2018: BUN 19; Creatinine, Ser 0.94; Potassium 5.1; Sodium 139   Lipid Panel    Component Value Date/Time   CHOL 130 03/05/2018 0503   TRIG 96 03/05/2018 0503   HDL 45 03/05/2018 0503   CHOLHDL 2.9 03/05/2018  0503   VLDL 19 03/05/2018 0503   LDLCALC 66 03/05/2018 0503    Additional studies/ records that were reviewed today include:  ***    ASSESSMENT:    1. Acute systolic heart failure ([place])   2. Essential hypertension   3. MVP (mitral valve prolapse)   4. Chronic systolic heart  failure ([place])      PLAN:  In order of problems listed above:  1. ***    Medication Adjustments/Labs and Tests Ordered: Current medicines are reviewed at length with the patient today.  Concerns regarding medicines are outlined above.  Medication changes, Labs and Tests ordered today are listed in the Patient Instructions below. There are no Patient Instructions on file for this visit.   Signed, Sinclair Grooms, MD  06/16/2018 9:49 PM    [facility] [place], [place], [place]  [address] Phone: [phone]; Fax: [phone]

## 2018-06-17 ENCOUNTER — Ambulatory Visit: Payer: Federal, State, Local not specified - PPO | Admitting: Interventional Cardiology

## 2018-07-26 ENCOUNTER — Other Ambulatory Visit: Payer: Self-pay | Admitting: Nurse Practitioner
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
## 2018-07-31 ENCOUNTER — Other Ambulatory Visit: Payer: Self-pay

## 2018-07-31 ENCOUNTER — Ambulatory Visit
Admission: RE | Admit: 2018-07-31 | Discharge: 2018-07-31 | Disposition: A | Discharge status: Home / Self Care | Payer: Federal, State, Local not specified - PPO | Source: Ambulatory Visit | Attending: Cardiology | Admitting: Cardiology

## 2018-07-31 ENCOUNTER — Encounter: Payer: Self-pay | Admitting: Cardiology

## 2018-07-31 ENCOUNTER — Ambulatory Visit (INDEPENDENT_AMBULATORY_CARE_PROVIDER_SITE_OTHER): Payer: Federal, State, Local not specified - PPO | Admitting: Cardiology

## 2018-07-31 VITALS — BP 146/82 | HR 95 | Ht 65.0 in | Wt 171.8 lb

## 2018-07-31 DIAGNOSIS — I5022 Chronic systolic (congestive) heart failure: Secondary | ICD-10-CM | POA: Diagnosis not present

## 2018-07-31 DIAGNOSIS — I428 Other cardiomyopathies: Secondary | ICD-10-CM | POA: Diagnosis not present

## 2018-07-31 DIAGNOSIS — R0789 Other chest pain: Secondary | ICD-10-CM

## 2018-07-31 DIAGNOSIS — Z720 Tobacco use: Secondary | ICD-10-CM

## 2018-07-31 DIAGNOSIS — R0602 Shortness of breath: Secondary | ICD-10-CM

## 2018-07-31 DIAGNOSIS — E7849 Other hyperlipidemia: Secondary | ICD-10-CM

## 2018-07-31 LAB — BASIC METABOLIC PANEL
BUN/Creatinine Ratio: 19 (ref 9–23)
BUN: 19 mg/dL (ref 6–24)
CO2: 29 mmol/L (ref 20–29)
Calcium: 11.2 mg/dL — ABNORMAL HIGH (ref 8.7–10.2)
Chloride: 104 mmol/L (ref 96–106)
Creatinine, Ser: 1.01 mg/dL — ABNORMAL HIGH (ref 0.57–1.00)
GFR calc Af Amer: 71 mL/min/{1.73_m2} (ref >59)
GFR calc non Af Amer: 62 mL/min/{1.73_m2} (ref >59)
Glucose: 97 mg/dL (ref 65–99)
Potassium: 4.9 mmol/L (ref 3.5–5.2)
Sodium: 143 mmol/L (ref 134–144)

## 2018-07-31 LAB — CBC
Hematocrit: 40.7 % (ref 34.0–46.6)
Hemoglobin: 13.7 g/dL (ref 11.1–15.9)
MCH: 31.9 pg (ref 26.6–33.0)
MCHC: 33.7 g/dL (ref 31.5–35.7)
MCV: 95 fL (ref 79–97)
Platelets: 232 10*3/uL (ref 150–450)
RBC: 4.29 x10E6/uL (ref 3.77–5.28)
RDW: 12 % — ABNORMAL LOW (ref 12.3–15.4)
WBC: 6 10*3/uL (ref 3.4–10.8)

## 2018-07-31 LAB — PRO B NATRIURETIC PEPTIDE: NT-Pro BNP: 746 pg/mL — ABNORMAL HIGH (ref 0–287)

## 2018-07-31 MED ORDER — FUROSEMIDE 40 MG PO TABS: 40.0000 mg | ORAL_TABLET | Freq: Every day | ORAL | 3 refills | Status: DC

## 2018-07-31 MED ORDER — CARVEDILOL 6.25 MG PO TABS: 6.2500 mg | ORAL_TABLET | Freq: Two times a day (BID) | ORAL | 3 refills | Status: DC

## 2018-07-31 MED ORDER — ISOSORBIDE MONONITRATE ER 30 MG PO TB24: 30.0000 mg | ORAL_TABLET | Freq: Every day | ORAL | 3 refills | Status: DC

## 2018-07-31 NOTE — Patient Instructions (Signed)
Medication Instructions:  1. INCREASE IMDUR TO 30 MG DAILY  2. INCREASE COREG TO 6.25 TWICE DAILY STARTING Monday 08/03/18; UNTIL CONTINUE ON THE COREG 3.125 MG TWICE DAILY  3. CONTINUE LASIX 40 MG DAILY  Labwork: 1. TODAY BMET, CBC, PR BNP  Testing/Procedures: 1. CHEST X-RAY TO BE DONE TODAY AT [facility] [place]   Follow-Up: 1. YOU ARE BEING REFERRED TO ELECTROPHYSIOLOGY :DISCUSS POSSIBLE ICD ; THIS APPT CAN BE WITH EITHER [facility] OR [facility] WHO HAS 1ST OPENING   2. FOLLOW UP WITH Truitt Merle, NP OR DR. Tamala Julian PER Cecilie Kicks, NP  Any Other Special Instructions Will Be Listed Below (If Applicable).     If you need a refill on your cardiac medications before your next appointment, please call your pharmacy.

## 2018-08-07 ENCOUNTER — Encounter: Payer: Self-pay | Admitting: Internal Medicine

## 2018-08-07 ENCOUNTER — Ambulatory Visit: Payer: Federal, State, Local not specified - PPO | Admitting: Internal Medicine

## 2018-08-07 VITALS — BP 130/70 | HR 85 | Ht 66.0 in | Wt 171.4 lb

## 2018-08-07 DIAGNOSIS — I5022 Chronic systolic (congestive) heart failure: Secondary | ICD-10-CM | POA: Diagnosis not present

## 2018-08-07 DIAGNOSIS — R002 Palpitations: Secondary | ICD-10-CM

## 2018-08-07 DIAGNOSIS — I428 Other cardiomyopathies: Secondary | ICD-10-CM | POA: Diagnosis not present

## 2018-08-07 MED ORDER — HYDRALAZINE HCL 25 MG PO TABS: 25.0000 mg | ORAL_TABLET | Freq: Three times a day (TID) | ORAL | 3 refills | Status: DC

## 2018-08-07 MED ORDER — ISOSORBIDE MONONITRATE ER 60 MG PO TB24: 60.0000 mg | ORAL_TABLET | Freq: Every day | ORAL | 3 refills | Status: DC

## 2018-08-07 NOTE — Patient Instructions (Addendum)
Medication Instructions:  Your physician has recommended you make the following change in your medication:  1, INCREASE Hydralazine to 25 mg three times daily 2. INCREASE Imdur to 60 mg daily  * If you need a refill on your cardiac medications before your next appointment, please call your pharmacy.   Labwork: None ordered  Testing/Procedures: Your physician has recommended that you wear a 48 hour holter monitor. Holter monitors are medical devices that record the heart's electrical activity. Doctors most often use these monitors to diagnose arrhythmias. Arrhythmias are problems with the speed or rhythm of the heartbeat. The monitor is a small, portable device. You can wear one while you do your normal daily activities. This is usually used to diagnose what is causing palpitations/syncope (passing out).  Follow-Up: You have been referred to the Heart Failure Clinic.   Thank you for choosing [facility]!!    Any Other Special Instructions Will Be Listed Below (If Applicable).   Cardiac Event Monitoring A cardiac event monitor is a small recording device that is used to detect abnormal heart rhythms (arrhythmias). The monitor is used to record your heart rhythm when you have symptoms, such as:  Fast heartbeats (palpitations), such as heart racing or fluttering.  Dizziness.  Fainting or light-headedness.  Unexplained weakness.  Some monitors are wired to electrodes placed on your chest. Electrodes are flat, sticky disks that attach to your skin. Other monitors may be hand-held or worn on the wrist. The monitor can be worn for up to 30 days. If the monitor is attached to your chest, a technician will prepare your chest for the electrode placement and show you how to work the monitor. Take time to practice using the monitor before you leave the office. Make sure you understand how to send the information from the monitor to your health care provider. In some cases, you may need to  use a landline telephone instead of a cell phone. What are the risks? Generally, this device is safe to use, but it possible that the skin under the electrodes will become irritated. How to use your cardiac event monitor  Wear your monitor at all times, except when you are in water: ? Do not let the monitor get wet. ? Take the monitor off when you bathe. Do not swim or use a hot tub with it on.  Keep your skin clean. Do not put body lotion or moisturizer on your chest.  Change the electrodes as told by your health care provider or any time they stop sticking to your skin. You may need to use medical tape to keep them on.  Try to put the electrodes in slightly different places on your chest to help prevent skin irritation. They must remain in the area under your left breast and in the upper right section of your chest.  Make sure the monitor is safely clipped to your clothing or in a location close to your body that your health care provider recommends.  Press the button to record as soon as you feel heart-related symptoms, such as: ? Dizziness. ? Weakness. ? Light-headedness. ? Palpitations. ? Thumping or pounding in your chest. ? Shortness of breath. ? Unexplained weakness.  Keep a diary of your activities, such as walking, doing chores, and taking medicine. It is very important to note what you were doing when you pushed the button to record your symptoms. This will help your health care provider determine what might be contributing to your symptoms.  Send the  recorded information as recommended by your health care provider. It may take some time for your health care provider to process the results.  Change the batteries as told by your health care provider.  Keep electronic devices away from your monitor. This includes: ? Tablets. ? MP3 players. ? Cell phones.  While wearing your monitor you should avoid: ? Electric blankets. ? Armed forces operational officer. ? Electric  toothbrushes. ? Microwave ovens. ? Magnets. ? Metal detectors. Get help right away if:  You have chest pain.  You have extreme difficulty breathing or shortness of breath.  You develop a very fast heartbeat that persists.  You develop dizziness that does not go away.  You faint or constantly feel like you are about to faint. Summary  A cardiac event monitor is a small recording device that is used to help detect abnormal heart rhythms (arrhythmias).  The monitor is used to record your heart rhythm when you have heart-related symptoms.  Make sure you understand how to send the information from the monitor to your health care provider.  It is important to press the button on the monitor when you have any heart-related symptoms.  Keep a diary of your activities, such as walking, doing chores, and taking medicine. It is very important to note what you were doing when you pushed the button to record your symptoms. This will help your health care provider learn what might be causing your symptoms. This information is not intended to replace advice given to you by your health care provider. Make sure you discuss any questions you have with your health care provider. Document Released: 09/03/2008 Document Revised: 11/09/2016 Document Reviewed: 11/09/2016 Elsevier Interactive Patient Education  2017 [employer].

## 2018-08-07 NOTE — Progress Notes (Signed)
ELECTROPHYSIOLOGY CONSULT NOTE  Patient ID: Jordan [name], MRN: [id number], DOB/AGE: Aug 28, 1961 57 y.o. Admit date: (Not on file) Date of Consult: 08/07/2018  Primary Physician: Marda Stalker, PA-C Primary Cardiologist: [facility]     Jordan [name] is a 57 y.o. female who is being seen today for the evaluation of ICD  at the request of [facility] .    HPI Jordan [name] is a 57 y.o. female  For consideration of ICD.  She has a long-standing history of mitral valve prolapse and presented to hospital 3/19 with congestive heart failure.  She had 6 months or so of antecedent shortness of breath and cough treated as bronchitis.  Chest x-ray demonstrated cardiac enlargement and echocardiogram demonstrated an ejection fraction of 20%.  Catheterization demonstrated no obstructive coronary disease  DATE TEST EF   3/19 Echo   20 %   7/19 Echo   25*-30 %           She has had guideline directed therapy interrupted by hyperkalemia in the context of Entresto.    She continues to struggle with significant fatigue and exercise intolerance.  Denies peripheral edema nocturnal dyspnea but has dyspnea with exertion.  Palpitations are associated with exertion in her mind precede dyspnea.  No syncope.  Does not smoke.  Has been noted to have tachycardia and is on carvedilol (see below)    Past Medical History:  Diagnosis Date  . Arthritis   . Arthritis of knee, left 08/25/2013  . Cardiomegaly 03/02/2018  . Hypertension   . Hyponatremia 08/26/2013  . MVP (mitral valve prolapse)   . Pre-diabetes   . Tobacco abuse       Surgical History:  Past Surgical History:  Procedure Laterality Date  . ABDOMINAL HYSTERECTOMY  94  . APPENDECTOMY    . CERVICAL DISCECTOMY  08  . KNEE ARTHROSCOPY Right 09,and ?  . RIGHT/LEFT HEART CATH AND CORONARY ANGIOGRAPHY N/A 03/09/2018   Procedure: RIGHT/LEFT HEART CATH AND CORONARY ANGIOGRAPHY;  Surgeon: Nelva Bush, MD;  Location: [facility];   Service: Cardiovascular;  Laterality: N/A;  . SHOULDER ARTHROSCOPY Left    bone spur  . TOTAL KNEE ARTHROPLASTY Right 08/25/2013   Procedure: TOTAL KNEE ARTHROPLASTY;  Surgeon: Kerin Salen, MD;  Location: [facility];  Service: Orthopedics;  Laterality: Right;     Home Meds: Prior to Admission medications   Medication Sig Start Date End Date Taking? Authorizing Provider  albuterol (PROVENTIL HFA;VENTOLIN HFA) 108 (90 Base) MCG/ACT inhaler Inhale 2 puffs into the lungs every 6 (six) hours as needed for wheezing or shortness of breath. 01/16/18  Yes [provider]  aspirin EC 81 MG tablet Take 81 mg by mouth daily.   Yes [provider]  CALCIUM PO Take 1 tablet by mouth daily.   Yes [provider]  carvedilol (COREG) 6.25 MG tablet Take 1 tablet (6.25 mg total) by mouth 2 (two) times daily. 08/03/18  Yes Isaiah Serge, NP  FLOVENT HFA 110 MCG/ACT inhaler Inhale 1 puff into the lungs 2 (two) times daily. 02/27/18  Yes [provider]  furosemide (LASIX) 40 MG tablet Take 1 tablet (40 mg total) by mouth daily. 07/31/18 07/31/19 Yes Isaiah Serge, NP  hydrALAZINE (APRESOLINE) 10 MG tablet Take 1 tablet (10 mg total) by mouth 3 (three) times daily. 04/28/18  Yes Burtis Junes, NP  ipratropium (ATROVENT) 0.06 % nasal spray Place 2 sprays into both nostrils 2 (two) times  daily. 02/24/18  Yes [provider]  isosorbide mononitrate (IMDUR) 30 MG 24 hr tablet Take 1 tablet (30 mg total) by mouth daily. 07/31/18 10/29/18 Yes Isaiah Serge, NP  Multiple Vitamins-Minerals (MULTIVITAMIN WITH MINERALS) tablet Take 1 tablet by mouth daily.   Yes [provider]  pravastatin (PRAVACHOL) 40 MG tablet Take 40 mg by mouth daily.   Yes [provider]    Allergies:  Allergies  Allergen Reactions  . Entresto [Sacubitril-Valsartan]     Marked hyperkalemia  . Guaifenesin & Derivatives Hives  . Naprosyn [Naproxen] Hives    Social History    Socioeconomic History  . Marital status: Married    Spouse name: Not on file  . Number of children: Not on file  . Years of education: Not on file  . Highest education level: Not on file  Occupational History  . Not on file  Social Needs  . Financial resource strain: Not on file  . Food insecurity:    Worry: Not on file    Inability: Not on file  . Transportation needs:    Medical: Not on file    Non-medical: Not on file  Tobacco Use  . Smoking status: Former Smoker    Packs/day: 1.00    Years: 20.00    Pack years: 20.00    Types: Cigarettes    Last attempt to quit: 04/21/2018    Years since quitting: 0.2  . Smokeless tobacco: Never Used  . Tobacco comment: Had smoked for over 20 years  Substance and Sexual Activity  . Alcohol use: Yes    Alcohol/week: 1.0 standard drinks    Types: 1 Standard drinks or equivalent per week    Comment: Rare - 1 drink every 2-3 months  . Drug use: No  . Sexual activity: Not on file  Lifestyle  . Physical activity:    Days per week: Not on file    Minutes per session: Not on file  . Stress: Not on file  Relationships  . Social connections:    Talks on phone: Not on file    Gets together: Not on file    Attends religious service: Not on file    Active member of club or organization: Not on file    Attends meetings of clubs or organizations: Not on file    Relationship status: Not on file  . Intimate partner violence:    Fear of current or ex partner: Not on file    Emotionally abused: Not on file    Physically abused: Not on file    Forced sexual activity: Not on file  Other Topics Concern  . Not on file  Social History Narrative  . Not on file     Family History  Problem Relation Age of Onset  . CAD Father        5v CABG in his 35s  . Diabetes Father   . Hypertension Father   . Liver disease Sister   . Diabetes Brother      ROS:  Please see the history of present illness.     All other systems reviewed and negative.     Physical Exam: Blood pressure 130/70, pulse 85, height 5\' 6"  (1.676 m), weight 171 lb 6.4 oz (77.7 kg). General: Well developed, well nourished female in no acute distress. Head: Normocephalic, atraumatic, sclera non-icteric, no xanthomas, nares are without discharge. EENT: normal  Lymph Nodes:  none Neck: Negative for carotid bruits. JVD 6-7 Back:without scoliosis kyphosis  Lungs: Clear bilaterally to auscultation without wheezes, rales, or rhonchi. Breathing is unlabored. Heart: RRR with S1 S2.  2/6 systolic murmur . No rubs, or gallops appreciated. Abdomen: Soft, non-tender, non-distended with normoactive bowel sounds. No hepatomegaly. No rebound/guarding. No obvious abdominal masses. Msk:  Strength and tone appear normal for age. Extremities: No clubbing or cyanosis. No edema.  Distal pedal pulses are 2+ and equal bilaterally. Skin: Warm and Dry Neuro: Alert and oriented X 3. CN III-XII intact Grossly normal sensory and motor function . Psych:  Responds to questions appropriately with a normal affect.      Labs: Cardiac Enzymes No results for input(s): CKTOTAL, CKMB, TROPONINI in the last 72 hours. CBC Lab Results  Component Value Date   WBC 6.0 07/31/2018   HGB 13.7 07/31/2018   HCT 40.7 07/31/2018   MCV 95 07/31/2018   PLT 232 07/31/2018   PROTIME: No results for input(s): LABPROT, INR in the last 72 hours. Chemistry No results for input(s): NA, K, CL, CO2, BUN, CREATININE, CALCIUM, PROT, BILITOT, ALKPHOS, ALT, AST, GLUCOSE in the last 168 hours.  Invalid input(s): LABALBU Lipids Lab Results  Component Value Date   CHOL 130 03/05/2018   HDL 45 03/05/2018   LDLCALC 66 03/05/2018   TRIG 96 03/05/2018   BNP NT-Pro BNP  Date/Time Value Ref Range Status  07/31/2018 09:39 AM 746 (H) 0 - 287 pg/mL Final    Comment:    The following cut-points have been suggested for the use of proBNP for the diagnostic evaluation of heart failure (HF) in patients with acute  dyspnea: Modality                     Age           Optimal Cut                            (years)            Point ------------------------------------------------------ Diagnosis (rule in HF)        <50            450 pg/mL                           50 - 75            900 pg/mL                               >75           1800 pg/mL Exclusion (rule out HF)  Age independent     300 pg/mL   03/25/2018 10:15 AM 2,010 (H) 0 - 287 pg/mL Final    Comment:    The following cut-points have been suggested for the use of proBNP for the diagnostic evaluation of heart failure (HF) in patients with acute dyspnea: Modality                     Age           Optimal Cut                            (years)            Point ------------------------------------------------------ Diagnosis (rule in HF)        <  50            450 pg/mL                           50 - 75            900 pg/mL                               >75           1800 pg/mL Exclusion (rule out HF)  Age independent     300 pg/mL    Thyroid Function Tests: No results for input(s): TSH, T4TOTAL, T3FREE, THYROIDAB in the last 72 hours.  Invalid input(s): FREET3 Miscellaneous No results found for: DDIMER  Radiology/Studies:  Dg Chest 2 View  Result Date: 07/31/2018 CLINICAL DATA:  Shortness of breath EXAM: CHEST - 2 VIEW COMPARISON:  03/04/2018 FINDINGS: The heart size and mediastinal contours are within normal limits. Both lungs are clear. The visualized skeletal structures are unremarkable. IMPRESSION: No active cardiopulmonary disease. Electronically Signed   By: Kathreen Devoid   On: 07/31/2018 10:44    EKG: ECG demonstrates sinus rhythm at 85 16/08/40 Nonspecific ST-T changes   Assessment and Plan:  NICM  CHF chronic systolic  Tachypalpitations  Hyperkalemia on entresto   The patient has persistent left ventricular dysfunction with a last assessment about 8 weeks ago.  She is intolerant of Entresto/ACEs with  hyperkalemia.  I suspect this also leads her not a candidate for Aldactone.  Her carvedilol and hydralazine/nitrates both are titratable however especially given her moderate hypertension.  Hence, I think drug titration for optimization is indicated prior to consideration of ICD implantation.  To that end, we will increase her hydralazine nitrates today 10--25 twice daily indoor 30--60.  Her carvedilol was just uptitrated last week; will defer further up titration.  We will refer her to the heart failure clinic for medication titration.  If her LV function remains depressed, we will be glad to see her again for consideration of an ICD following medication optimization.  In the interim, we will also do a Holter monitor as to understand the mechanism of her tachypalpitations with exertion.  I suspect that they are sinus; however, it is important to exclude other arrhythmia.       Virl Axe

## 2018-08-11 ENCOUNTER — Ambulatory Visit (INDEPENDENT_AMBULATORY_CARE_PROVIDER_SITE_OTHER): Payer: Federal, State, Local not specified - PPO

## 2018-08-11 DIAGNOSIS — R002 Palpitations: Secondary | ICD-10-CM

## 2018-08-18 ENCOUNTER — Other Ambulatory Visit: Payer: Self-pay | Admitting: Nurse Practitioner

## 2018-08-18 ENCOUNTER — Ambulatory Visit: Payer: Federal, State, Local not specified - PPO | Admitting: Nurse Practitioner

## 2018-08-18 MED ORDER — HYDRALAZINE HCL 25 MG PO TABS: 25.0000 mg | ORAL_TABLET | Freq: Three times a day (TID) | ORAL | 3 refills | Status: DC

## 2018-08-20 ENCOUNTER — Ambulatory Visit (HOSPITAL_COMMUNITY)
Admission: RE | Admit: 2018-08-20 | Discharge: 2018-08-20 | Disposition: A | Discharge status: Home / Self Care | Payer: Federal, State, Local not specified - PPO | Source: Ambulatory Visit | Attending: Cardiology | Admitting: Cardiology

## 2018-08-20 VITALS — BP 152/88 | HR 84 | Wt 174.0 lb

## 2018-08-20 DIAGNOSIS — I110 Hypertensive heart disease with heart failure: Secondary | ICD-10-CM | POA: Insufficient documentation

## 2018-08-20 DIAGNOSIS — E785 Hyperlipidemia, unspecified: Secondary | ICD-10-CM | POA: Insufficient documentation

## 2018-08-20 DIAGNOSIS — Z79899 Other long term (current) drug therapy: Secondary | ICD-10-CM | POA: Insufficient documentation

## 2018-08-20 DIAGNOSIS — Z7982 Long term (current) use of aspirin: Secondary | ICD-10-CM | POA: Diagnosis not present

## 2018-08-20 DIAGNOSIS — I10 Essential (primary) hypertension: Secondary | ICD-10-CM

## 2018-08-20 DIAGNOSIS — I5022 Chronic systolic (congestive) heart failure: Secondary | ICD-10-CM

## 2018-08-20 DIAGNOSIS — R002 Palpitations: Secondary | ICD-10-CM | POA: Diagnosis not present

## 2018-08-20 DIAGNOSIS — I429 Cardiomyopathy, unspecified: Secondary | ICD-10-CM | POA: Diagnosis not present

## 2018-08-20 LAB — BASIC METABOLIC PANEL
Anion gap: 10 (ref 5–15)
BUN: 13 mg/dL (ref 6–20)
CO2: 24 mmol/L (ref 22–32)
Calcium: 10.1 mg/dL (ref 8.9–10.3)
Chloride: 105 mmol/L (ref 98–111)
Creatinine, Ser: 0.88 mg/dL (ref 0.44–1.00)
GFR calc Af Amer: >60 mL/min (ref >60)
GFR calc non Af Amer: >60 mL/min (ref >60)
Glucose, Bld: 100 mg/dL — ABNORMAL HIGH (ref 70–99)
Potassium: 4.1 mmol/L (ref 3.5–5.1)
Sodium: 139 mmol/L (ref 135–145)

## 2018-08-20 LAB — FERRITIN: Ferritin: 46 ng/mL (ref 11–307)

## 2018-08-20 LAB — TSH: TSH: 2.099 u[IU]/mL (ref 0.350–4.500)

## 2018-08-20 LAB — IRON AND TIBC
Iron: 102 ug/dL (ref 28–170)
Saturation Ratios: 26 % (ref 10.4–31.8)
TIBC: 399 ug/dL (ref 250–450)
UIBC: 297 ug/dL

## 2018-08-20 MED ORDER — SACUBITRIL-VALSARTAN 24-26 MG PO TABS: 1.0000 | ORAL_TABLET | Freq: Two times a day (BID) | ORAL | 6 refills | Status: DC

## 2018-08-20 MED ORDER — SODIUM ZIRCONIUM CYCLOSILICATE 10 G PO PACK: 10.0000 g | PACK | Freq: Every day | ORAL | 3 refills | Status: DC

## 2018-08-20 MED ORDER — HYDRALAZINE HCL 25 MG PO TABS: 12.5000 mg | ORAL_TABLET | Freq: Three times a day (TID) | ORAL | 3 refills | Status: DC

## 2018-08-20 NOTE — Patient Instructions (Signed)
Start Lokelma 10 g daily, see pamphlet on mixing.  TAKE OTHER MEDICATIONS EITHER 2 HOUR PRIOR OR 2 HOURS AFTER [facility]   Start Entresto 24/26 mg Twice daily   Decrease Hydralazine to 12.5 mg Three times a day   Labs today  Labs again on Tuesday  Please follow up with Doroteo Bradford, our heart failure pharmacist in 3 weeks  Your physician recommends that you schedule a follow-up appointment in: 6 weeks with Dr Aundra Dubin

## 2018-08-21 LAB — MULTIPLE MYELOMA PANEL, SERUM
Albumin SerPl Elph-Mcnc: 4.1 g/dL (ref 2.9–4.4)
Albumin/Glob SerPl: 1.3 (ref 0.7–1.7)
Alpha 1: 0.2 g/dL (ref 0.0–0.4)
Alpha2 Glob SerPl Elph-Mcnc: 1.1 g/dL — ABNORMAL HIGH (ref 0.4–1.0)
B-Globulin SerPl Elph-Mcnc: 1.1 g/dL (ref 0.7–1.3)
Gamma Glob SerPl Elph-Mcnc: 0.8 g/dL (ref 0.4–1.8)
Globulin, Total: 3.2 g/dL (ref 2.2–3.9)
IgA: 231 mg/dL (ref 87–352)
IgG (Immunoglobin G), Serum: 866 mg/dL (ref 700–1600)
IgM (Immunoglobulin M), Srm: 35 mg/dL (ref 26–217)
Total Protein ELP: 7.3 g/dL (ref 6.0–8.5)

## 2018-08-21 LAB — ANA: Anti Nuclear Antibody(ANA): NEGATIVE

## 2018-08-21 NOTE — Progress Notes (Signed)
PCP: Marda Stalker, PA-C Cardiology: Dr. Tamala Julian HF Cardiology: Dr. Aundra Dubin  57 yo with history of chronic systolic CHF was referred by Dr. Caryl Comes for CHF evaluation.   She initially developed dyspnea in 11/18. She had several rounds of antibiotics for presumed bronchitis.  She did not get better, and ended up hospitalized in 3/19 where she was found to have acute systolic CHF.  Echo in 3/19 showed EF 15% with moderate-severe MR.  She was diuresed, and RHC/LHC was done => no coronary disease, low but not markedly low cardiac output.  Since then, she has had another echo in 7/19 that showed EF 25-30% and mild MR.   She remains very symptomatic.  She is short of breath with moderate exertion: sweeping her house, walking dog around the yard. She will also get chest tightness with exertion.  Mild orthopnea.  She has had palpitations and recently wore a holter monitor, results pending. She tries to follow a low K diet.  She was started on Entresto, but K rose ot 6.2 and it was stopped.  Instead, she is now on hydralazine/Imdur.    REDS clip: 25%  ECG (personally reviewed, 8/19): NSR, lateral TWIs, narrow QRS  Labs (8/19): pro-BNP 746, hgb 13.7, K 4.9, creatinine 1.01  PMH:  1. Hyperlipidemia 2. Left TKR 3. HTN 4. Chronic systolic CHF: Nonischemic cardiomyopathy.  - Echo (3/19): EF 15%, mild LV dilation, mild LVH, moderate-severe mitral regurgitation, PASP 34 mmHg.  - LHC/RHC (4/19): No CAD.  Mean RA 4, PA 31/14, mean PCWP 12, CI 2.1 Fick/2.1 Thermo.   - Echo (7/19): EF 25-30%, severe LV dilation, mild MR.   SH: Married, retired from working at [employer], nonsmoker, no ETOH, no drugs.   FH: Father with CABG in 69s.  No history of nonischemic cardiomyopathy.   ROS: All systems reviewed and negative except as per HPI.   Current Outpatient Medications  Medication Sig Dispense Refill  . albuterol (PROVENTIL HFA;VENTOLIN HFA) 108 (90 Base) MCG/ACT inhaler Inhale 2 puffs into the lungs every  6 (six) hours as needed for wheezing or shortness of breath.  2  . aspirin EC 81 MG tablet Take 81 mg by mouth daily.    Marland Kitchen CALCIUM PO Take 1 tablet by mouth daily.    . carvedilol (COREG) 6.25 MG tablet Take 1 tablet (6.25 mg total) by mouth 2 (two) times daily. 180 tablet 3  . FLOVENT HFA 110 MCG/ACT inhaler Inhale 1 puff into the lungs 2 (two) times daily.  1  . furosemide (LASIX) 40 MG tablet Take 1 tablet (40 mg total) by mouth daily. 90 tablet 3  . hydrALAZINE (APRESOLINE) 25 MG tablet Take 0.5 tablets (12.5 mg total) by mouth 3 (three) times daily. 45 tablet 3  . ipratropium (ATROVENT) 0.06 % nasal spray Place 2 sprays into both nostrils 2 (two) times daily.  0  . isosorbide mononitrate (IMDUR) 60 MG 24 hr tablet Take 1 tablet (60 mg total) by mouth daily. 30 tablet 3  . Multiple Vitamins-Minerals (MULTIVITAMIN WITH MINERALS) tablet Take 1 tablet by mouth daily.    . pravastatin (PRAVACHOL) 40 MG tablet Take 40 mg by mouth daily.    . sacubitril-valsartan (ENTRESTO) 24-26 MG Take 1 tablet by mouth 2 (two) times daily. 60 tablet 6  . sodium zirconium cyclosilicate (LOKELMA) 10 g PACK packet Take 10 g by mouth daily. 30 packet 3   No current facility-administered medications for this encounter.    BP (!) 152/88   Pulse  84   Wt 78.9 kg (174 lb)   SpO2 98%   BMI 28.08 kg/m  General: NAD Neck: No JVD, no thyromegaly or thyroid nodule.  Lungs: Clear to auscultation bilaterally with normal respiratory effort. CV: Nondisplaced PMI.  Heart regular S1/S2, no S3/S4, no murmur.  No peripheral edema.  No carotid bruit.  Normal pedal pulses.  Abdomen: Soft, nontender, no hepatosplenomegaly, no distention.  Skin: Intact without lesions or rashes.  Neurologic: Alert and oriented x 3.  Psych: Normal affect. Extremities: No clubbing or cyanosis.  HEENT: Normal.   Assessment/Plan: 1. Chronic systolic CHF: Nonischemic cardiomyopathy.  Last echo in 7/19 with some improvement, EF 25-30%, up from  15% in 3/19.  Cath in 3/19 with no coronary disease.  Symptoms began in 11/19, initially seemed like URI.  Possible viral myocarditis.  No history of ETOH or drugs.  No FH of nonischemic cardiomyopathy.  She has NYHA class III symptoms, but on exam and by REDS clip, she is not volume overloaded.   - I will arrange for CPX to assess functional capacity.  - Cardiac MRI to assess for infiltrative disease/myocarditis.  - I will not increase her Lasix.  - Continue Coreg 6.25 mg bid.  - I would like to try her on Entresto again.  This time, I will start Entresto 24/26 bid with Lokelma 10 g daily to try to control K.  Will check BMET today and again early next week.  Hopefully, Lokelma will allow [place] to get her on Entresto and spironolactone.  - Since I am starting Entresto, she can decrease hydralazine to 12.5 mg tid with Imdur 30 mg daily.  Hopefully will stop these completely able to get her on Entresto and spironolactone.  - Check ANA, Fe studies, myeloma panel.  - Narrow QRS, will not be a candidate for CRT.  Will repeat echo in 1/19 (6 months from last echo) to see if EF increases further with aggressive medical treatment.  2. HTN: Adjusting meds as above.  3. Palpitations: Holter completed, has not yet been read.   Followup with HF pharmacist in 3 wks for medication titration and with me in 6 wks.   Loralie Champagne 08/21/2018

## 2018-08-24 ENCOUNTER — Ambulatory Visit: Payer: Federal, State, Local not specified - PPO | Admitting: Internal Medicine

## 2018-08-24 ENCOUNTER — Institutional Professional Consult (permissible substitution): Payer: Federal, State, Local not specified - PPO | Admitting: Internal Medicine

## 2018-08-25 ENCOUNTER — Other Ambulatory Visit (HOSPITAL_COMMUNITY): Payer: Self-pay | Admitting: *Deleted

## 2018-08-25 ENCOUNTER — Ambulatory Visit (HOSPITAL_COMMUNITY)
Admission: RE | Admit: 2018-08-25 | Discharge: 2018-08-25 | Disposition: A | Discharge status: Home / Self Care | Payer: Federal, State, Local not specified - PPO | Source: Ambulatory Visit | Attending: Internal Medicine | Admitting: Internal Medicine

## 2018-08-25 DIAGNOSIS — I5022 Chronic systolic (congestive) heart failure: Secondary | ICD-10-CM | POA: Diagnosis not present

## 2018-08-25 LAB — BASIC METABOLIC PANEL
Anion gap: 9 (ref 5–15)
BUN: 12 mg/dL (ref 6–20)
CO2: 28 mmol/L (ref 22–32)
Calcium: 9.8 mg/dL (ref 8.9–10.3)
Chloride: 102 mmol/L (ref 98–111)
Creatinine, Ser: 0.93 mg/dL (ref 0.44–1.00)
GFR calc Af Amer: >60 mL/min (ref >60)
GFR calc non Af Amer: >60 mL/min (ref >60)
Glucose, Bld: 125 mg/dL — ABNORMAL HIGH (ref 70–99)
Potassium: 4.4 mmol/L (ref 3.5–5.1)
Sodium: 139 mmol/L (ref 135–145)

## 2018-08-26 ENCOUNTER — Telehealth (HOSPITAL_COMMUNITY): Payer: Self-pay | Admitting: Pharmacist

## 2018-08-26 NOTE — Telephone Encounter (Signed)
Lokelma PA approved by [employer] FEP through 08/26/19.   [name]. Velva Harman, PharmD, BCPS, CPP Clinical Pharmacist Phone: [phone] 08/26/2018 4:00 PM

## 2018-09-04 ENCOUNTER — Telehealth: Payer: Self-pay | Admitting: Internal Medicine

## 2018-09-04 NOTE — Telephone Encounter (Signed)
New Message:   Patient calling about some results that she received on Mychart.

## 2018-09-04 NOTE — Telephone Encounter (Signed)
Pt called to discuss holter monitor results; we reviewed Dr Olin Pia readings together. He had no additional recommendations for pt other than to continue with the heart failure clinic. Pt states she is still weak and fatigued. I encouraged her to keep up with her [facility] visits and stay compliant with her medications. I encouraged her to give herself rest breaks when needed and hopefully she may recover some of her heart function over time. Pt agrees with this and plans on keeping up with [facility]. She had no additional questions.

## 2018-09-10 ENCOUNTER — Ambulatory Visit (HOSPITAL_COMMUNITY): Payer: Federal, State, Local not specified - PPO

## 2018-09-15 NOTE — Progress Notes (Signed)
HF MD: [facility]  HPI:  57 yo with history of chronic systolic CHF was referred by Dr. Caryl Comes for CHF evaluation.   She initially developed dyspnea in 11/18. She had several rounds of antibiotics for presumed bronchitis.  She did not get better, and ended up hospitalized in 3/19 where she was found to have acute systolic CHF.  Echo in 3/19 showed EF 15% with moderate-severe MR.  She was diuresed, and RHC/LHC was done => no coronary disease, low but not markedly low cardiac output.  Since then, she has had another echo in 7/19 that showed EF 25-30% and mild MR.   Patient returns today for pharmacist-led HF medication titration. At last HF clinic visit on 9/12, she was started on Lokelma and Entresto 24-26 mg BID and hydralazine was decreased to 12.5 mg TID. She remains very symptomatic. She states that she has had 3 "good days" in the last month but mostly she has no energy and feels tired all the time. She remains short of breath with moderate exertion: sweeping her house, walking dogs (beagle and basset hound) around the yard although she was able to tolerate a 20 minute walk with her dogs recently. She also continues to get chest tightness with exertion.  Mild orthopnea. She is very frustrated because she continues to slowly gain weight even though she has been limiting her daily calories and trying to eat "healthier". She has had palpitations and recently wore a holter monitor. Dr. Caryl Comes had no additional recommendations for her other than to continue to keep up with her HF clinic visits.   . Shortness of breath/dyspnea on exertion? yes  . Orthopnea/PND? No . Edema? No . Lightheadedness/dizziness? Yes - only with over exertion . Daily weights at home? Yes - slowly increasing to 179 lb today . Blood pressure/heart rate monitoring at home? No . Following low-sodium/fluid-restricted diet? Yes - trying to stay under 1600 calories/day   HF Medications: Carvedilol 6.25 mg PO BID Furosemide 40 mg PO  daily Hydralazine 12.5 mg PO TID Imdur 60 mg PO daily Entresto 24-26 mg PO BID Lokelma 10 gm PO daily   Has the patient been experiencing any side effects to the medications prescribed?  no  Does the patient have any problems obtaining medications due to transportation or finances?   No - [employer] FEP commercial and has Entresto $10 copay card  Understanding of regimen: fair Understanding of indications: good Potential of compliance: good Patient understands to avoid NSAIDs. Patient understands to avoid decongestants.    Pertinent Lab Values: . 09/16/2018: Serum creatinine 0.84, BUN 10, Potassium 4.2, Sodium 142   Vital Signs: . Weight: 180 lb (dry weight: 174 lb) . Blood pressure: 148/88 mmHg  . Heart rate: 82 bpm    REDS clip: 25%>>29%  Assessment: 1. Chronic systolic CHF (EF 84>69-62%), due to NICM (?viral myocarditis). NYHA class III symptoms.  - Volume status stable although may be starting to trend up based on REDS clip reading and weight slowly trending up  - With stable serum K levels, will increase Entresto to 49-51 mg BID, discontinue hydralazine and decrease isosorbide mononitrate to 30 mg daily  - Continue carvedilol 6.25 mg BID, furosemide 40 mg daily, and Lokelma 10 gm daily  - Will need to be cautious with continued uptitration of Entresto and potential addition of spironolactone with previous hyperkalemia (serum K level 6.2) with Entresto off of Lokelma   - Basic disease state pathophysiology, medication indication, mechanism and side effects reviewed at length with  patient and she verbalized understanding  2. HTN:   - BP remains above goal of <130/80 mmHg  - Increasing Entresto as above  3. Palpitations:   - Holter completed, no new recommendations per Dr. Caryl Comes   Plan: 1) Medication changes: Based on clinical presentation, vital signs and recent labs will discontinue hydralazine, decrease Imdur to 30 mg daily and increase Entresto to 49-51 mg BID 2) Labs:  BMET today and at next visit  3) Follow-up: Pharmacy visit on 09/30/18 and Dr. Aundra Dubin on 10/09/18   [name]. Velva Harman, PharmD, BCPS, CPP Clinical Pharmacist Phone: [phone] 09/15/2018 3:30 PM

## 2018-09-16 ENCOUNTER — Ambulatory Visit (HOSPITAL_COMMUNITY)
Admission: RE | Admit: 2018-09-16 | Discharge: 2018-09-16 | Disposition: A | Discharge status: Home / Self Care | Payer: Federal, State, Local not specified - PPO | Source: Ambulatory Visit | Attending: Internal Medicine | Admitting: Internal Medicine

## 2018-09-16 VITALS — BP 148/88 | HR 82 | Wt 180.0 lb

## 2018-09-16 DIAGNOSIS — R002 Palpitations: Secondary | ICD-10-CM | POA: Diagnosis not present

## 2018-09-16 DIAGNOSIS — I428 Other cardiomyopathies: Secondary | ICD-10-CM | POA: Diagnosis not present

## 2018-09-16 DIAGNOSIS — I110 Hypertensive heart disease with heart failure: Secondary | ICD-10-CM | POA: Insufficient documentation

## 2018-09-16 DIAGNOSIS — Z79899 Other long term (current) drug therapy: Secondary | ICD-10-CM | POA: Diagnosis not present

## 2018-09-16 DIAGNOSIS — I5022 Chronic systolic (congestive) heart failure: Secondary | ICD-10-CM | POA: Diagnosis not present

## 2018-09-16 DIAGNOSIS — Z5181 Encounter for therapeutic drug level monitoring: Secondary | ICD-10-CM | POA: Diagnosis not present

## 2018-09-16 LAB — BASIC METABOLIC PANEL
Anion gap: 7 (ref 5–15)
BUN: 10 mg/dL (ref 6–20)
CO2: 29 mmol/L (ref 22–32)
Calcium: 10 mg/dL (ref 8.9–10.3)
Chloride: 106 mmol/L (ref 98–111)
Creatinine, Ser: 0.84 mg/dL (ref 0.44–1.00)
GFR calc Af Amer: >60 mL/min (ref >60)
GFR calc non Af Amer: >60 mL/min (ref >60)
Glucose, Bld: 99 mg/dL (ref 70–99)
Potassium: 4.2 mmol/L (ref 3.5–5.1)
Sodium: 142 mmol/L (ref 135–145)

## 2018-09-16 MED ORDER — ISOSORBIDE MONONITRATE ER 30 MG PO TB24: 30.0000 mg | ORAL_TABLET | Freq: Every day | ORAL | 5 refills | Status: DC

## 2018-09-16 MED ORDER — SACUBITRIL-VALSARTAN 49-51 MG PO TABS: 1.0000 | ORAL_TABLET | Freq: Two times a day (BID) | ORAL | 5 refills | Status: DC

## 2018-09-16 NOTE — Patient Instructions (Addendum)
It was great to meet you today!  Please STOP your hydralazine.   Please DECREASE your isosorbide mononitrate to 30 mg ONCE DAILY.   Please INCREASE your Entresto to 49-51 mg TWICE DAILY. With your current Entresto 24-26 mg, you can take #2 tablets TWICE DAILY until you pick up the higher strength.   Blood work today. We will call you with any changes.   You are scheduled with the pharmacist again on 09/30/18 at 10:00 am.   Please keep your appointment with Dr. Aundra Dubin on 10/09/18.

## 2018-09-23 ENCOUNTER — Telehealth (HOSPITAL_COMMUNITY): Payer: Self-pay | Admitting: Pharmacist

## 2018-09-23 NOTE — Telephone Encounter (Signed)
Jordan [name] called asking if it would be safe for her to get the flu shot and I advised her that it was. She also stated that for the last few days she has had some headaches and dizziness. The dizziness may be coming from the increased dose of Entresto lowering her BP. She does have an appointment with her PCP tomorrow so I have asked her to advise them of her symptoms to see if her BP is too low.   [name]. Velva Harman, PharmD, BCPS, CPP Clinical Pharmacist Phone: [phone] 09/23/2018 10:56 AM

## 2018-09-24 DIAGNOSIS — I10 Essential (primary) hypertension: Secondary | ICD-10-CM | POA: Diagnosis not present

## 2018-09-24 DIAGNOSIS — R739 Hyperglycemia, unspecified: Secondary | ICD-10-CM | POA: Diagnosis not present

## 2018-09-24 DIAGNOSIS — Z0000 Encounter for general adult medical examination without abnormal findings: Secondary | ICD-10-CM | POA: Diagnosis not present

## 2018-09-24 DIAGNOSIS — E781 Pure hyperglyceridemia: Secondary | ICD-10-CM | POA: Diagnosis not present

## 2018-09-24 DIAGNOSIS — Z23 Encounter for immunization: Secondary | ICD-10-CM | POA: Diagnosis not present

## 2018-09-24 DIAGNOSIS — I517 Cardiomegaly: Secondary | ICD-10-CM | POA: Diagnosis not present

## 2018-09-28 NOTE — Progress Notes (Signed)
HF MD: [facility]  HPI:  56 yo with history of chronic systolic CHF was referred by Dr. Caryl Comes for CHF evaluation.   She initially developed dyspnea in 11/18. She had several rounds of antibiotics for presumed bronchitis. She did not get better, and ended up hospitalized in 3/19 where she was found to have acute systolic CHF. Echo in 3/19 showed EF 15% with moderate-severe MR. She was diuresed, and RHC/LHC was done =>no coronary disease, low but not markedly low cardiac output. Since then, she has had another echo in 7/19 that showed EF 25-30% and mild MR.   Patient returns today for pharmacist-led HF medication titration. At last HF clinic visit on 10/9, her hydralazine was discontinued, Imdur was decreased to 30 mg daily and Entresto was increased to 49-51 mg BID. She is still complaining of dizziness/lightheadedness that comes and goes about 4 out of 7 days a week. She remains short of breath with moderate exertion: sweeping her house, walking dogs (beagle and basset hound) around the yard although she was able to tolerate a 20 minute walk with her dogs recently. She has had palpitations and recently wore a holter monitor. Dr. Caryl Comes had no additional recommendations for her other than to continue to keep up with her HF clinic visits.    Shortness of breath/dyspnea on exertion? yes   Orthopnea/PND? No  Edema? No  Lightheadedness/dizziness? Yes - only with over exertion (4 of 7 days) comes and goes   Daily weights at home? Yes - stable ~179 lb  Blood pressure/heart rate monitoring at home? No  Following low-sodium/fluid-restricted diet? Yes - trying to stay under 1600 calories/day   HF Medications: Carvedilol 6.25 mg PO BID Furosemide 40 mg PO daily Imdur 30 mg PO daily Entresto 49-51 mg PO BID Lokelma 10 gm PO daily   Has the patient been experiencing any side effects to the medications prescribed?  no  Does the patient have any problems obtaining medications due to  transportation or finances?   No - [employer] F[employer] commercial and has Entresto $10 copay card  Understanding of regimen: fair Understanding of indications: good Potential of compliance: good Patient understands to avoid NSAIDs. Patient understands to avoid decongestants.   Pertinent Lab Values:  09/30/2018: Serum creatinine 0.87, BUN 13, Potassium 4.3, Sodium 142   Vital Signs:  Weight: 179.2 lb (dry weight: 174-180 lb)  Blood pressure: 138/92 mmHg   Heart rate: 81 bpm    REDS clip: 25% (9/12) >>29% (10/9) >>25% (10/23)  Assessment: 1. Chronicsystolic CHF (EF 63>87-56%), due to NICM (?viral myocarditis). NYHA class IIIsymptoms.  - Volume status stable  - I don't think her dizziness is related to BP since her BP is high, but may be due to PVCs so will increase carvedilol to 12.5 mg BID  - Continue furosemide 40 mg daily, Entresto 49-51 mg BID and Lokelma 10 gm daily  - Will need to be cautious with continued uptitration of Entresto and potential addition of spironolactone with previous hyperkalemia (serum K level 6.2) with Entresto off of Lokelma  - Basic disease state pathophysiology, medication indication, mechanism and side effects reviewed at length with patient and she verbalized understanding  2. HTN:   - BP remains above goal of <130/80 mmHg  - Increasing carvedilol as above   3. Palpitations:   - Holter completed, no new recommendations per Dr. Caryl Comes  Plan: 1) Medication changes: Based on clinical presentation, vital signs and recent labs will increase carvedilol to 12.5 mg BID 2) Labs:  BMET today  3) Follow-up: Dr. Aundra Dubin on 10/09/18   [name]. Velva Harman, PharmD, BCPS, [place] Clinical Pharmacist Phone: [phone]

## 2018-09-30 ENCOUNTER — Ambulatory Visit (HOSPITAL_COMMUNITY)
Admission: RE | Admit: 2018-09-30 | Discharge: 2018-09-30 | Disposition: A | Discharge status: Home / Self Care | Payer: Federal, State, Local not specified - PPO | Source: Ambulatory Visit | Attending: Internal Medicine | Admitting: Internal Medicine

## 2018-09-30 VITALS — BP 138/92 | HR 81 | Wt 179.2 lb

## 2018-09-30 DIAGNOSIS — I110 Hypertensive heart disease with heart failure: Secondary | ICD-10-CM | POA: Diagnosis not present

## 2018-09-30 DIAGNOSIS — R002 Palpitations: Secondary | ICD-10-CM | POA: Diagnosis not present

## 2018-09-30 DIAGNOSIS — I428 Other cardiomyopathies: Secondary | ICD-10-CM | POA: Diagnosis not present

## 2018-09-30 DIAGNOSIS — Z79899 Other long term (current) drug therapy: Secondary | ICD-10-CM | POA: Diagnosis not present

## 2018-09-30 DIAGNOSIS — I5022 Chronic systolic (congestive) heart failure: Secondary | ICD-10-CM | POA: Diagnosis not present

## 2018-09-30 LAB — BASIC METABOLIC PANEL
Anion gap: 7 (ref 5–15)
BUN: 13 mg/dL (ref 6–20)
CO2: 30 mmol/L (ref 22–32)
Calcium: 10.2 mg/dL (ref 8.9–10.3)
Chloride: 105 mmol/L (ref 98–111)
Creatinine, Ser: 0.87 mg/dL (ref 0.44–1.00)
GFR calc Af Amer: >60 mL/min (ref >60)
GFR calc non Af Amer: >60 mL/min (ref >60)
Glucose, Bld: 102 mg/dL — ABNORMAL HIGH (ref 70–99)
Potassium: 4.3 mmol/L (ref 3.5–5.1)
Sodium: 142 mmol/L (ref 135–145)

## 2018-09-30 MED ORDER — CARVEDILOL 12.5 MG PO TABS: 12.5000 mg | ORAL_TABLET | Freq: Two times a day (BID) | ORAL | 5 refills | Status: DC

## 2018-09-30 NOTE — Patient Instructions (Addendum)
It was great to see you today!  Please INCREASE your carvedilol 12.5 mg (1 tablet) TWICE DAILY.   Blood work today. We will call you with any changes.   You are scheduled with Dr. Aundra Dubin on 10/09/18.

## 2018-10-01 ENCOUNTER — Encounter (HOSPITAL_COMMUNITY): Payer: Self-pay | Admitting: *Deleted

## 2018-10-01 NOTE — Progress Notes (Signed)
Received urgent request from [facility] requested records from our office, pt had signed consent form.  Records faxed to them at [phone]

## 2018-10-09 ENCOUNTER — Encounter (HOSPITAL_COMMUNITY): Payer: Federal, State, Local not specified - PPO | Admitting: Cardiology

## 2018-10-23 DIAGNOSIS — F410 Panic disorder [episodic paroxysmal anxiety] without agoraphobia: Secondary | ICD-10-CM | POA: Diagnosis not present

## 2018-10-26 ENCOUNTER — Telehealth (HOSPITAL_COMMUNITY): Payer: Self-pay | Admitting: Pharmacist

## 2018-10-26 NOTE — Telephone Encounter (Signed)
Patient was started on metformin, alprazolam and hydroxyzine by PCP. Will add to med list.   Doroteo Bradford K. Velva Harman, PharmD, BCPS, CPP Clinical Pharmacist Phone: [phone] 10/26/2018 2:58 PM

## 2018-11-17 ENCOUNTER — Other Ambulatory Visit (HOSPITAL_COMMUNITY): Payer: Self-pay | Admitting: Pharmacist

## 2018-11-17 MED ORDER — AMOXICILLIN 500 MG PO TABS: ORAL_TABLET | ORAL | 0 refills | Status: AC

## 2018-11-17 NOTE — Progress Notes (Signed)
Jordan [name] states that she was taking amoxicillin prophylaxis prior to her dental appointments when she was seeing Dr. Tamala Julian. She no longer sees him. It looks like she has mitral valve prolapse/MR (unrepaired) but no real indication for it. Upon questioning, Jordan [name] states that she was prescribed the prophylaxis d/t her knee replacement. Per the ADA guidelines, most patients with prosthetic joints do not require abx px prior to dental procedures. Per discussion with Dr. Aundra Dubin, will send in this once but have her contact the orthopedic surgeon in the future for further recommendations.   Jordan [name]. Jordan [name], PharmD, BCPS, CPP Clinical Pharmacist Phone: [phone] 11/17/2018 4:07 PM

## 2018-11-23 DIAGNOSIS — K080 Exfoliation of teeth due to systemic causes: Secondary | ICD-10-CM | POA: Diagnosis not present

## 2018-12-07 DIAGNOSIS — F410 Panic disorder [episodic paroxysmal anxiety] without agoraphobia: Secondary | ICD-10-CM | POA: Diagnosis not present

## 2018-12-19 ENCOUNTER — Other Ambulatory Visit (HOSPITAL_COMMUNITY): Payer: Self-pay | Admitting: Cardiology

## 2018-12-24 DIAGNOSIS — J209 Acute bronchitis, unspecified: Secondary | ICD-10-CM | POA: Diagnosis not present

## 2018-12-24 DIAGNOSIS — J069 Acute upper respiratory infection, unspecified: Secondary | ICD-10-CM | POA: Diagnosis not present

## 2018-12-29 ENCOUNTER — Telehealth (HOSPITAL_COMMUNITY): Payer: Self-pay

## 2018-12-29 ENCOUNTER — Encounter (HOSPITAL_COMMUNITY): Payer: Self-pay | Admitting: Cardiology

## 2018-12-29 ENCOUNTER — Ambulatory Visit (HOSPITAL_COMMUNITY)
Admission: RE | Admit: 2018-12-29 | Discharge: 2018-12-29 | Disposition: A | Discharge status: Home / Self Care | Payer: Federal, State, Local not specified - PPO | Source: Ambulatory Visit | Attending: Cardiology | Admitting: Cardiology

## 2018-12-29 VITALS — BP 150/90 | HR 98 | Wt 186.0 lb

## 2018-12-29 DIAGNOSIS — Z7982 Long term (current) use of aspirin: Secondary | ICD-10-CM | POA: Diagnosis not present

## 2018-12-29 DIAGNOSIS — Z7984 Long term (current) use of oral hypoglycemic drugs: Secondary | ICD-10-CM | POA: Diagnosis not present

## 2018-12-29 DIAGNOSIS — Z79899 Other long term (current) drug therapy: Secondary | ICD-10-CM | POA: Insufficient documentation

## 2018-12-29 DIAGNOSIS — I428 Other cardiomyopathies: Secondary | ICD-10-CM | POA: Insufficient documentation

## 2018-12-29 DIAGNOSIS — I10 Essential (primary) hypertension: Secondary | ICD-10-CM

## 2018-12-29 DIAGNOSIS — I5022 Chronic systolic (congestive) heart failure: Secondary | ICD-10-CM | POA: Diagnosis not present

## 2018-12-29 DIAGNOSIS — Z96652 Presence of left artificial knee joint: Secondary | ICD-10-CM | POA: Insufficient documentation

## 2018-12-29 DIAGNOSIS — R002 Palpitations: Secondary | ICD-10-CM | POA: Insufficient documentation

## 2018-12-29 DIAGNOSIS — I340 Nonrheumatic mitral (valve) insufficiency: Secondary | ICD-10-CM | POA: Diagnosis not present

## 2018-12-29 DIAGNOSIS — I110 Hypertensive heart disease with heart failure: Secondary | ICD-10-CM | POA: Diagnosis not present

## 2018-12-29 DIAGNOSIS — E785 Hyperlipidemia, unspecified: Secondary | ICD-10-CM | POA: Diagnosis not present

## 2018-12-29 LAB — BASIC METABOLIC PANEL
Anion gap: 10 (ref 5–15)
BUN: 13 mg/dL (ref 6–20)
CO2: 25 mmol/L (ref 22–32)
Calcium: 9.9 mg/dL (ref 8.9–10.3)
Chloride: 103 mmol/L (ref 98–111)
Creatinine, Ser: 0.85 mg/dL (ref 0.44–1.00)
GFR calc Af Amer: >60 mL/min (ref >60)
GFR calc non Af Amer: >60 mL/min (ref >60)
Glucose, Bld: 94 mg/dL (ref 70–99)
Potassium: 5 mmol/L (ref 3.5–5.1)
Sodium: 138 mmol/L (ref 135–145)

## 2018-12-29 MED ORDER — CARVEDILOL 12.5 MG PO TABS: 18.7500 mg | ORAL_TABLET | Freq: Two times a day (BID) | ORAL | 5 refills | Status: DC

## 2018-12-29 MED ORDER — SPIRONOLACTONE 25 MG PO TABS: 12.5000 mg | ORAL_TABLET | Freq: Every day | ORAL | 3 refills | Status: DC

## 2018-12-29 NOTE — Patient Instructions (Addendum)
Please check blood pressure daily and record readings. Please call our office in 2 weeks with your blood pressure readings. (you may also send the readings via MyChart)  Increase Carvedilol to 18.75mg  twice daily.  Start Spironolactone 12.5mg  daily  Stop Isosorbide mononitrate.  Routine lab work today. Will notify you of abnormal results  Repeat labs in 1 week.   Your provider requests you have a Cardiopulmonary exercise test. (CPX)   Your provider requests you have a Cardiac MRI. (they will contact you to schedule appointment)  Follow up with Dr.McLean in 6 weeks.

## 2018-12-29 NOTE — Telephone Encounter (Signed)
Information requested by disability determination services was faxed to [phone] case number [id number]

## 2018-12-30 NOTE — Progress Notes (Signed)
PCP: Marda Stalker, PA-C Cardiology: Dr. Tamala Julian HF Cardiology: Dr. Aundra Dubin  58 yo with history of chronic systolic CHF was referred by Dr. Caryl Comes for CHF evaluation.   She initially developed dyspnea in 11/18. She had several rounds of antibiotics for presumed bronchitis.  She did not get better, and ended up hospitalized in 3/19 where she was found to have acute systolic CHF.  Echo in 3/19 showed EF 15% with moderate-severe MR.  She was diuresed, and RHC/LHC was done => no coronary disease, low but not markedly low cardiac output.  Since then, she has had another echo in 7/19 that showed EF 25-30% and mild MR.   She presents today for evaluation of CHF.  She has been doing better recently.  She can walk 1/2 block before becoming short of breath.  She is less fatigued.  Also not feeling palpitations.  No orthopnea/PND.  No chest pain.  Weight is up but she denies peripheral edema and thinks it is due to eating over the holidays. K is controlled by [facility].   Labs (8/19): pro-BNP 746, hgb 13.7, K 4.9, creatinine 1.01 Labs (9/19): ANA negative, myeloma panel negative, TSH normal, Fe studies normal.  Labs (10/19): K 4.3, creatinine 0.87  PMH:  1. Hyperlipidemia 2. Left TKR 3. HTN 4. Chronic systolic CHF: Nonischemic cardiomyopathy.  - Echo (3/19): EF 15%, mild LV dilation, mild LVH, moderate-severe mitral regurgitation, PASP 34 mmHg.  - LHC/RHC (4/19): No CAD.  Mean RA 4, PA 31/14, mean PCWP 12, CI 2.1 Fick/2.1 Thermo.   - Echo (7/19): EF 25-30%, severe LV dilation, mild MR.  5. Palpitations: Holter (9/19) with occasional PVCs.   SH: Married, retired from working at [employer], nonsmoker, no ETOH, no drugs.   FH: Father with CABG in 36s.  No history of nonischemic cardiomyopathy.   ROS: All systems reviewed and negative except as per HPI.   Current Outpatient Medications  Medication Sig Dispense Refill  . aspirin EC 81 MG tablet Take 81 mg by mouth daily.    Marland Kitchen CALCIUM PO Take 1  tablet by mouth daily.    . carvedilol (COREG) 12.5 MG tablet Take 1.5 tablets (18.75 mg total) by mouth 2 (two) times daily. 90 tablet 5  . clonazePAM (KLONOPIN) 0.5 MG tablet Take 0.5 mg by mouth at bedtime.    . furosemide (LASIX) 40 MG tablet Take 1 tablet (40 mg total) by mouth daily. 90 tablet 3  . hydrALAZINE (APRESOLINE) 25 MG tablet     . ipratropium (ATROVENT) 0.06 % nasal spray Place 2 sprays into both nostrils 2 (two) times daily.  0  . LOKELMA 10 g PACK packet TAKE 10 G BY MOUTH DAILY. 30 packet 3  . metFORMIN (GLUCOPHAGE-XR) 500 MG 24 hr tablet Take 500 mg by mouth daily with breakfast.    . Multiple Vitamins-Minerals (MULTIVITAMIN WITH MINERALS) tablet Take 1 tablet by mouth daily.    . pravastatin (PRAVACHOL) 40 MG tablet Take 40 mg by mouth daily.    . sacubitril-valsartan (ENTRESTO) 49-51 MG Take 1 tablet by mouth 2 (two) times daily. 60 tablet 5  . Venlafaxine HCl 225 MG TB24 Take 225 mg by mouth daily. For hot flashes  1  . albuterol (PROVENTIL HFA;VENTOLIN HFA) 108 (90 Base) MCG/ACT inhaler Inhale 2 puffs into the lungs every 6 (six) hours as needed for wheezing or shortness of breath.  2  . amoxicillin (AMOXIL) 500 MG tablet Take 4 tablets 30-60 minutes before procedure (Patient not taking: Reported  on 12/29/2018) 4 tablet 0  . spironolactone (ALDACTONE) 25 MG tablet Take 0.5 tablets (12.5 mg total) by mouth daily for 15 days. 90 tablet 3   No current facility-administered medications for this encounter.    BP (!) 150/90 (BP Location: Right Arm)   Pulse 98   Wt 84.4 kg (186 lb)   SpO2 98%   BMI 30.02 kg/m  General: NAD Neck: No JVD, no thyromegaly or thyroid nodule.  Lungs: Clear to auscultation bilaterally with normal respiratory effort. CV: Nondisplaced PMI.  Heart regular S1/S2, no S3/S4, no murmur.  No peripheral edema.  No carotid bruit.  Normal pedal pulses.  Abdomen: Soft, nontender, no hepatosplenomegaly, no distention.  Skin: Intact without lesions or  rashes.  Neurologic: Alert and oriented x 3.  Psych: Normal affect. Extremities: No clubbing or cyanosis.  HEENT: Normal.    Assessment/Plan: 1. Chronic systolic CHF: Nonischemic cardiomyopathy.  Last echo in 7/19 with some improvement, EF 25-30%, up from 15% in 3/19.  Cath in 3/19 with no coronary disease.  Symptoms began in 11/19, initially seemed like URI.  Possible viral myocarditis.  No history of ETOH or drugs.  No FH of nonischemic cardiomyopathy.  She has NYHA class III symptoms with some improvement since starting Entresto.  On exam, she is not volume overloaded.  BP is elevated.  - Increase Coreg to 18.75 mg bid.  - Continue Entresto 49/51 bid.  - Add spironolactone 12.5 mg daily. Continue Lokelma.  - Check BMET today and in 7 days.   - I will arrange for CPX to assess functional capacity.  - Cardiac MRI to assess for infiltrative disease/myocarditis.  - Continue Lasix 40 mg daily.  - She is off hydralazine now, can also stop Imdur. - Narrow QRS, will not be a candidate for CRT.  As above, will be getting cardiac MRI.  If EF remains low, will need to discuss ICD.  2. HTN: Adjusting meds as above. She will get a home BP cuff and check BP daily x 2 wks on new regimen, calling readings at that time to the office.  3. Palpitations: Minimal.  Holter in 9/19 with occasional PVCs.   Followup in 6 wks.   Loralie Champagne 12/30/2018

## 2019-01-05 ENCOUNTER — Ambulatory Visit (HOSPITAL_COMMUNITY)
Admission: RE | Admit: 2019-01-05 | Discharge: 2019-01-05 | Disposition: A | Discharge status: Home / Self Care | Payer: Federal, State, Local not specified - PPO | Source: Ambulatory Visit | Attending: Cardiology | Admitting: Cardiology

## 2019-01-05 DIAGNOSIS — I5022 Chronic systolic (congestive) heart failure: Secondary | ICD-10-CM | POA: Diagnosis not present

## 2019-01-05 LAB — BASIC METABOLIC PANEL
Anion gap: 7 (ref 5–15)
BUN: 14 mg/dL (ref 6–20)
CO2: 28 mmol/L (ref 22–32)
Calcium: 10 mg/dL (ref 8.9–10.3)
Chloride: 104 mmol/L (ref 98–111)
Creatinine, Ser: 0.88 mg/dL (ref 0.44–1.00)
GFR calc Af Amer: >60 mL/min (ref >60)
GFR calc non Af Amer: >60 mL/min (ref >60)
Glucose, Bld: 103 mg/dL — ABNORMAL HIGH (ref 70–99)
Potassium: 4.3 mmol/L (ref 3.5–5.1)
Sodium: 139 mmol/L (ref 135–145)

## 2019-01-08 ENCOUNTER — Encounter (HOSPITAL_COMMUNITY): Payer: Self-pay

## 2019-01-11 ENCOUNTER — Ambulatory Visit (HOSPITAL_COMMUNITY): Payer: Federal, State, Local not specified - PPO | Attending: Internal Medicine

## 2019-01-11 DIAGNOSIS — Z79899 Other long term (current) drug therapy: Secondary | ICD-10-CM | POA: Diagnosis not present

## 2019-01-11 DIAGNOSIS — Z87891 Personal history of nicotine dependence: Secondary | ICD-10-CM | POA: Diagnosis not present

## 2019-01-11 DIAGNOSIS — Z7982 Long term (current) use of aspirin: Secondary | ICD-10-CM | POA: Diagnosis not present

## 2019-01-11 DIAGNOSIS — I110 Hypertensive heart disease with heart failure: Secondary | ICD-10-CM | POA: Diagnosis not present

## 2019-01-11 DIAGNOSIS — E785 Hyperlipidemia, unspecified: Secondary | ICD-10-CM | POA: Insufficient documentation

## 2019-01-11 DIAGNOSIS — I5022 Chronic systolic (congestive) heart failure: Secondary | ICD-10-CM | POA: Insufficient documentation

## 2019-01-12 DIAGNOSIS — I5022 Chronic systolic (congestive) heart failure: Secondary | ICD-10-CM

## 2019-01-18 DIAGNOSIS — K080 Exfoliation of teeth due to systemic causes: Secondary | ICD-10-CM | POA: Diagnosis not present

## 2019-01-19 ENCOUNTER — Telehealth (HOSPITAL_COMMUNITY): Payer: Self-pay

## 2019-01-19 NOTE — Telephone Encounter (Signed)
Pt aware of results of CPX test and appreciative.

## 2019-01-19 NOTE — Telephone Encounter (Signed)
-----   Message from Larey Dresser, MD sent at 01/15/2019  4:12 PM EST ----- No clear cardiopulmonary limitation.  Suspect limited by body habitus/deconditioning.

## 2019-01-25 ENCOUNTER — Other Ambulatory Visit: Payer: Self-pay | Admitting: Nurse Practitioner

## 2019-01-26 NOTE — Telephone Encounter (Signed)
Spoke with pt and she states she is NOT taking Hydralazine.  States she is unsure why her pharmacy is even requesting this refill as she didn't ask for it.  Advised we will refuse refill request.  Pt appreciative for call.

## 2019-01-26 NOTE — Telephone Encounter (Signed)
Pt pharmacy requesting refill for medication, but medication was removed from pt list. Please address. Thank you.

## 2019-02-09 ENCOUNTER — Encounter (HOSPITAL_COMMUNITY): Payer: Self-pay | Admitting: Cardiology

## 2019-02-09 ENCOUNTER — Ambulatory Visit (HOSPITAL_COMMUNITY)
Admission: RE | Admit: 2019-02-09 | Discharge: 2019-02-09 | Disposition: A | Discharge status: Home / Self Care | Payer: Federal, State, Local not specified - PPO | Source: Ambulatory Visit | Attending: Cardiology | Admitting: Cardiology

## 2019-02-09 VITALS — BP 150/88 | HR 95 | Wt 187.6 lb

## 2019-02-09 DIAGNOSIS — Z79899 Other long term (current) drug therapy: Secondary | ICD-10-CM | POA: Insufficient documentation

## 2019-02-09 DIAGNOSIS — R002 Palpitations: Secondary | ICD-10-CM | POA: Diagnosis not present

## 2019-02-09 DIAGNOSIS — I340 Nonrheumatic mitral (valve) insufficiency: Secondary | ICD-10-CM | POA: Diagnosis not present

## 2019-02-09 DIAGNOSIS — I110 Hypertensive heart disease with heart failure: Secondary | ICD-10-CM | POA: Insufficient documentation

## 2019-02-09 DIAGNOSIS — I428 Other cardiomyopathies: Secondary | ICD-10-CM | POA: Insufficient documentation

## 2019-02-09 DIAGNOSIS — E875 Hyperkalemia: Secondary | ICD-10-CM | POA: Diagnosis not present

## 2019-02-09 DIAGNOSIS — Z7982 Long term (current) use of aspirin: Secondary | ICD-10-CM | POA: Diagnosis not present

## 2019-02-09 DIAGNOSIS — E785 Hyperlipidemia, unspecified: Secondary | ICD-10-CM | POA: Diagnosis not present

## 2019-02-09 DIAGNOSIS — Z7984 Long term (current) use of oral hypoglycemic drugs: Secondary | ICD-10-CM | POA: Diagnosis not present

## 2019-02-09 DIAGNOSIS — I5022 Chronic systolic (congestive) heart failure: Secondary | ICD-10-CM | POA: Diagnosis not present

## 2019-02-09 LAB — BASIC METABOLIC PANEL
Anion gap: 9 (ref 5–15)
BUN: 14 mg/dL (ref 6–20)
CO2: 27 mmol/L (ref 22–32)
Calcium: 10 mg/dL (ref 8.9–10.3)
Chloride: 104 mmol/L (ref 98–111)
Creatinine, Ser: 0.99 mg/dL (ref 0.44–1.00)
GFR calc Af Amer: >60 mL/min (ref >60)
GFR calc non Af Amer: >60 mL/min (ref >60)
Glucose, Bld: 127 mg/dL — ABNORMAL HIGH (ref 70–99)
Potassium: 3.8 mmol/L (ref 3.5–5.1)
Sodium: 140 mmol/L (ref 135–145)

## 2019-02-09 MED ORDER — SACUBITRIL-VALSARTAN 97-103 MG PO TABS: 1.0000 | ORAL_TABLET | Freq: Two times a day (BID) | ORAL | 3 refills | Status: DC

## 2019-02-09 NOTE — Patient Instructions (Signed)
INCREASE Entresto to 97/103mg  twice daily.  Routine lab work today. Will notify you of abnormal results  Repeat labs in 10 days.  You have been referred to cardiac rehab. (they will contact you to schedule appointment)  Follow up with Dr.McLean in 2 months

## 2019-02-09 NOTE — Progress Notes (Signed)
PCP: Marda Stalker, PA-C Cardiology: Dr. Tamala Julian HF Cardiology: Dr. Aundra Dubin  58 y.o. with history of chronic systolic CHF was referred by Dr. Caryl Comes for CHF evaluation.   She initially developed dyspnea in 11/18. She had several rounds of antibiotics for presumed bronchitis.  She did not get better, and ended up hospitalized in 3/19 where she was found to have acute systolic CHF.  Echo in 3/19 showed EF 15% with moderate-severe MR.  She was diuresed, and RHC/LHC was done => no coronary disease, low but not markedly low cardiac output.  Since then, she has had another echo in 7/19 that showed EF 25-30% and mild MR.   She presents today for followup of CHF.  She is short of breath after walking about 3/4 block. She is trying to walk for exercise.  Weight is stable.  No chest pain. No lightheadedness.  No orthopnea/PND. BP is elevated today.  Labs (8/19): pro-BNP 746, hgb 13.7, K 4.9, creatinine 1.01 Labs (9/19): ANA negative, myeloma panel negative, TSH normal, Fe studies normal.  Labs (10/19): K 4.3, creatinine 0.87 Labs (1/20): K 4.3, creatinine 0.88  PMH:  1. Hyperlipidemia 2. Left TKR 3. HTN 4. Chronic systolic CHF: Nonischemic cardiomyopathy.  - Echo (3/19): EF 15%, mild LV dilation, mild LVH, moderate-severe mitral regurgitation, PASP 34 mmHg.  - LHC/RHC (4/19): No CAD.  Mean RA 4, PA 31/14, mean PCWP 12, CI 2.1 Fick/2.1 Thermo.   - Echo (7/19): EF 25-30%, severe LV dilation, mild MR.  - CPX (2/20): RER 1.3, VE/VCO2 27, VO2 15.8 => no significant HF limitation, appears to be limited mainly due to deconditioning.  5. Palpitations: Holter (9/19) with occasional PVCs.   SH: Married, retired from working at [employer], nonsmoker, no ETOH, no drugs.   FH: Father with CABG in 64s.  No history of nonischemic cardiomyopathy.   ROS: All systems reviewed and negative except as per HPI.   Current Outpatient Medications  Medication Sig Dispense Refill  . albuterol (PROVENTIL HFA;VENTOLIN  HFA) 108 (90 Base) MCG/ACT inhaler Inhale 2 puffs into the lungs every 6 (six) hours as needed for wheezing or shortness of breath.  2  . amoxicillin (AMOXIL) 500 MG tablet Take 4 tablets 30-60 minutes before procedure 4 tablet 0  . aspirin EC 81 MG tablet Take 81 mg by mouth daily.    Marland Kitchen CALCIUM PO Take 1 tablet by mouth daily.    . carvedilol (COREG) 12.5 MG tablet Take 1.5 tablets (18.75 mg total) by mouth 2 (two) times daily. 90 tablet 5  . clonazePAM (KLONOPIN) 0.5 MG tablet Take 0.5 mg by mouth at bedtime.    . furosemide (LASIX) 40 MG tablet Take 1 tablet (40 mg total) by mouth daily. 90 tablet 3  . ipratropium (ATROVENT) 0.06 % nasal spray Place 2 sprays into both nostrils 2 (two) times daily.  0  . LOKELMA 10 g PACK packet TAKE 10 G BY MOUTH DAILY. 30 packet 3  . metFORMIN (GLUCOPHAGE-XR) 500 MG 24 hr tablet Take 500 mg by mouth daily with breakfast.    . Multiple Vitamins-Minerals (MULTIVITAMIN WITH MINERALS) tablet Take 1 tablet by mouth daily.    . pravastatin (PRAVACHOL) 40 MG tablet Take 40 mg by mouth daily.    Marland Kitchen spironolactone (ALDACTONE) 25 MG tablet Take 0.5 tablets (12.5 mg total) by mouth daily for 15 days. 90 tablet 3  . Venlafaxine HCl 225 MG TB24 Take 225 mg by mouth daily. For hot flashes  1  . sacubitril-valsartan (  ENTRESTO) 97-103 MG Take 1 tablet by mouth 2 (two) times daily. 60 tablet 3   No current facility-administered medications for this encounter.    BP (!) 150/88   Pulse 95   Wt 85.1 kg (187 lb 9.6 oz)   SpO2 92%   BMI 30.28 kg/m  General: NAD Neck: No JVD, no thyromegaly or thyroid nodule.  Lungs: Clear to auscultation bilaterally with normal respiratory effort. CV: Nondisplaced PMI.  Heart regular S1/S2, no S3/S4, no murmur.  No peripheral edema.  No carotid bruit.  Normal pedal pulses.  Abdomen: Soft, nontender, no hepatosplenomegaly, no distention.  Skin: Intact without lesions or rashes.  Neurologic: Alert and oriented x 3.  Psych: Normal  affect. Extremities: No clubbing or cyanosis.  HEENT: Normal.   Assessment/Plan: 1. Chronic systolic CHF: Nonischemic cardiomyopathy.  Last echo in 7/19 with some improvement, EF 25-30%, up from 15% in 3/19.  Cath in 3/19 with no coronary disease.  Symptoms began in 11/19, initially seemed like URI.  Possible viral myocarditis.  No history of ETOH or drugs.  No FH of nonischemic cardiomyopathy.  CPX in 2/20 suggested minimal HF limitation, primarily limited by deconditioning.  She has NYHA class II symptoms, not volume overloaded on exam.   - Continue Coreg 18.75 mg bid.   - Increase Entresto to 97/103 bid, BMET today and again in 10 days.   - Continue spironolactone 12.5 mg daily. Continue Lokelma.  - Cardiac MRI to assess for infiltrative disease/myocarditis scheduled for 02/23/19.  If EF < 35%, recommend ICD placement.  Narrow QRS so not CRT candidate.  - Continue Lasix 40 mg daily.  2. HTN: Increasing Entresto as above.  3. Palpitations: Minimal.  Holter in 9/19 with occasional PVCs.  4. Hyperkalemia: She is now on Lokelma and has been able to tolerate spironolactone.  - BMET today.  5. Deconditioning: This appeared to be her primary limitation on CPX.  - I will refer her for cardiac rehab.   Followup in 2 months.    Jordan [name] 02/09/2019

## 2019-02-16 DIAGNOSIS — K080 Exfoliation of teeth due to systemic causes: Secondary | ICD-10-CM | POA: Diagnosis not present

## 2019-02-19 ENCOUNTER — Ambulatory Visit (HOSPITAL_COMMUNITY)
Admission: RE | Admit: 2019-02-19 | Discharge: 2019-02-19 | Disposition: A | Discharge status: Home / Self Care | Payer: Federal, State, Local not specified - PPO | Source: Ambulatory Visit | Attending: Cardiology | Admitting: Cardiology

## 2019-02-19 ENCOUNTER — Other Ambulatory Visit: Payer: Self-pay

## 2019-02-19 DIAGNOSIS — I5022 Chronic systolic (congestive) heart failure: Secondary | ICD-10-CM | POA: Diagnosis not present

## 2019-02-19 LAB — BASIC METABOLIC PANEL
Anion gap: 9 (ref 5–15)
BUN: 14 mg/dL (ref 6–20)
CO2: 28 mmol/L (ref 22–32)
Calcium: 9.8 mg/dL (ref 8.9–10.3)
Chloride: 103 mmol/L (ref 98–111)
Creatinine, Ser: 0.97 mg/dL (ref 0.44–1.00)
GFR calc Af Amer: >60 mL/min (ref >60)
GFR calc non Af Amer: >60 mL/min (ref >60)
Glucose, Bld: 153 mg/dL — ABNORMAL HIGH (ref 70–99)
Potassium: 4.2 mmol/L (ref 3.5–5.1)
Sodium: 140 mmol/L (ref 135–145)

## 2019-02-22 ENCOUNTER — Telehealth (HOSPITAL_COMMUNITY): Payer: Self-pay | Admitting: Emergency Medicine

## 2019-02-22 NOTE — Telephone Encounter (Signed)
Left message on voicemail with name and callback number Osborne Serio RN Navigator Cardiac Imaging [facility] [phone] Office [phone] Cell  

## 2019-02-23 ENCOUNTER — Telehealth (HOSPITAL_COMMUNITY): Payer: Self-pay | Admitting: Emergency Medicine

## 2019-02-23 ENCOUNTER — Other Ambulatory Visit: Payer: Self-pay

## 2019-02-23 ENCOUNTER — Ambulatory Visit (HOSPITAL_COMMUNITY)
Admission: RE | Admit: 2019-02-23 | Discharge: 2019-02-23 | Disposition: A | Discharge status: Home / Self Care | Payer: Federal, State, Local not specified - PPO | Source: Ambulatory Visit | Attending: Cardiology | Admitting: Cardiology

## 2019-02-23 DIAGNOSIS — I5022 Chronic systolic (congestive) heart failure: Secondary | ICD-10-CM

## 2019-02-23 MED ORDER — GADOBUTROL 1 MMOL/ML IV SOLN
8.0000 mL | Freq: Once | INTRAVENOUS | Status: AC | PRN
  Administered 2019-02-23: 8 mL via INTRAVENOUS

## 2019-02-23 NOTE — Telephone Encounter (Signed)
Left message on voicemail with name and callback number Jaicey Sweaney RN Navigator Cardiac Imaging [facility] [phone] Office [phone] Cell  

## 2019-02-25 ENCOUNTER — Telehealth (HOSPITAL_COMMUNITY): Payer: Self-pay

## 2019-02-25 NOTE — Telephone Encounter (Signed)
-----   Message from Larey Dresser, MD sent at 02/24/2019  9:01 PM EDT ----- EF improved, 46%.  Above ICD range.

## 2019-02-25 NOTE — Telephone Encounter (Signed)
Left VM to return call regarding test results

## 2019-02-26 ENCOUNTER — Telehealth (HOSPITAL_COMMUNITY): Payer: Self-pay

## 2019-02-26 ENCOUNTER — Telehealth (HOSPITAL_COMMUNITY): Payer: Self-pay | Admitting: *Deleted

## 2019-02-26 NOTE — Telephone Encounter (Signed)
Pt insurance is active and benefits verified through [facility] $30.00, DED 0/0 met, out of pocket $5,500/$2,427.61 met, co-insurance 0%. no pre-authorization required. Passport, 02/26/2019 @ 1:42pm, REF# [id number]  Will contact patient to see if she is interested in the Cardiac Rehab Program. If interested, patient will need to complete follow up appt. Once completed, patient will be contacted for scheduling upon review by the RN Navigator.

## 2019-02-26 NOTE — Telephone Encounter (Signed)
Called and left message for pt regarding referral to Cardiac Rehab.  Contact information provided. Cherre Huger, BSN Cardiac and Training and development officer

## 2019-03-31 ENCOUNTER — Telehealth (HOSPITAL_COMMUNITY): Payer: Self-pay

## 2019-03-31 ENCOUNTER — Telehealth (HOSPITAL_COMMUNITY): Payer: Self-pay | Admitting: *Deleted

## 2019-03-31 NOTE — Telephone Encounter (Signed)
Called and left message for pt regarding referral to CR and advisement of Cardiac Rehab continued closure due to Covid-19 in adherence of national recommendation in group settings.  Requested call back for interest in participating and any needs for educational resources for exercise, diet and heart failure guidelines. Since  we have been unable to reach pt by phone since receiving the referral on 02/25/19, will send please contact letter. Cherre Huger, BSN Cardiac and Training and development officer

## 2019-04-13 ENCOUNTER — Telehealth (HOSPITAL_COMMUNITY): Payer: Self-pay

## 2019-04-13 NOTE — Telephone Encounter (Signed)
Received dup. Request for medical records for disability. records were already faxed 03/16/2019

## 2019-04-15 ENCOUNTER — Encounter (HOSPITAL_COMMUNITY): Payer: Federal, State, Local not specified - PPO | Admitting: Cardiology

## 2019-04-19 ENCOUNTER — Ambulatory Visit (HOSPITAL_COMMUNITY)
Admission: RE | Admit: 2019-04-19 | Discharge: 2019-04-19 | Disposition: A | Discharge status: Home / Self Care | Payer: Federal, State, Local not specified - PPO | Source: Ambulatory Visit | Attending: Cardiology | Admitting: Cardiology

## 2019-04-19 ENCOUNTER — Encounter (HOSPITAL_COMMUNITY): Payer: Self-pay

## 2019-04-19 ENCOUNTER — Other Ambulatory Visit: Payer: Self-pay

## 2019-04-19 VITALS — BP 156/85 | HR 87 | Wt 185.0 lb

## 2019-04-19 DIAGNOSIS — I5022 Chronic systolic (congestive) heart failure: Secondary | ICD-10-CM

## 2019-04-19 DIAGNOSIS — E875 Hyperkalemia: Secondary | ICD-10-CM

## 2019-04-19 DIAGNOSIS — I10 Essential (primary) hypertension: Secondary | ICD-10-CM

## 2019-04-19 DIAGNOSIS — R002 Palpitations: Secondary | ICD-10-CM

## 2019-04-19 DIAGNOSIS — I428 Other cardiomyopathies: Secondary | ICD-10-CM

## 2019-04-19 MED ORDER — CARVEDILOL 25 MG PO TABS: 25.0000 mg | ORAL_TABLET | Freq: Two times a day (BID) | ORAL | 6 refills | Status: DC

## 2019-04-19 NOTE — Patient Instructions (Signed)
INCREASE Coreg to 25mg  (1 tab) twice a day.   Your physician recommends that you schedule a follow-up appointment in: 3-4 months with Dr. Aundra Dubin. You will repeat labs at visit.

## 2019-04-19 NOTE — Progress Notes (Signed)
LM on patient VM.  Called to review AVS.  Information also sent via mychart.       Increase coreg 25 mg twice a day.   Recommended follow-up: Follow up 3-4 months with Dr Aundra Dubin. Repeat BMET at that time. Marland Kitchen

## 2019-04-19 NOTE — Addendum Note (Signed)
Encounter addended by: Valeda Malm, RN on: 04/19/2019 11:09 AM  Actions taken: Order list changed, Clinical Note Signed

## 2019-04-19 NOTE — Progress Notes (Signed)
Heart Failure TeleHealth Note  Due to national recommendations of social distancing due to [place] 19, Audio/video telehealth visit is felt to be most appropriate for this patient at this time.  See MyChart message from today for patient consent regarding telehealth for [facility].  Date:  04/19/2019   ID:  Jordan [name], DOB Sep 16, 1961, MRN [id number]  Location: Home  Provider location: [place] [facility] Type of Visit: Established patient   PCP:  Marda Stalker, PA-C  Cardiologist:  Sinclair Grooms, MD Primary HF: Dr Aundra Dubin   Chief Complaint: Heart Failure   History of Present Illness: Jordan [name] is a 58 y.o. female with a history of  chronic systolic CHF.    She initially developed dyspnea in 11/18. She had several rounds of antibiotics for presumed bronchitis.  She did not get better, and ended up hospitalized in 3/19 where she was found to have acute systolic CHF.  Echo in 3/19 showed EF 15% with moderate-severe MR.  She was diuresed, and RHC/LHC was done => no coronary disease, low but not markedly low cardiac output.  Since then, she has had another echo in 7/19 that showed EF 25-30% and mild MR.   She  presents via Engineer, civil (consulting) for a telehealth visit today.   Last visit entresto was increased to 97-103 twice a day. Overall feeling fine. Complaining of palpitations. Having problems with her balance. Having some dizziness and headache.  Denies PND/Orthopnea. Appetite ok. Able to walk around the block 20 minuetes 3 times a week. . No fever or chills. Weight at home 185  pounds. Taking all medications  she denies symptoms worrisome for COVID 19.   Labs (8/19): pro-BNP 746, hgb 13.7, K 4.9, creatinine 1.01 Labs (9/19): ANA negative, myeloma panel negative, TSH normal, Fe studies normal.  Labs (10/19): K 4.3, creatinine 0.87 Labs (1/20): K 4.3, creatinine 0.88 Labs (02/19/19): K 4.2 Creatinine 0.97   PMH:  1. Hyperlipidemia 2. Left  TKR 3. HTN 4. Chronic systolic CHF: Nonischemic cardiomyopathy.  - Echo (3/19): EF 15%, mild LV dilation, mild LVH, moderate-severe mitral regurgitation, PASP 34 mmHg.  - LHC/RHC (4/19): No CAD.  Mean RA 4, PA 31/14, mean PCWP 12, CI 2.1 Fick/2.1 Thermo.   - Echo (7/19): EF 25-30%, severe LV dilation, mild MR.  - CPX (2/20): RER 1.3, VE/VCO2 27, VO2 15.8 => no significant HF limitation, appears to be limited mainly due to deconditioning. - CMRI 02/2019 EF 48% . No evidence of infiltrative disease.   5. Palpitations: Holter (9/19) with occasional PVCs.    Past Medical History:  Diagnosis Date  . Arthritis   . Arthritis of knee, left 08/25/2013  . Cardiomegaly 03/02/2018  . Hypertension   . Hyponatremia 08/26/2013  . MVP (mitral valve prolapse)   . Pre-diabetes   . Tobacco abuse    Past Surgical History:  Procedure Laterality Date  . ABDOMINAL HYSTERECTOMY  94  . APPENDECTOMY    . CERVICAL DISCECTOMY  08  . KNEE ARTHROSCOPY Right 09,and ?  . RIGHT/LEFT HEART CATH AND CORONARY ANGIOGRAPHY N/A 03/09/2018   Procedure: RIGHT/LEFT HEART CATH AND CORONARY ANGIOGRAPHY;  Surgeon: Nelva Bush, MD;  Location: [facility];  Service: Cardiovascular;  Laterality: N/A;  . SHOULDER ARTHROSCOPY Left    bone spur  . TOTAL KNEE ARTHROPLASTY Right 08/25/2013   Procedure: TOTAL KNEE ARTHROPLASTY;  Surgeon: Kerin Salen, MD;  Location: [facility];  Service: Orthopedics;  Laterality: Right;  Current Outpatient Medications  Medication Sig Dispense Refill  . albuterol (PROVENTIL HFA;VENTOLIN HFA) 108 (90 Base) MCG/ACT inhaler Inhale 2 puffs into the lungs every 6 (six) hours as needed for wheezing or shortness of breath.  2  . amoxicillin (AMOXIL) 500 MG tablet Take 4 tablets 30-60 minutes before procedure 4 tablet 0  . aspirin EC 81 MG tablet Take 81 mg by mouth daily.    Marland Kitchen CALCIUM PO Take 1 tablet by mouth daily.    . carvedilol (COREG) 12.5 MG tablet Take 1.5 tablets (18.75 mg total) by mouth  2 (two) times daily. 90 tablet 5  . clonazePAM (KLONOPIN) 0.5 MG tablet Take 0.5 mg by mouth at bedtime.    . furosemide (LASIX) 40 MG tablet Take 1 tablet (40 mg total) by mouth daily. 90 tablet 3  . ipratropium (ATROVENT) 0.06 % nasal spray Place 2 sprays into both nostrils 2 (two) times daily.  0  . LOKELMA 10 g PACK packet TAKE 10 G BY MOUTH DAILY. 30 packet 3  . metFORMIN (GLUCOPHAGE-XR) 500 MG 24 hr tablet Take 500 mg by mouth daily.     . Multiple Vitamins-Minerals (MULTIVITAMIN WITH MINERALS) tablet Take 1 tablet by mouth daily.    . pravastatin (PRAVACHOL) 40 MG tablet Take 40 mg by mouth daily.    . sacubitril-valsartan (ENTRESTO) 97-103 MG Take 1 tablet by mouth 2 (two) times daily. 60 tablet 3  . spironolactone (ALDACTONE) 25 MG tablet Take 0.5 tablets (12.5 mg total) by mouth daily for 15 days. 90 tablet 3  . Venlafaxine HCl 225 MG TB24 Take 225 mg by mouth daily. For hot flashes  1   No current facility-administered medications for this encounter.     Allergies:   Guaifenesin & derivatives and Naprosyn [naproxen]   Social History:  The patient  reports that she quit smoking about a year ago. Her smoking use included cigarettes. She has a 20.00 pack-year smoking history. She has never used smokeless tobacco. She reports current alcohol use of about 1.0 standard drinks of alcohol per week. She reports that she does not use drugs.   Family History:  The patient's family history includes CAD in her father; Diabetes in her brother and father; Hypertension in her father; Liver disease in her sister.   ROS:  Please see the history of present illness.   All other systems are personally reviewed and negative.  Today's Vitals   04/19/19 1041  BP: (!) 156/85  Pulse: 87  Weight: 83.9 kg (185 lb)   Body mass index is 29.86 kg/m.  Exam:  Tele Health Call; Exam is subjective  General:  Speaks in full sentences. No resp difficulty. Lungs: Normal respiratory effort with conversation.   Abdomen: Non-distended per patient report Extremities: Pt denies edema. Neuro: Alert & oriented x 3.   Recent Labs: 07/31/2018: Hemoglobin 13.7; NT-Pro BNP 746; Platelets 232 08/20/2018: TSH 2.099 02/19/2019: BUN 14; Creatinine, Ser 0.97; Potassium 4.2; Sodium 140  Personally reviewed   Wt Readings from Last 3 Encounters:  04/19/19 83.9 kg (185 lb)  02/09/19 85.1 kg (187 lb 9.6 oz)  12/29/18 84.4 kg (186 lb)      ASSESSMENT AND PLAN:   1. Chronic systolic CHF: Nonischemic cardiomyopathy.  Last echo in 7/19 with some improvement, EF 25-30%, up from 15% in 3/19.  Cath in 3/19 with no coronary disease.  Symptoms began in 11/19, initially seemed like URI.  Possible viral myocarditis.  No history of ETOH or drugs.  No  FH of nonischemic cardiomyopathy.  CPX in 2/20 suggested minimal HF limitation, primarily limited by deconditioning.   - CMRI  02/2019 EF 48% no evidence of infiltrative disease. Out range for ICD - NYHA  II-IIIb. Volume status sounds stable.  - Increase coreg 25 mg twice a day.  - Continue Entresto to 97/103 bid - Continue spironolactone 12.5 mg daily. Continue Lokelma. -Continue lasix 40 mg daily.  - 2. HTN: Increasing Entresto as above.  3. Palpitations: Holter in 9/19 with occasional PVCs. Increase carvedilol 25 mg twice a day.  4. Hyperkalemia: She is now on Lokelma and has been able to tolerate spironolactone.  5. Deconditioning: Continue to increase activity daily.   COVID screen The patient does not have any symptoms that suggest any further testing/ screening at this time.  Social distancing reinforced today.  Patient Risk: After full review of this patients clinical status, I feel that they are at moderate risk for cardiac decompensation at this time.  Relevant cardiac medications were reviewed at length with the patient today. The patient does not have concerns regarding their medications at this time.   The following changes were made today:  Increase coreg 25  mg twice a dya.  Recommended follow-up:  Follow up 3-4 months with Dr Aundra Dubin. Repeat BMET at that time. .   Today, I have spent 15  minutes with the patient with telehealth technology discussing the above issues .    Jeanmarie Hubert, NP  04/19/2019 10:43 AM  [place] [address] [phone] (office) [phone] (fax)

## 2019-04-22 ENCOUNTER — Other Ambulatory Visit (HOSPITAL_COMMUNITY): Payer: Self-pay | Admitting: Cardiology

## 2019-04-22 DIAGNOSIS — H938X2 Other specified disorders of left ear: Secondary | ICD-10-CM | POA: Diagnosis not present

## 2019-04-22 DIAGNOSIS — H6122 Impacted cerumen, left ear: Secondary | ICD-10-CM | POA: Diagnosis not present

## 2019-05-02 ENCOUNTER — Other Ambulatory Visit (HOSPITAL_COMMUNITY): Payer: Self-pay | Admitting: Cardiology

## 2019-05-26 ENCOUNTER — Telehealth (HOSPITAL_COMMUNITY): Payer: Self-pay | Admitting: *Deleted

## 2019-05-27 ENCOUNTER — Other Ambulatory Visit (HOSPITAL_COMMUNITY): Payer: Self-pay | Admitting: Cardiology

## 2019-07-20 ENCOUNTER — Ambulatory Visit (HOSPITAL_COMMUNITY)
Admission: RE | Admit: 2019-07-20 | Discharge: 2019-07-20 | Disposition: A | Discharge status: Home / Self Care | Payer: Federal, State, Local not specified - PPO | Source: Ambulatory Visit | Attending: Cardiology | Admitting: Cardiology

## 2019-07-20 ENCOUNTER — Encounter (HOSPITAL_COMMUNITY): Payer: Self-pay | Admitting: Cardiology

## 2019-07-20 ENCOUNTER — Other Ambulatory Visit: Payer: Self-pay

## 2019-07-20 VITALS — BP 140/78 | HR 90 | Wt 189.0 lb

## 2019-07-20 DIAGNOSIS — Z8249 Family history of ischemic heart disease and other diseases of the circulatory system: Secondary | ICD-10-CM | POA: Diagnosis not present

## 2019-07-20 DIAGNOSIS — E875 Hyperkalemia: Secondary | ICD-10-CM | POA: Diagnosis not present

## 2019-07-20 DIAGNOSIS — I428 Other cardiomyopathies: Secondary | ICD-10-CM | POA: Diagnosis not present

## 2019-07-20 DIAGNOSIS — I5022 Chronic systolic (congestive) heart failure: Secondary | ICD-10-CM | POA: Diagnosis not present

## 2019-07-20 DIAGNOSIS — R5383 Other fatigue: Secondary | ICD-10-CM | POA: Insufficient documentation

## 2019-07-20 DIAGNOSIS — R002 Palpitations: Secondary | ICD-10-CM | POA: Insufficient documentation

## 2019-07-20 DIAGNOSIS — Z96652 Presence of left artificial knee joint: Secondary | ICD-10-CM | POA: Diagnosis not present

## 2019-07-20 DIAGNOSIS — I110 Hypertensive heart disease with heart failure: Secondary | ICD-10-CM | POA: Insufficient documentation

## 2019-07-20 DIAGNOSIS — Z79899 Other long term (current) drug therapy: Secondary | ICD-10-CM | POA: Insufficient documentation

## 2019-07-20 DIAGNOSIS — E785 Hyperlipidemia, unspecified: Secondary | ICD-10-CM | POA: Diagnosis not present

## 2019-07-20 DIAGNOSIS — Z7982 Long term (current) use of aspirin: Secondary | ICD-10-CM | POA: Insufficient documentation

## 2019-07-20 LAB — BASIC METABOLIC PANEL
Anion gap: 11 (ref 5–15)
BUN: 12 mg/dL (ref 6–20)
CO2: 23 mmol/L (ref 22–32)
Calcium: 9.7 mg/dL (ref 8.9–10.3)
Chloride: 106 mmol/L (ref 98–111)
Creatinine, Ser: 1.06 mg/dL — ABNORMAL HIGH (ref 0.44–1.00)
GFR calc Af Amer: >60 mL/min (ref >60)
GFR calc non Af Amer: 58 mL/min — ABNORMAL LOW (ref >60)
Glucose, Bld: 133 mg/dL — ABNORMAL HIGH (ref 70–99)
Potassium: 4.6 mmol/L (ref 3.5–5.1)
Sodium: 140 mmol/L (ref 135–145)

## 2019-07-20 LAB — CBC
HCT: 43.5 % (ref 36.0–46.0)
Hemoglobin: 14.1 g/dL (ref 12.0–15.0)
MCH: 32.1 pg (ref 26.0–34.0)
MCHC: 32.4 g/dL (ref 30.0–36.0)
MCV: 99.1 fL (ref 80.0–100.0)
Platelets: 229 10*3/uL (ref 150–400)
RBC: 4.39 MIL/uL (ref 3.87–5.11)
RDW: 12.2 % (ref 11.5–15.5)
WBC: 7.4 10*3/uL (ref 4.0–10.5)
nRBC: 0 % (ref 0.0–0.2)

## 2019-07-20 LAB — TSH: TSH: 1.855 u[IU]/mL (ref 0.350–4.500)

## 2019-07-20 NOTE — Progress Notes (Signed)
PCP: Marda Stalker, PA-C Cardiology: Dr. Tamala Julian HF Cardiology: Dr. Aundra Dubin  58 y.o. with history of chronic systolic CHF was referred by Dr. Caryl Comes for CHF evaluation.   She initially developed dyspnea in 11/18. She had several rounds of antibiotics for presumed bronchitis.  She did not get better, and ended up hospitalized in 3/19 where she was found to have acute systolic CHF.  Echo in 3/19 showed EF 15% with moderate-severe MR.  She was diuresed, and RHC/LHC was done => no coronary disease, low but not markedly low cardiac output.  Since then, she has had another echo in 7/19 that showed EF 25-30% and mild MR.   Cardiac MRI in 3/20 showed LV EF 46%, RV EF 48%, no LGE.    She presents today for followup of CHF.  She is able to walk 1 block without trouble (walks dog this distance twice a day).  She swims laps for about 20 minutes several times a week. No significant dyspnea, but she tires/fatigues easily. No snoring or daytime sleepiness. She feels like her balance is off but she denies lightheadedness.    ECG (personally reviewed): NSR, LVH  Labs (8/19): pro-BNP 746, hgb 13.7, K 4.9, creatinine 1.01 Labs (9/19): ANA negative, myeloma panel negative, TSH normal, Fe studies normal.  Labs (10/19): K 4.3, creatinine 0.87 Labs (1/20): K 4.3, creatinine 0.88 Labs (3/20): K 4.2, creatinine 0.97  PMH:  1. Hyperlipidemia 2. Left TKR 3. HTN 4. Chronic systolic CHF: Nonischemic cardiomyopathy.  - Echo (3/19): EF 15%, mild LV dilation, mild LVH, moderate-severe mitral regurgitation, PASP 34 mmHg.  - LHC/RHC (4/19): No CAD.  Mean RA 4, PA 31/14, mean PCWP 12, CI 2.1 Fick/2.1 Thermo.   - Echo (7/19): EF 25-30%, severe LV dilation, mild MR.  - CPX (2/20): RER 1.3, VE/VCO2 27, VO2 15.8 => no significant HF limitation, appears to be limited mainly due to deconditioning.  - Cardiac MRI (3/20): LV EF 46%, RV EF 48%, no LGE.  5. Palpitations: Holter (9/19) with occasional PVCs.   SH: Married, retired  from working at [employer], nonsmoker, no ETOH, no drugs.   FH: Father with CABG in 67s.  No history of nonischemic cardiomyopathy.   ROS: All systems reviewed and negative except as per HPI.   Current Outpatient Medications  Medication Sig Dispense Refill  . amoxicillin (AMOXIL) 500 MG tablet Take 4 tablets 30-60 minutes before procedure 4 tablet 0  . aspirin EC 81 MG tablet Take 81 mg by mouth daily.    Marland Kitchen CALCIUM PO Take 1 tablet by mouth daily.    . carvedilol (COREG) 25 MG tablet Take 1 tablet (25 mg total) by mouth 2 (two) times daily. 60 tablet 6  . clonazePAM (KLONOPIN) 0.5 MG tablet Take 0.5 mg by mouth at bedtime.    Marland Kitchen ENTRESTO 97-103 MG TAKE 1 TABLET BY MOUTH TWICE A DAY 60 tablet 3  . furosemide (LASIX) 40 MG tablet Take 1 tablet (40 mg total) by mouth daily. 90 tablet 3  . ipratropium (ATROVENT) 0.06 % nasal spray Place 2 sprays into both nostrils 2 (two) times daily.  0  . LOKELMA 10 g PACK packet TAKE 10 G BY MOUTH DAILY. 30 packet 3  . Multiple Vitamins-Minerals (MULTIVITAMIN WITH MINERALS) tablet Take 1 tablet by mouth daily.    . pravastatin (PRAVACHOL) 40 MG tablet Take 40 mg by mouth daily.    Marland Kitchen spironolactone (ALDACTONE) 25 MG tablet Take 0.5 tablets (12.5 mg total) by mouth daily for  15 days. 90 tablet 3  . Venlafaxine HCl 225 MG TB24 Take 225 mg by mouth daily. For hot flashes  1   No current facility-administered medications for this encounter.    BP 140/78   Pulse 90   Wt 85.7 kg (189 lb)   SpO2 95%   BMI 30.51 kg/m  General: NAD Neck: No JVD, no thyromegaly or thyroid nodule.  Lungs: Clear to auscultation bilaterally with normal respiratory effort. CV: Nondisplaced PMI.  Heart regular S1/S2, no S3/S4, no murmur.  No peripheral edema.  No carotid bruit.  Normal pedal pulses.  Abdomen: Soft, nontender, no hepatosplenomegaly, no distention.  Skin: Intact without lesions or rashes.  Neurologic: Alert and oriented x 3.  Psych: Normal affect. Extremities: No  clubbing or cyanosis.  HEENT: Normal.   Assessment/Plan: 1. Chronic systolic CHF: Nonischemic cardiomyopathy.  Last echo in 7/19 with some improvement, EF 25-30%, up from 15% in 3/19.  Cath in 3/19 with no coronary disease.  Symptoms began in 11/19, initially seemed like URI.  Possible viral myocarditis.  No history of ETOH or drugs.  No FH of nonischemic cardiomyopathy.  CPX in 2/20 suggested minimal HF limitation, primarily limited by deconditioning.  Cardiac MRI in 3/20 showed normal RV function, LV EF 46%, no delayed enhancement.  She is out of EF range for ICD.  She has NYHA class II symptoms, not volume overloaded on exam.   - Continue Coreg 25 mg bid.   - Continue Entresto 97/103 bid.   - Continue spironolactone 12.5 mg daily. Continue Lokelma. I will not increase spironolactone today as we have not checked K recently. She will need BMET today.  - Continue Lasix 40 mg daily.  2. HTN: BP reasonably controlled.  3. Palpitations: Minimal.  Holter in 9/19 with occasional PVCs.  4. Hyperkalemia: She is now on Lokelma and has been able to tolerate spironolactone.  - BMET today.  5. Fatigue: She does not screen positive for OSA.   - Check CBC and TSH.    Followup in 3 months.    Loralie Champagne 07/20/2019

## 2019-07-20 NOTE — Patient Instructions (Addendum)
No medication changes today!  Labs today  We will only contact you if something comes back abnormal or we need to make some changes. Otherwise no news is good news!  Your physician recommends that you schedule a follow-up appointment in: 3 months with Dr Aundra Dubin   At the [facility], you and your health needs are our priority. As part of our continuing mission to provide you with exceptional heart care, we have created designated Provider Care Teams. These Care Teams include your primary Cardiologist (physician) and Advanced Practice Providers (APPs- Physician Assistants and Nurse Practitioners) who all work together to provide you with the care you need, when you need it.   You may see any of the following providers on your designated Care Team at your next follow up: Marland Kitchen Dr Glori Bickers . Dr Loralie Champagne . Darrick Grinder, NP   Please be sure to bring in all your medications bottles to every appointment.

## 2019-08-15 ENCOUNTER — Other Ambulatory Visit (HOSPITAL_COMMUNITY): Payer: Self-pay | Admitting: Cardiology

## 2019-08-18 ENCOUNTER — Other Ambulatory Visit: Payer: Self-pay | Admitting: Cardiology

## 2019-08-18 DIAGNOSIS — I5022 Chronic systolic (congestive) heart failure: Secondary | ICD-10-CM

## 2019-08-20 ENCOUNTER — Telehealth (HOSPITAL_COMMUNITY): Payer: Self-pay

## 2019-08-20 NOTE — Telephone Encounter (Signed)
PA for [facility] submitted via CMM to [place].

## 2019-08-24 ENCOUNTER — Telehealth (HOSPITAL_COMMUNITY): Payer: Self-pay | Admitting: Pharmacist

## 2019-08-24 NOTE — Telephone Encounter (Signed)
Advanced Heart Failure Patient Advocate Encounter  Prior Authorization for Jordan [name] has been approved.    Effective dates: 07/21/2019 through 08/19/2020  Patients co-pay is $0.00. Patient is aware.  Audry Riles, PharmD, BCPS, CPP Heart Failure Clinic Pharmacist [phone]

## 2019-09-14 ENCOUNTER — Other Ambulatory Visit (HOSPITAL_COMMUNITY): Payer: Self-pay | Admitting: Cardiology

## 2019-10-04 DIAGNOSIS — Z1211 Encounter for screening for malignant neoplasm of colon: Secondary | ICD-10-CM | POA: Diagnosis not present

## 2019-10-04 DIAGNOSIS — E119 Type 2 diabetes mellitus without complications: Secondary | ICD-10-CM | POA: Diagnosis not present

## 2019-10-04 DIAGNOSIS — E781 Pure hyperglyceridemia: Secondary | ICD-10-CM | POA: Diagnosis not present

## 2019-10-04 DIAGNOSIS — Z23 Encounter for immunization: Secondary | ICD-10-CM | POA: Diagnosis not present

## 2019-10-04 DIAGNOSIS — Z0000 Encounter for general adult medical examination without abnormal findings: Secondary | ICD-10-CM | POA: Diagnosis not present

## 2019-10-04 DIAGNOSIS — Z6831 Body mass index (BMI) 31.0-31.9, adult: Secondary | ICD-10-CM | POA: Diagnosis not present

## 2019-10-09 ENCOUNTER — Other Ambulatory Visit (HOSPITAL_COMMUNITY): Payer: Self-pay | Admitting: Adult Health

## 2019-10-19 ENCOUNTER — Telehealth (HOSPITAL_COMMUNITY): Payer: Self-pay

## 2019-10-19 NOTE — Telephone Encounter (Signed)
Spoke with patient, advised of need to change appt time on Friday to 4pm. Pt ok with change.  She also notes her primary MD increased her pravastatin to 80mg  and started her on Metformin. Med changes/additions noted.

## 2019-10-22 ENCOUNTER — Ambulatory Visit (HOSPITAL_COMMUNITY)
Admission: RE | Admit: 2019-10-22 | Discharge: 2019-10-22 | Disposition: A | Discharge status: Home / Self Care | Payer: Federal, State, Local not specified - PPO | Source: Ambulatory Visit | Attending: Cardiology | Admitting: Cardiology

## 2019-10-22 ENCOUNTER — Telehealth (HOSPITAL_COMMUNITY): Payer: Self-pay

## 2019-10-22 ENCOUNTER — Other Ambulatory Visit: Payer: Self-pay

## 2019-10-22 ENCOUNTER — Telehealth (HOSPITAL_COMMUNITY): Payer: Federal, State, Local not specified - PPO | Admitting: Cardiology

## 2019-10-22 DIAGNOSIS — I5022 Chronic systolic (congestive) heart failure: Secondary | ICD-10-CM

## 2019-10-22 MED ORDER — SPIRONOLACTONE 25 MG PO TABS: 25.0000 mg | ORAL_TABLET | Freq: Every day | ORAL | 3 refills | Status: DC

## 2019-10-22 NOTE — Telephone Encounter (Signed)
Tried calling patient to go over post virtual visit info but she did not answer. Left message to return call. Rx was updated and sent in. Echo and lab orders were placed.   I also fwd all appointments for scheduling to [employer].

## 2019-10-22 NOTE — Patient Instructions (Addendum)
INCREASE Spirolactone 25mg (1 tab) by mouth once daily.   Your physician recommends that you schedule a follow-up appointment in: 10 days for a lab only appointment and in march for a follow up with Dr. Aundra Dubin with an Echo prior to your appointment.   Your physician has requested that you have an echocardiogram. Echocardiography is a painless test that uses sound waves to create images of your heart. It provides your doctor with information about the size and shape of your heart and how well your heart's chambers and valves are working. This procedure takes approximately one hour. There are no restrictions for this procedure.  At the [facility], you and your health needs are our priority. As part of our continuing mission to provide you with exceptional heart care, we have created designated Provider Care Teams. These Care Teams include your primary Cardiologist (physician) and Advanced Practice Providers (APPs- Physician Assistants and Nurse Practitioners) who all work together to provide you with the care you need, when you need it.   You may see any of the following providers on your designated Care Team at your next follow up: Marland Kitchen Dr Glori Bickers . Dr Loralie Champagne . Darrick Grinder, NP . Lyda Jester, PA   Please be sure to bring in all your medications bottles to every appointment.

## 2019-10-22 NOTE — Progress Notes (Signed)
Heart Failure TeleHealth Note  Due to national recommendations of social distancing due to [place] 19, Audio/video telehealth visit is felt to be most appropriate for this patient at this time.  See MyChart message from today for patient consent regarding telehealth for [facility].  Date:  10/22/2019   ID:  [name], DOB [birth year], MRN [id number]  Location: Home  Provider location: [place] [facility] Type of Visit: Established patient   PCP:  Marda Stalker, PA-C  Cardiologist:  Sinclair Grooms, MD HF Cardiology: Dr. Aundra Dubin  Chief Complaint: Shortness of breath   History of Present Illness: Jordan [name] is a 58 y.o. female who presents via audio/video conferencing for a telehealth visit today for followup of CHF.     She denies symptoms worrisome for COVID 19.   She initially developed dyspnea in 11/18. She had several rounds of antibiotics for presumed bronchitis.  She did not get better, and ended up hospitalized in 3/19 where she was found to have acute systolic CHF.  Echo in 3/19 showed EF 15% with moderate-severe MR.  She was diuresed, and RHC/LHC was done => no coronary disease, low but not markedly low cardiac output.  Since then, she has had another echo in 7/19 that showed EF 25-30% and mild MR.   Cardiac MRI in 3/20 showed LV EF 46%, RV EF 48%, no LGE.    She seems to be doing well. She is short of breath walking up a hill or incline.  No dyspnea walking on flat ground.  No orthopnea/PND.  No chest pain.  No lightheadedness.  Occasional palpitations.    Labs (8/19): pro-BNP 746, hgb 13.7, K 4.9, creatinine 1.01 Labs (9/19): ANA negative, myeloma panel negative, TSH normal, Fe studies normal.  Labs (10/19): K 4.3, creatinine 0.87 Labs (1/20): K 4.3, creatinine 0.88 Labs (3/20): K 4.2, creatinine 0.97 Labs (8/20): K 4.6, creatinine 1.06, TSH normal, hgb 14.1  PMH:  1. Hyperlipidemia 2. Left TKR 3. HTN 4. Chronic systolic CHF: Nonischemic  cardiomyopathy.  - Echo (3/19): EF 15%, mild LV dilation, mild LVH, moderate-severe mitral regurgitation, PASP 34 mmHg.  - LHC/RHC (4/19): No CAD.  Mean RA 4, PA 31/14, mean PCWP 12, CI 2.1 Fick/2.1 Thermo.   - Echo (7/19): EF 25-30%, severe LV dilation, mild MR.  - CPX (2/20): RER 1.3, VE/VCO2 27, VO2 15.8 => no significant HF limitation, appears to be limited mainly due to deconditioning.  - Cardiac MRI (3/20): LV EF 46%, RV EF 48%, no LGE.  5. Palpitations: Holter (9/19) with occasional PVCs.  6. Type II diabetes  SH: Married, retired from working at [employer], nonsmoker, no ETOH, no drugs.   FH: Father with CABG in 18s.  No history of nonischemic cardiomyopathy.   ROS: All systems reviewed and negative except as per HPI.   Current Outpatient Medications  Medication Sig Dispense Refill  . amoxicillin (AMOXIL) 500 MG tablet Take 4 tablets 30-60 minutes before procedure 4 tablet 0  . aspirin EC 81 MG tablet Take 81 mg by mouth daily.    Marland Kitchen CALCIUM PO Take 1 tablet by mouth daily.    . carvedilol (COREG) 25 MG tablet TAKE 1 TABLET BY MOUTH TWICE A DAY 180 tablet 2  . clonazePAM (KLONOPIN) 0.5 MG tablet Take 0.5 mg by mouth at bedtime.    Marland Kitchen ENTRESTO 97-103 MG TAKE 1 TABLET BY MOUTH TWICE A DAY 60 tablet 3  . furosemide (LASIX) 40 MG tablet TAKE 1  TABLET BY MOUTH EVERY DAY 90 tablet 1  . ipratropium (ATROVENT) 0.06 % nasal spray Place 2 sprays into both nostrils 2 (two) times daily.  0  . LOKELMA 10 g PACK packet TAKE 10G (1 PACKET) BY MOUTH DAILY. 30 packet 3  . metFORMIN (GLUCOPHAGE) 500 MG tablet Take 500 mg by mouth daily with breakfast.    . Multiple Vitamins-Minerals (MULTIVITAMIN WITH MINERALS) tablet Take 1 tablet by mouth daily.    . pravastatin (PRAVACHOL) 80 MG tablet Take 80 mg by mouth daily.    Marland Kitchen spironolactone (ALDACTONE) 25 MG tablet Take 1 tablet (25 mg total) by mouth daily. 90 tablet 3  . Venlafaxine HCl 225 MG TB24 Take 225 mg by mouth daily. For hot flashes  1    No current facility-administered medications for this encounter.    (Video/Tele Health Call; Exam is subjective and or/visual.) General:  Speaks in full sentences. No resp difficulty. Lungs: Normal respiratory effort with conversation.  Abdomen: Non-distended per patient report Extremities: Pt denies edema. Neuro: Alert & oriented x 3.   Assessment/Plan: 1. Chronic systolic CHF: Nonischemic cardiomyopathy.  Last echo in 7/19 with some improvement, EF 25-30%, up from 15% in 3/19.  Cath in 3/19 with no coronary disease.  Symptoms began in 11/19, initially seemed like URI.  Possible viral myocarditis.  No history of ETOH or drugs.  No FH of nonischemic cardiomyopathy.  CPX in 2/20 suggested minimal HF limitation, primarily limited by deconditioning.  Cardiac MRI in 3/20 showed normal RV function, LV EF 46%, no delayed enhancement.  She is out of EF range for ICD.  She has NYHA class II symptoms, weight has been stable.    - Continue Coreg 25 mg bid.   - Continue Entresto 97/103 bid.   - K has been stable on Lokelma, I will increase spironolactone to 25 mg daily.  BMET in 10 days and continue Lokelma.  - Continue Lasix 40 mg daily.  - Repeat echo in 1 year at 3/21.  2. HTN: BP reasonably controlled.  3. Palpitations: Occasional.  Holter in 9/19 with occasional PVCs.  4. Hyperkalemia: She is now on Lokelma and has been able to tolerate spironolactone.  I will increase spironolactone as above and carefully follow BMET.   COVID screen The patient does not have any symptoms that suggest any further testing/ screening at this time.  Social distancing reinforced today.  Patient Risk: After full review of this patients clinical status, I feel that they are at moderate risk for cardiac decompensation at this time.  Relevant cardiac medications were reviewed at length with the patient today. The patient does not have concerns regarding their medications at this time.   Recommended follow-up:  In  3/21 with echo.   Today, I have spent 17 minutes with the patient with telehealth technology discussing the above issues .    Signed, Loralie Champagne, MD  10/22/2019 4:22 PM

## 2019-10-22 NOTE — Telephone Encounter (Signed)
-----   Message from Larey Dresser, MD sent at 10/22/2019  3:03 PM EST ----- 1. Increase spironolactone to 25 mg daily.  2. Come in 10 days for BMET, Mg level.  3. Followup in 3/21 with visit + echo.

## 2019-10-26 ENCOUNTER — Telehealth (HOSPITAL_COMMUNITY): Payer: Self-pay | Admitting: Vascular Surgery

## 2019-10-26 NOTE — Telephone Encounter (Signed)
Left pt message to make 10 day lab appt from 11/13 appt / 4 month f/u w/ echo w/ [facility]

## 2019-11-01 ENCOUNTER — Other Ambulatory Visit: Payer: Self-pay

## 2019-11-01 ENCOUNTER — Ambulatory Visit (HOSPITAL_COMMUNITY)
Admission: RE | Admit: 2019-11-01 | Discharge: 2019-11-01 | Disposition: A | Discharge status: Home / Self Care | Payer: Federal, State, Local not specified - PPO | Source: Ambulatory Visit | Attending: Cardiology | Admitting: Cardiology

## 2019-11-01 DIAGNOSIS — I5022 Chronic systolic (congestive) heart failure: Secondary | ICD-10-CM

## 2019-11-01 LAB — BASIC METABOLIC PANEL
Anion gap: 8 (ref 5–15)
BUN: 11 mg/dL (ref 6–20)
CO2: 28 mmol/L (ref 22–32)
Calcium: 9.6 mg/dL (ref 8.9–10.3)
Chloride: 102 mmol/L (ref 98–111)
Creatinine, Ser: 0.91 mg/dL (ref 0.44–1.00)
GFR calc Af Amer: >60 mL/min (ref >60)
GFR calc non Af Amer: >60 mL/min (ref >60)
Glucose, Bld: 144 mg/dL — ABNORMAL HIGH (ref 70–99)
Potassium: 3.6 mmol/L (ref 3.5–5.1)
Sodium: 138 mmol/L (ref 135–145)

## 2019-11-01 LAB — MAGNESIUM: Magnesium: 2.3 mg/dL (ref 1.7–2.4)

## 2019-11-19 ENCOUNTER — Other Ambulatory Visit: Payer: Self-pay | Admitting: Family Medicine

## 2019-11-19 DIAGNOSIS — Z1231 Encounter for screening mammogram for malignant neoplasm of breast: Secondary | ICD-10-CM

## 2019-11-23 ENCOUNTER — Ambulatory Visit: Payer: Federal, State, Local not specified - PPO | Admitting: Neurology

## 2019-11-23 ENCOUNTER — Other Ambulatory Visit (HOSPITAL_COMMUNITY): Payer: Self-pay | Admitting: Cardiology

## 2019-11-25 ENCOUNTER — Encounter: Payer: Self-pay | Admitting: Neurology

## 2019-12-09 DIAGNOSIS — M25562 Pain in left knee: Secondary | ICD-10-CM | POA: Diagnosis not present

## 2020-01-04 ENCOUNTER — Other Ambulatory Visit (HOSPITAL_COMMUNITY): Payer: Self-pay | Admitting: Cardiology

## 2020-01-11 DIAGNOSIS — M1712 Unilateral primary osteoarthritis, left knee: Secondary | ICD-10-CM | POA: Diagnosis not present
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
## 2020-01-13 ENCOUNTER — Other Ambulatory Visit: Payer: Self-pay | Admitting: Orthopedic Surgery

## 2020-01-14 ENCOUNTER — Other Ambulatory Visit: Payer: Self-pay

## 2020-01-14 ENCOUNTER — Telehealth: Payer: Self-pay | Admitting: *Deleted

## 2020-01-14 ENCOUNTER — Encounter: Payer: Self-pay | Admitting: Neurology

## 2020-01-14 ENCOUNTER — Ambulatory Visit (INDEPENDENT_AMBULATORY_CARE_PROVIDER_SITE_OTHER): Payer: Federal, State, Local not specified - PPO | Admitting: Neurology

## 2020-01-14 VITALS — BP 180/92 | HR 96 | Ht 65.5 in | Wt 189.0 lb

## 2020-01-14 DIAGNOSIS — G43809 Other migraine, not intractable, without status migrainosus: Secondary | ICD-10-CM

## 2020-01-14 DIAGNOSIS — I10 Essential (primary) hypertension: Secondary | ICD-10-CM | POA: Diagnosis not present

## 2020-01-14 MED ORDER — TOPIRAMATE 25 MG PO TABS: ORAL_TABLET | ORAL | 0 refills | Status: DC

## 2020-01-14 NOTE — Patient Instructions (Signed)
1.  They may be migraines, so I will start you on topiramate 25mg  at bedtime for one week, then increase to 25mg  twice daily 2.  Limit use of pain relievers to no more than 2 days out of week to prevent risk of rebound or medication-overuse headache. 3.  Please let me know when you can get an MRI of brain 4.  Follow up in 4 months (virtual visit)

## 2020-01-14 NOTE — Telephone Encounter (Signed)
   [facility] Pre-operative Risk Assessment    Request for surgical clearance:  1. What type of surgery is being performed? LEFT KNEE ARTHROPLASTY   2. When is this surgery scheduled? TBD   3. What type of clearance is required (medical clearance vs. Pharmacy clearance to hold med vs. Both)? MEDICAL  4. Are there any medications that need to be held prior to surgery and how long? ASA    5. Practice name and name of physician performing surgery? [facility]; DR. FRANK ROWAN   6. What is your office phone number [phone]    7.   What is your office fax number [phone] ATTN: REBECCA LANG  8.   Anesthesia type (None, local, MAC, general) ? SPINAL   Julaine Hua 01/14/2020, 8:50 AM  _________________________________________________________________   (provider comments below)

## 2020-01-14 NOTE — Telephone Encounter (Signed)
   Primary Cardiologist: Sinclair Grooms, MD  Chart reviewed as part of pre-operative protocol coverage. Patient was contacted 01/14/2020 in reference to pre-operative risk assessment for pending surgery as outlined below.  Jordan [name] was last seen on 11/21/2019 by Dr Aundra Dubin.  Since that day, Jordan [name] has done well. She has to go a little slow, but is able to get things done. Her weight is stable, she is compliant with medications. DASI score is 5.72 METs.   Therefore, based on ACC/AHA guidelines, the patient would be at acceptable risk for the planned procedure without further cardiovascular testing.   I will route this recommendation to the requesting party via Epic fax function and remove from pre-op pool.  Please call with questions.  Rosaria Ferries, PA-C 01/14/2020, 12:00 PM

## 2020-01-17 NOTE — Patient Instructions (Signed)
DUE TO COVID-19 ONLY ONE VISITOR IS ALLOWED TO COME WITH YOU AND STAY IN THE WAITING ROOM ONLY DURING PRE OP AND PROCEDURE DAY OF SURGERY. THE 1 VISITOR MAY VISIT WITH YOU AFTER SURGERY IN YOUR PRIVATE ROOM DURING VISITING HOURS ONLY!  YOU NEED TO HAVE A COVID 19 TEST ON_2/11/21______ @___10 :40____, THIS TEST MUST BE DONE BEFORE SURGERY, COME  [place] , [address].  ([place]) ONCE YOUR COVID TEST IS COMPLETED, PLEASE BEGIN THE QUARANTINE INSTRUCTIONS AS OUTLINED IN YOUR HANDOUT.                [name]   Your procedure is scheduled on: 01/24/20   Report to [facility] Main  Entrance   Report to Short Stay at 5:30 AM. See map     Call this number if you have problems the morning of surgery [phone]   . BRUSH YOUR TEETH MORNING OF SURGERY AND RINSE YOUR MOUTH OUT, NO CHEWING GUM CANDY OR MINTS.    Do not eat food After Midnight.   YOU MAY HAVE CLEAR LIQUIDS FROM MIDNIGHT UNTIL 4:30 AM.   At 4:30 AM Please finish the prescribed Pre-Surgery Gatorade drink.    Nothing by mouth after you finish the Gatorade drink !   Take these medicines the morning of surgery with A SIP OF WATER: Coreg, Entresto  DO NOT TAKE ANY DIABETIC MEDICATIONS DAY OF YOUR SURGERY                               You may not have any metal on your body including hair pins and              piercings  Do not wear jewelry, make-up, lotions, powders or perfumes, deodorant             Do not wear nail polish on your fingernails.  Do not shave  48 hours prior to surgery.           Do not bring valuables to the hospital. [place].  Contacts, dentures or bridgework may not be worn into surgery.      Patients discharged the day of surgery will not be allowed to drive home.   IF YOU ARE HAVING SURGERY AND GOING HOME THE SAME DAY, YOU MUST HAVE AN ADULT TO DRIVE YOU HOME AND BE WITH YOU FOR 24 HOURS.  YOU MAY  GO HOME BY TAXI OR UBER OR ORTHERWISE, BUT AN ADULT MUST ACCOMPANY YOU HOME AND STAY WITH YOU FOR 24 HOURS.  Name and phone number of your driver:  Special Instructions: N/A              Please read over the following fact sheets you were given: _____________________________________________________________________             [facility] - Preparing for Surgery Before surgery, you can play an important role .  Because skin is not sterile, your skin needs to be as free of germs as possible.   You can reduce the number of germs on your skin by washing with CHG (chlorahexidine gluconate) soap before surgery.   CHG is an antiseptic cleaner which kills germs and bonds with the skin to continue killing germs even after washing. Please DO NOT use if you have an allergy  to CHG or antibacterial soaps.   If your skin becomes reddened/irritated stop using the CHG and inform your nurse when you arrive at Short Stay. Do not shave (including legs and underarms) for at least 48 hours prior to the first CHG shower.  Please follow these instructions carefully:  1.  Shower with CHG Soap the night before surgery and the  morning of Surgery.  2.  If you choose to wash your hair, wash your hair first as usual with your  normal  shampoo.  3.  After you shampoo, rinse your hair and body thoroughly to remove the  shampoo.                                        4.  Use CHG as you would any other liquid soap.  You can apply chg directly  to the skin and wash                       Gently with a scrungie or clean washcloth.  5.  Apply the CHG Soap to your body ONLY FROM THE NECK DOWN.   Do not use on face/ open                           Wound or open sores. Avoid contact with eyes, ears mouth and genitals (private parts).                       Wash face,  Genitals (private parts) with your normal soap.             6.  Wash thoroughly, paying special attention to the area where your surgery  will be performed.  7.   Thoroughly rinse your body with warm water from the neck down.  8.  DO NOT shower/wash with your normal soap after using and rinsing off  the CHG Soap.             9.  Pat yourself dry with a clean towel.            10.  Wear clean pajamas.            11.  Place clean sheets on your bed the night of your first shower and do not  sleep with pets. Day of Surgery : Do not apply any lotions/deodorants the morning of surgery.  Please wear clean clothes to the hospital/surgery center.  FAILURE TO FOLLOW THESE INSTRUCTIONS MAY RESULT IN THE CANCELLATION OF YOUR SURGERY PATIENT SIGNATURE_________________________________  NURSE SIGNATURE__________________________________  ________________________________________________________________________   Adam Phenix  An incentive spirometer is a tool that can help keep your lungs clear and active. This tool measures how well you are filling your lungs with each breath. Taking long deep breaths may help reverse or decrease the chance of developing breathing (pulmonary) problems (especially infection) following:  A long period of time when you are unable to move or be active. BEFORE THE PROCEDURE   If the spirometer includes an indicator to show your best effort, your nurse or respiratory therapist will set it to a desired goal.  If possible, sit up straight or lean slightly forward. Try not to slouch.  Hold the incentive spirometer in an upright position. INSTRUCTIONS FOR USE  1. Sit on the edge of your bed if possible, or  sit up as far as you can in bed or on a chair. 2. Hold the incentive spirometer in an upright position. 3. Breathe out normally. 4. Place the mouthpiece in your mouth and seal your lips tightly around it. 5. Breathe in slowly and as deeply as possible, raising the piston or the ball toward the top of the column. 6. Hold your breath for 3-5 seconds or for as long as possible. Allow the piston or ball to fall to the bottom of  the column. 7. Remove the mouthpiece from your mouth and breathe out normally. 8. Rest for a few seconds and repeat Steps 1 through 7 at least 10 times every 1-2 hours when you are awake. Take your time and take a few normal breaths between deep breaths. 9. The spirometer may include an indicator to show your best effort. Use the indicator as a goal to work toward during each repetition. 10. After each set of 10 deep breaths, practice coughing to be sure your lungs are clear. If you have an incision (the cut made at the time of surgery), support your incision when coughing by placing a pillow or rolled up towels firmly against it. Once you are able to get out of bed, walk around indoors and cough well. You may stop using the incentive spirometer when instructed by your caregiver.  RISKS AND COMPLICATIONS  Take your time so you do not get dizzy or light-headed.  If you are in pain, you may need to take or ask for pain medication before doing incentive spirometry. It is harder to take a deep breath if you are having pain. AFTER USE  Rest and breathe slowly and easily.  It can be helpful to keep track of a log of your progress. Your caregiver can provide you with a simple table to help with this. If you are using the spirometer at home, follow these instructions: [place] IF:   You are having difficultly using the spirometer.  You have trouble using the spirometer as often as instructed.  Your pain medication is not giving enough relief while using the spirometer.  You develop fever of 100.5 F (38.1 C) or higher. SEEK IMMEDIATE MEDICAL CARE IF:   You cough up bloody sputum that had not been present before.  You develop fever of 102 F (38.9 C) or greater.  You develop worsening pain at or near the incision site. MAKE SURE YOU:   Understand these instructions.  Will watch your condition.  Will get help right away if you are not doing well or get worse. Document  Released: 04/07/2007 Document Revised: 02/17/2012 Document Reviewed: 06/08/2007 [facility] Patient Information 2014 [place], [place].   ________________________________________________________________________

## 2020-01-19 ENCOUNTER — Encounter (HOSPITAL_COMMUNITY)
Admission: RE | Admit: 2020-01-19 | Discharge: 2020-01-19 | Disposition: A | Discharge status: Home / Self Care | Payer: Federal, State, Local not specified - PPO | Source: Ambulatory Visit | Attending: Orthopedic Surgery | Admitting: Orthopedic Surgery

## 2020-01-19 ENCOUNTER — Encounter (HOSPITAL_COMMUNITY): Payer: Self-pay

## 2020-01-19 ENCOUNTER — Other Ambulatory Visit: Payer: Self-pay

## 2020-01-19 HISTORY — DX: Other specified postprocedural states: Z98.890

## 2020-01-19 HISTORY — DX: Other complications of anesthesia, initial encounter: T88.59XA

## 2020-01-19 HISTORY — DX: Dyspnea, unspecified: R06.00

## 2020-01-19 HISTORY — DX: Other specified postprocedural states: R11.2

## 2020-01-19 NOTE — Progress Notes (Signed)
PCP - Dr. Wilhemena Durie Cardiologist -  Dr. Aundra Dubin  Chest x-ray - 2019 EKG - 01/13/20 Stress Test - no ECHO - 10/22/19 Cardiac Cath - 2019  Sleep Study - no CPAP -   Fasting Blood Sugar - Pt doesn't test Checks Blood Sugar __0___ times a day  Blood Thinner Instructions:ASA Aspirin Instructions:No instructions received I told Pt to call her Dr. Maryjane Hurter Dose:  Anesthesia review:   Patient denies shortness of breath, fever, cough and chest pain at PAT appointment yes  Patient verbalized understanding of instructions that were given to them at the PAT appointment. Patient was also instructed that they will need to review over the PAT instructions again at home before surgery. yes

## 2020-01-20 ENCOUNTER — Other Ambulatory Visit (HOSPITAL_COMMUNITY)
Admission: RE | Admit: 2020-01-20 | Discharge: 2020-01-20 | Disposition: A | Discharge status: Home / Self Care | Payer: Federal, State, Local not specified - PPO | Source: Ambulatory Visit

## 2020-01-20 ENCOUNTER — Encounter (HOSPITAL_COMMUNITY)
Admission: RE | Admit: 2020-01-20 | Discharge: 2020-01-20 | Disposition: A | Discharge status: Home / Self Care | Payer: Federal, State, Local not specified - PPO | Source: Ambulatory Visit | Attending: Orthopedic Surgery | Admitting: Orthopedic Surgery

## 2020-01-20 ENCOUNTER — Ambulatory Visit (HOSPITAL_COMMUNITY)
Admission: RE | Admit: 2020-01-20 | Discharge: 2020-01-20 | Disposition: A | Discharge status: Home / Self Care | Payer: Federal, State, Local not specified - PPO | Source: Ambulatory Visit | Attending: Orthopedic Surgery | Admitting: Orthopedic Surgery

## 2020-01-20 DIAGNOSIS — Z20822 Contact with and (suspected) exposure to covid-19: Secondary | ICD-10-CM | POA: Diagnosis not present

## 2020-01-20 DIAGNOSIS — Z01812 Encounter for preprocedural laboratory examination: Secondary | ICD-10-CM | POA: Diagnosis not present

## 2020-01-20 DIAGNOSIS — Z87891 Personal history of nicotine dependence: Secondary | ICD-10-CM | POA: Diagnosis not present

## 2020-01-20 DIAGNOSIS — M1712 Unilateral primary osteoarthritis, left knee: Secondary | ICD-10-CM | POA: Insufficient documentation

## 2020-01-20 DIAGNOSIS — Z01818 Encounter for other preprocedural examination: Secondary | ICD-10-CM

## 2020-01-20 DIAGNOSIS — Z79899 Other long term (current) drug therapy: Secondary | ICD-10-CM | POA: Insufficient documentation

## 2020-01-20 DIAGNOSIS — I110 Hypertensive heart disease with heart failure: Secondary | ICD-10-CM | POA: Insufficient documentation

## 2020-01-20 DIAGNOSIS — I5022 Chronic systolic (congestive) heart failure: Secondary | ICD-10-CM | POA: Diagnosis not present

## 2020-01-20 DIAGNOSIS — E781 Pure hyperglyceridemia: Secondary | ICD-10-CM | POA: Diagnosis not present

## 2020-01-20 DIAGNOSIS — I10 Essential (primary) hypertension: Secondary | ICD-10-CM | POA: Diagnosis not present

## 2020-01-20 LAB — URINALYSIS, ROUTINE W REFLEX MICROSCOPIC
Bacteria, UA: NONE SEEN
Bilirubin Urine: NEGATIVE
Glucose, UA: NEGATIVE mg/dL
Ketones, ur: NEGATIVE mg/dL
Leukocytes,Ua: NEGATIVE
Nitrite: NEGATIVE
Protein, ur: NEGATIVE mg/dL
Specific Gravity, Urine: 1.003 — ABNORMAL LOW (ref 1.005–1.030)
pH: 7 (ref 5.0–8.0)

## 2020-01-20 LAB — CBC WITH DIFFERENTIAL/PLATELET
Abs Immature Granulocytes: 0.02 10*3/uL (ref 0.00–0.07)
Basophils Absolute: 0.1 10*3/uL (ref 0.0–0.1)
Basophils Relative: 1 %
Eosinophils Absolute: 0.2 10*3/uL (ref 0.0–0.5)
Eosinophils Relative: 3 %
HCT: 42.8 % (ref 36.0–46.0)
Hemoglobin: 13.9 g/dL (ref 12.0–15.0)
Immature Granulocytes: 0 %
Lymphocytes Relative: 23 %
Lymphs Abs: 1.9 10*3/uL (ref 0.7–4.0)
MCH: 32 pg (ref 26.0–34.0)
MCHC: 32.5 g/dL (ref 30.0–36.0)
MCV: 98.6 fL (ref 80.0–100.0)
Monocytes Absolute: 0.7 10*3/uL (ref 0.1–1.0)
Monocytes Relative: 8 %
Neutro Abs: 5.4 10*3/uL (ref 1.7–7.7)
Neutrophils Relative %: 65 %
Platelets: 236 10*3/uL (ref 150–400)
RBC: 4.34 MIL/uL (ref 3.87–5.11)
RDW: 12.1 % (ref 11.5–15.5)
WBC: 8.2 10*3/uL (ref 4.0–10.5)
nRBC: 0 % (ref 0.0–0.2)

## 2020-01-20 LAB — SURGICAL PCR SCREEN
MRSA, PCR: POSITIVE — AB
Staphylococcus aureus: POSITIVE — AB

## 2020-01-20 LAB — BASIC METABOLIC PANEL
Anion gap: 9 (ref 5–15)
BUN: 14 mg/dL (ref 6–20)
CO2: 30 mmol/L (ref 22–32)
Calcium: 10.1 mg/dL (ref 8.9–10.3)
Chloride: 103 mmol/L (ref 98–111)
Creatinine, Ser: 0.96 mg/dL (ref 0.44–1.00)
GFR calc Af Amer: >60 mL/min (ref >60)
GFR calc non Af Amer: >60 mL/min (ref >60)
Glucose, Bld: 126 mg/dL — ABNORMAL HIGH (ref 70–99)
Potassium: 4.2 mmol/L (ref 3.5–5.1)
Sodium: 142 mmol/L (ref 135–145)

## 2020-01-20 LAB — PROTIME-INR
INR: 0.9 (ref 0.8–1.2)
Prothrombin Time: 12.4 seconds (ref 11.4–15.2)

## 2020-01-20 LAB — ABO/RH: ABO/RH(D): O POS

## 2020-01-20 LAB — HEMOGLOBIN A1C
Hgb A1c MFr Bld: 6.4 % — ABNORMAL HIGH (ref 4.8–5.6)
Mean Plasma Glucose: 136.98 mg/dL

## 2020-01-20 LAB — APTT: aPTT: 32 seconds (ref 24–36)

## 2020-01-20 LAB — SARS CORONAVIRUS 2 (TAT 6-24 HRS): SARS Coronavirus 2: NEGATIVE
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
## 2020-01-20 NOTE — H&P (Signed)
TOTAL KNEE ADMISSION H&P  Patient is being admitted for left total knee arthroplasty.  Subjective:  Chief Complaint:left knee pain.  HPI: Jordan [name], 59 y.o. female, has a history of pain and functional disability in the left knee due to arthritis and has failed non-surgical conservative treatments for greater than 12 weeks to includeNSAID's and/or analgesics, use of assistive devices, weight reduction as appropriate and activity modification.  Onset of symptoms was abrupt, starting 1 years ago with rapidlly worsening course since that time. The patient noted no past surgery on the left knee(s).  Patient currently rates pain in the left knee(s) at 10 out of 10 with activity. Patient has night pain, worsening of pain with activity and weight bearing, pain that interferes with activities of daily living, pain with passive range of motion, crepitus and joint swelling.  Patient has evidence of joint space narrowing by imaging studies.  There is no active infection.  Patient Active Problem List   Diagnosis Date Noted  . Acute systolic heart failure ([place]) 03/05/2018  . Chronic systolic heart failure ([place]) 03/04/2018  . MVP (mitral valve prolapse) 03/02/2018  . Hypertension 03/02/2018  . Pleural effusion 03/02/2018  . S/P total knee arthroplasty 08/29/2013   Past Medical History:  Diagnosis Date  . Arthritis   . Arthritis of knee, left 08/25/2013  . Cardiomegaly 03/02/2018  . CHF (congestive heart failure) ([place]) 2018   last SOB 08/2019  . Complication of anesthesia   . Dyspnea   . Dysrhythmia 2018  . Hypertension   . Hyponatremia 08/26/2013  . MVP (mitral valve prolapse)   . PONV (postoperative nausea and vomiting)   . Pre-diabetes   . Tobacco abuse     Past Surgical History:  Procedure Laterality Date  . ABDOMINAL HYSTERECTOMY  94  . APPENDECTOMY    . CERVICAL DISCECTOMY  08  . KNEE ARTHROSCOPY Right 09,and ?  . RIGHT/LEFT HEART CATH AND CORONARY ANGIOGRAPHY N/A 03/09/2018   Procedure: RIGHT/LEFT HEART CATH AND CORONARY ANGIOGRAPHY;  Surgeon: Nelva Bush, MD;  Location: [facility];  Service: Cardiovascular;  Laterality: N/A;  . SHOULDER ARTHROSCOPY Left    bone spur  . TOTAL KNEE ARTHROPLASTY Right 08/25/2013   Procedure: TOTAL KNEE ARTHROPLASTY;  Surgeon: Kerin Salen, MD;  Location: [facility];  Service: Orthopedics;  Laterality: Right;    No current facility-administered medications for this encounter.   Current Outpatient Medications  Medication Sig Dispense Refill Last Dose  . acetaminophen (TYLENOL) 500 MG tablet Take 500 mg by mouth every 6 (six) hours as needed for moderate pain.     Marland Kitchen aspirin EC 81 MG tablet Take 81 mg by mouth daily.     Marland Kitchen CALCIUM PO Take 600 mg by mouth daily.      . carvedilol (COREG) 25 MG tablet TAKE 1 TABLET BY MOUTH TWICE A DAY (Patient taking differently: Take 25 mg by mouth 2 (two) times daily with a meal. ) 180 tablet 2   . clonazePAM (KLONOPIN) 0.5 MG tablet Take 0.5 mg by mouth at bedtime.     Marland Kitchen ENTRESTO 97-103 MG TAKE 1 TABLET BY MOUTH TWICE A DAY (Patient taking differently: Take 1 tablet by mouth 2 (two) times daily. ) 60 tablet 3   . furosemide (LASIX) 40 MG tablet TAKE 1 TABLET BY MOUTH EVERY DAY (Patient taking differently: Take 40 mg by mouth daily. ) 90 tablet 1   . ipratropium (ATROVENT) 0.06 % nasal spray Place 2 sprays into both nostrils  2 (two) times daily as needed for rhinitis.   0   . LOKELMA 10 g PACK packet TAKE 10G (1 PACKET) BY MOUTH DAILY. (Patient taking differently: Take 10 g by mouth daily. ) 30 packet 3   . metFORMIN (GLUCOPHAGE) 500 MG tablet Take 500 mg by mouth at bedtime.      . Multiple Vitamins-Minerals (MULTIVITAMIN WITH MINERALS) tablet Take 1 tablet by mouth daily.     . pravastatin (PRAVACHOL) 80 MG tablet Take 80 mg by mouth daily.     Marland Kitchen spironolactone (ALDACTONE) 25 MG tablet Take 1 tablet (25 mg total) by mouth daily. 90 tablet 3   . Venlafaxine HCl 225 MG TB24 Take 225 mg by mouth  daily.   1   . amoxicillin (AMOXIL) 500 MG tablet Take 4 tablets 30-60 minutes before procedure 4 tablet 0 Not Taking at Unknown time  . topiramate (TOPAMAX) 25 MG tablet Take 1 tablet at bedtime for 1 week, then increase to 1 tablet twice daily 60 tablet 0    Allergies  Allergen Reactions  . Guaifenesin & Derivatives Hives  . Naprosyn [Naproxen] Hives    Social History   Tobacco Use  . Smoking status: Former Smoker    Packs/day: 1.00    Years: 20.00    Pack years: 20.00    Types: Cigarettes    Quit date: 04/21/2018    Years since quitting: 1.7  . Smokeless tobacco: Never Used  . Tobacco comment: Had smoked for over 20 years  Substance Use Topics  . Alcohol use: Not Currently    Alcohol/week: 1.0 standard drinks    Types: 1 Standard drinks or equivalent per week    Comment: Rare - 1 drink every 2-3 months    Family History  Problem Relation Age of Onset  . CAD Father        5v CABG in his 67s  . Diabetes Father   . Hypertension Father   . Liver disease Sister   . Diabetes Brother      Review of Systems  Constitutional: Negative.   HENT: Negative.   Eyes: Negative.   Respiratory: Positive for shortness of breath.   Cardiovascular: Positive for palpitations.  Gastrointestinal: Negative.   Endocrine: Negative.   Genitourinary: Negative.   Musculoskeletal: Positive for arthralgias and myalgias.  Skin: Negative.   Allergic/Immunologic: Negative.   Neurological: Negative.   Psychiatric/Behavioral: The patient is nervous/anxious.     Objective:  Physical Exam  Constitutional: She is oriented to person, place, and time. She appears well-developed and well-nourished.  HENT:  Head: Normocephalic and atraumatic.  Eyes: Pupils are equal, round, and reactive to light.  Cardiovascular: Intact distal pulses.  Respiratory: Effort normal.  Musculoskeletal:        General: Tenderness present.     Cervical back: Normal range of motion and neck supple.     Comments: the  patient's right knee has good strength good range of motion and no pain.  Patient's left knee has a range from roughly 5 to 115.  No instability.  Moderate pain with palpation over the medial joint line.  Her calves are soft and nontender.  Neurological: She is alert and oriented to person, place, and time.  Skin: Skin is warm and dry.  Psychiatric: She has a normal mood and affect. Her behavior is normal. Judgment and thought content normal.    Vital signs in last 24 hours:    Labs:   Estimated body mass index is 30.97 kg/m  as calculated from the following:   Height as of 01/14/20: 5' 5.5" (1.664 m).   Weight as of 01/14/20: 85.7 kg.   Imaging Review Plain radiographs demonstrate bilateral AP weightbearing, bilateral Rosenberg, lateral and sunrise views of the left knee are taken and reviewed in office today.  This shows a well-placed well fixed right total knee arthroplasty with Sigma RP design.  Patient's left knee has end-stage bone-on-bone arthritis of the medial compartment.   Assessment/Plan:  End stage arthritis, left knee   The patient history, physical examination, clinical judgment of the provider and imaging studies are consistent with end stage degenerative joint disease of the left knee(s) and total knee arthroplasty is deemed medically necessary. The treatment options including medical management, injection therapy arthroscopy and arthroplasty were discussed at length. The risks and benefits of total knee arthroplasty were presented and reviewed. The risks due to aseptic loosening, infection, stiffness, patella tracking problems, thromboembolic complications and other imponderables were discussed. The patient acknowledged the explanation, agreed to proceed with the plan and consent was signed. Patient is being admitted for inpatient treatment for surgery, pain control, PT, OT, prophylactic antibiotics, VTE prophylaxis, progressive ambulation and ADL's and discharge planning.  The patient is planning to be discharged home with home health services     Patient's anticipated LOS is less than 2 midnights, meeting these requirements: - Younger than 28 - Lives within 1 hour of care - Has a competent adult at home to recover with post-op recover - NO history of  - Chronic pain requiring opiods  - Diabetes  - Coronary Artery Disease  - Heart failure  - Heart attack  - Stroke  - DVT/VTE  - Cardiac arrhythmia  - Respiratory Failure/COPD  - Renal failure  - Anemia  - Advanced Liver disease

## 2020-01-20 NOTE — Progress Notes (Signed)
PCR results routed to Dr. Mayer Camel for review

## 2020-01-23 MED ORDER — TRANEXAMIC ACID 1000 MG/10ML IV SOLN
2000.0000 mg | INTRAVENOUS | Status: DC
  Filled 2020-01-23: qty 20

## 2020-01-23 MED ORDER — BUPIVACAINE LIPOSOME 1.3 % IJ SUSP
20.0000 mL | INTRAMUSCULAR | Status: DC
  Filled 2020-01-23: qty 20

## 2020-01-23 NOTE — Anesthesia Preprocedure Evaluation (Addendum)
Anesthesia Evaluation  Patient identified by MRN, date of birth, ID band Patient awake    Reviewed: Allergy & Precautions, NPO status , Patient's Chart, lab work & pertinent test results  History of Anesthesia Complications (+) PONV and history of anesthetic complications  Airway Mallampati: II  TM Distance: >3 FB Neck ROM: Full    Dental  (+) Dental Advisory Given   Pulmonary former smoker,    Pulmonary exam normal        Cardiovascular hypertension, Pt. on home beta blockers and Pt. on medications +CHF  Normal cardiovascular exam+ Valvular Problems/Murmurs MVP and MR    Pt cleared by cardiology 01/14/2020.  Per Rosaria Ferries, PA-C, "Patient was contacted2/5/2021in reference to pre-operative risk assessment for pending surgery as outlined below. Jordan [name] last seen on 12/13/2020by Dr Aundra Dubin. Since that day, Jordan [name] done well.She has to go a little slow, but is able to get things done. Her weight is stable, she is compliant with medications. DASI score is 5.72 METs. Therefore, based on ACC/AHA guidelines, the patient would be at acceptable risk for the planned procedure without further cardiovascular testing."  '20 Cardiac MRI - Normal left ventricular size with mild LV hypertrophy. EF 46%, diffuse hypokinesis.  Normal right ventricular size and systolic function, EF [id number]. No myocardial LGE so no evidence for prior MI, infiltrative disease, or myocarditis.  '20 Stress Test - Exercise testing with gas exchange demonstrates mild functional impairment when compared to matched sedentary norms. There is no clear cardiopulmonary limitation. There was chronotropic incompetence with incremental exercise and possible influence on reduced O2 pulse (surrogate for stroke volume). Patient's body habitus appears to be playing a role in her exercise intolerance.   '19 TTE - LV was severely dilated. Mild LVH. EF 25% to 30%.  Diffuse hypokinesis. Mild MR.  '19 Cath - 1.No angiographically significant coronary artery disease, consistent with non-ischemic cardiomyopathy. 2.Normal left heart, right heart, and pulmonary artery pressures. 3.Mildly decreased cardiac output.   Neuro/Psych negative neurological ROS  negative psych ROS   GI/Hepatic negative GI ROS, Neg liver ROS,   Endo/Other   Obesity Pre-DM   Renal/GU negative Renal ROS     Musculoskeletal  (+) Arthritis ,   Abdominal   Peds  Hematology negative hematology ROS (+)  Plt 236k    Anesthesia Other Findings Covid neg 01/20/20   Reproductive/Obstetrics                            Anesthesia Physical Anesthesia Plan  ASA: III  Anesthesia Plan: Spinal   Post-op Pain Management:    Induction:   PONV Risk Score and Plan: 3 and Treatment may vary due to age or medical condition and Propofol infusion  Airway Management Planned: Natural Airway and Simple Face Mask  Additional Equipment: None  Intra-op Plan:   Post-operative Plan:   Informed Consent: I have reviewed the patients History and Physical, chart, labs and discussed the procedure including the risks, benefits and alternatives for the proposed anesthesia with the patient or authorized representative who has indicated his/her understanding and acceptance.       Plan Discussed with: CRNA and Anesthesiologist  Anesthesia Plan Comments: (Labs reviewed, platelets acceptable. Discussed risks and benefits of spinal, including spinal/epidural hematoma, infection, failed block, and PDPH. Patient expressed understanding and wished to proceed. )       Anesthesia Quick Evaluation

## 2020-01-24 ENCOUNTER — Ambulatory Visit (HOSPITAL_COMMUNITY): Payer: Federal, State, Local not specified - PPO | Admitting: Physician Assistant

## 2020-01-24 ENCOUNTER — Encounter (HOSPITAL_COMMUNITY)
Admission: RE | Disposition: A | Discharge status: Home / Self Care | Payer: Self-pay | Source: Other Acute Inpatient Hospital | Attending: Orthopedic Surgery

## 2020-01-24 ENCOUNTER — Ambulatory Visit (HOSPITAL_COMMUNITY): Payer: Federal, State, Local not specified - PPO | Admitting: Anesthesiology

## 2020-01-24 ENCOUNTER — Encounter (HOSPITAL_COMMUNITY): Payer: Self-pay | Admitting: Orthopedic Surgery

## 2020-01-24 ENCOUNTER — Ambulatory Visit (HOSPITAL_COMMUNITY)
Admission: RE | Admit: 2020-01-24 | Discharge: 2020-01-24 | Disposition: A | Discharge status: Home / Self Care | Payer: Federal, State, Local not specified - PPO | Source: Other Acute Inpatient Hospital | Attending: Orthopedic Surgery | Admitting: Orthopedic Surgery

## 2020-01-24 DIAGNOSIS — M1712 Unilateral primary osteoarthritis, left knee: Secondary | ICD-10-CM | POA: Insufficient documentation

## 2020-01-24 DIAGNOSIS — Z7984 Long term (current) use of oral hypoglycemic drugs: Secondary | ICD-10-CM | POA: Insufficient documentation

## 2020-01-24 DIAGNOSIS — G8918 Other acute postprocedural pain: Secondary | ICD-10-CM | POA: Diagnosis not present

## 2020-01-24 DIAGNOSIS — Z888 Allergy status to other drugs, medicaments and biological substances status: Secondary | ICD-10-CM | POA: Insufficient documentation

## 2020-01-24 DIAGNOSIS — I5022 Chronic systolic (congestive) heart failure: Secondary | ICD-10-CM | POA: Diagnosis not present

## 2020-01-24 DIAGNOSIS — Z8249 Family history of ischemic heart disease and other diseases of the circulatory system: Secondary | ICD-10-CM | POA: Diagnosis not present

## 2020-01-24 DIAGNOSIS — Z96651 Presence of right artificial knee joint: Secondary | ICD-10-CM | POA: Diagnosis not present

## 2020-01-24 DIAGNOSIS — Z79899 Other long term (current) drug therapy: Secondary | ICD-10-CM | POA: Insufficient documentation

## 2020-01-24 DIAGNOSIS — Z7982 Long term (current) use of aspirin: Secondary | ICD-10-CM | POA: Insufficient documentation

## 2020-01-24 DIAGNOSIS — Z8709 Personal history of other diseases of the respiratory system: Secondary | ICD-10-CM | POA: Diagnosis not present

## 2020-01-24 DIAGNOSIS — Z87891 Personal history of nicotine dependence: Secondary | ICD-10-CM | POA: Diagnosis not present

## 2020-01-24 DIAGNOSIS — M25762 Osteophyte, left knee: Secondary | ICD-10-CM | POA: Diagnosis not present

## 2020-01-24 DIAGNOSIS — I341 Nonrheumatic mitral (valve) prolapse: Secondary | ICD-10-CM | POA: Insufficient documentation

## 2020-01-24 DIAGNOSIS — M21162 Varus deformity, not elsewhere classified, left knee: Secondary | ICD-10-CM | POA: Diagnosis not present

## 2020-01-24 DIAGNOSIS — Z886 Allergy status to analgesic agent status: Secondary | ICD-10-CM | POA: Diagnosis not present

## 2020-01-24 DIAGNOSIS — I110 Hypertensive heart disease with heart failure: Secondary | ICD-10-CM | POA: Diagnosis not present

## 2020-01-24 DIAGNOSIS — J90 Pleural effusion, not elsewhere classified: Secondary | ICD-10-CM | POA: Diagnosis not present

## 2020-01-24 HISTORY — PX: TOTAL KNEE ARTHROPLASTY: SHX125

## 2020-01-24 LAB — TYPE AND SCREEN
ABO/RH(D): O POS
Antibody Screen: NEGATIVE

## 2020-01-24 LAB — GLUCOSE, CAPILLARY: Glucose-Capillary: 121 mg/dL — ABNORMAL HIGH (ref 70–99)

## 2020-01-24 SURGERY — ARTHROPLASTY, KNEE, TOTAL
Anesthesia: Spinal | Site: Knee | Laterality: Left

## 2020-01-24 MED ORDER — BUPIVACAINE HCL 0.25 % IJ SOLN
INTRAMUSCULAR | Status: AC
  Filled 2020-01-24: qty 1

## 2020-01-24 MED ORDER — FENTANYL CITRATE (PF) 100 MCG/2ML IJ SOLN
INTRAMUSCULAR | Status: DC | PRN
  Administered 2020-01-24: 25 ug via INTRAVENOUS
  Administered 2020-01-24: 50 ug via INTRAVENOUS

## 2020-01-24 MED ORDER — PROPOFOL 500 MG/50ML IV EMUL
INTRAVENOUS | Status: DC | PRN
  Administered 2020-01-24: 75 ug/kg/min via INTRAVENOUS

## 2020-01-24 MED ORDER — PHENYLEPHRINE 40 MCG/ML (10ML) SYRINGE FOR IV PUSH (FOR BLOOD PRESSURE SUPPORT)
PREFILLED_SYRINGE | INTRAVENOUS | Status: AC
  Filled 2020-01-24: qty 10

## 2020-01-24 MED ORDER — ONDANSETRON HCL 4 MG/2ML IJ SOLN
INTRAMUSCULAR | Status: DC | PRN
  Administered 2020-01-24: 4 mg via INTRAVENOUS

## 2020-01-24 MED ORDER — HYDROMORPHONE HCL 1 MG/ML IJ SOLN: 0.5000 mg | INTRAMUSCULAR | Status: DC | PRN

## 2020-01-24 MED ORDER — SODIUM CHLORIDE 0.9 % IR SOLN
Status: DC | PRN
  Administered 2020-01-24: 1000 mL

## 2020-01-24 MED ORDER — MIDAZOLAM HCL 2 MG/2ML IJ SOLN
INTRAMUSCULAR | Status: AC
  Filled 2020-01-24: qty 2

## 2020-01-24 MED ORDER — PROMETHAZINE HCL 25 MG/ML IJ SOLN: 6.2500 mg | INTRAMUSCULAR | Status: DC | PRN

## 2020-01-24 MED ORDER — ASPIRIN EC 81 MG PO TBEC: 81.0000 mg | DELAYED_RELEASE_TABLET | Freq: Two times a day (BID) | ORAL | 0 refills | Status: DC

## 2020-01-24 MED ORDER — CEFAZOLIN SODIUM-DEXTROSE 2-4 GM/100ML-% IV SOLN
2.0000 g | INTRAVENOUS | Status: AC
  Administered 2020-01-24: 2 g via INTRAVENOUS
  Filled 2020-01-24: qty 100

## 2020-01-24 MED ORDER — LIDOCAINE 2% (20 MG/ML) 5 ML SYRINGE
INTRAMUSCULAR | Status: DC | PRN
  Administered 2020-01-24: 40 mg via INTRAVENOUS

## 2020-01-24 MED ORDER — METHOCARBAMOL 500 MG IVPB - SIMPLE MED
500.0000 mg | Freq: Four times a day (QID) | INTRAVENOUS | Status: DC | PRN
  Administered 2020-01-24: 500 mg via INTRAVENOUS

## 2020-01-24 MED ORDER — FENTANYL CITRATE (PF) 100 MCG/2ML IJ SOLN: 25.0000 ug | INTRAMUSCULAR | Status: DC | PRN

## 2020-01-24 MED ORDER — OXYCODONE HCL 5 MG PO TABS
ORAL_TABLET | ORAL | Status: AC
  Filled 2020-01-24: qty 2

## 2020-01-24 MED ORDER — TRANEXAMIC ACID-NACL 1000-0.7 MG/100ML-% IV SOLN
1000.0000 mg | INTRAVENOUS | Status: AC
  Administered 2020-01-24: 1000 mg via INTRAVENOUS
  Filled 2020-01-24: qty 100

## 2020-01-24 MED ORDER — METHOCARBAMOL 500 MG PO TABS: 500.0000 mg | ORAL_TABLET | Freq: Four times a day (QID) | ORAL | Status: DC | PRN

## 2020-01-24 MED ORDER — OXYCODONE-ACETAMINOPHEN 5-325 MG PO TABS: 1.0000 | ORAL_TABLET | ORAL | 0 refills | Status: AC | PRN

## 2020-01-24 MED ORDER — METHOCARBAMOL 500 MG IVPB - SIMPLE MED
INTRAVENOUS | Status: AC
  Filled 2020-01-24: qty 50

## 2020-01-24 MED ORDER — PROPOFOL 10 MG/ML IV BOLUS
INTRAVENOUS | Status: AC
  Filled 2020-01-24: qty 20

## 2020-01-24 MED ORDER — CHLORHEXIDINE GLUCONATE 4 % EX LIQD: 60.0000 mL | Freq: Once | CUTANEOUS | Status: DC

## 2020-01-24 MED ORDER — PHENYLEPHRINE HCL-NACL 10-0.9 MG/250ML-% IV SOLN
INTRAVENOUS | Status: DC | PRN
  Administered 2020-01-24: 20 ug/min via INTRAVENOUS

## 2020-01-24 MED ORDER — BUPIVACAINE HCL 0.25 % IJ SOLN
INTRAMUSCULAR | Status: DC | PRN
  Administered 2020-01-24: 30 mL

## 2020-01-24 MED ORDER — MIDAZOLAM HCL 2 MG/2ML IJ SOLN
INTRAMUSCULAR | Status: DC | PRN
  Administered 2020-01-24 (×2): 1 mg via INTRAVENOUS

## 2020-01-24 MED ORDER — DEXAMETHASONE SODIUM PHOSPHATE 10 MG/ML IJ SOLN
INTRAMUSCULAR | Status: AC
  Filled 2020-01-24: qty 1

## 2020-01-24 MED ORDER — LACTATED RINGERS IV BOLUS
250.0000 mL | Freq: Once | INTRAVENOUS | Status: AC
  Administered 2020-01-24: 250 mL via INTRAVENOUS

## 2020-01-24 MED ORDER — DEXAMETHASONE SODIUM PHOSPHATE 10 MG/ML IJ SOLN
INTRAMUSCULAR | Status: DC | PRN
  Administered 2020-01-24: 8 mg via INTRAVENOUS

## 2020-01-24 MED ORDER — SODIUM CHLORIDE (PF) 0.9 % IJ SOLN
INTRAMUSCULAR | Status: AC
  Filled 2020-01-24: qty 20

## 2020-01-24 MED ORDER — 0.9 % SODIUM CHLORIDE (POUR BTL) OPTIME
TOPICAL | Status: DC | PRN
  Administered 2020-01-24: 08:00:00 1000 mL

## 2020-01-24 MED ORDER — PHENYLEPHRINE HCL (PRESSORS) 10 MG/ML IV SOLN
INTRAVENOUS | Status: AC
  Filled 2020-01-24: qty 1

## 2020-01-24 MED ORDER — FENTANYL CITRATE (PF) 100 MCG/2ML IJ SOLN
INTRAMUSCULAR | Status: AC
  Filled 2020-01-24: qty 2

## 2020-01-24 MED ORDER — BUPIVACAINE LIPOSOME 1.3 % IJ SUSP
INTRAMUSCULAR | Status: DC | PRN
  Administered 2020-01-24: 20 mL

## 2020-01-24 MED ORDER — TRANEXAMIC ACID-NACL 1000-0.7 MG/100ML-% IV SOLN
1000.0000 mg | Freq: Once | INTRAVENOUS | Status: AC
  Administered 2020-01-24: 10:00:00 1000 mg via INTRAVENOUS

## 2020-01-24 MED ORDER — TIZANIDINE HCL 2 MG PO TABS: 2.0000 mg | ORAL_TABLET | Freq: Four times a day (QID) | ORAL | 0 refills | Status: AC | PRN

## 2020-01-24 MED ORDER — SODIUM CHLORIDE (PF) 0.9 % IJ SOLN
INTRAMUSCULAR | Status: DC | PRN
  Administered 2020-01-24: 70 mL

## 2020-01-24 MED ORDER — LACTATED RINGERS IV SOLN: INTRAVENOUS | Status: DC

## 2020-01-24 MED ORDER — PHENYLEPHRINE 40 MCG/ML (10ML) SYRINGE FOR IV PUSH (FOR BLOOD PRESSURE SUPPORT)
PREFILLED_SYRINGE | INTRAVENOUS | Status: DC | PRN
  Administered 2020-01-24: 40 ug via INTRAVENOUS

## 2020-01-24 MED ORDER — ONDANSETRON HCL 4 MG/2ML IJ SOLN
INTRAMUSCULAR | Status: AC
  Filled 2020-01-24: qty 2

## 2020-01-24 MED ORDER — OXYCODONE HCL 5 MG PO TABS: 5.0000 mg | ORAL_TABLET | Freq: Once | ORAL | Status: DC | PRN

## 2020-01-24 MED ORDER — POVIDONE-IODINE 10 % EX SWAB
2.0000 "application " | Freq: Once | CUTANEOUS | Status: AC
  Administered 2020-01-24: 2 via TOPICAL

## 2020-01-24 MED ORDER — VANCOMYCIN HCL IN DEXTROSE 1-5 GM/200ML-% IV SOLN
1000.0000 mg | INTRAVENOUS | Status: AC
  Administered 2020-01-24: 1000 mg via INTRAVENOUS
  Filled 2020-01-24: qty 200

## 2020-01-24 MED ORDER — BUPIVACAINE IN DEXTROSE 0.75-8.25 % IT SOLN
INTRATHECAL | Status: DC | PRN
  Administered 2020-01-24: 1.4 mL via INTRATHECAL

## 2020-01-24 MED ORDER — LACTATED RINGERS IV BOLUS
500.0000 mL | Freq: Once | INTRAVENOUS | Status: AC
  Administered 2020-01-24: 500 mL via INTRAVENOUS

## 2020-01-24 MED ORDER — OXYCODONE HCL 5 MG/5ML PO SOLN: 5.0000 mg | Freq: Once | ORAL | Status: DC | PRN

## 2020-01-24 MED ORDER — LIDOCAINE 2% (20 MG/ML) 5 ML SYRINGE
INTRAMUSCULAR | Status: AC
  Filled 2020-01-24: qty 5

## 2020-01-24 MED ORDER — TRANEXAMIC ACID 1000 MG/10ML IV SOLN
INTRAVENOUS | Status: DC | PRN
  Administered 2020-01-24: 2000 mg via TOPICAL

## 2020-01-24 MED ORDER — OXYCODONE HCL 5 MG PO TABS
5.0000 mg | ORAL_TABLET | ORAL | Status: DC | PRN
  Administered 2020-01-24: 10 mg via ORAL

## 2020-01-24 MED ORDER — PROPOFOL 500 MG/50ML IV EMUL
INTRAVENOUS | Status: AC
  Filled 2020-01-24: qty 50

## 2020-01-24 MED ORDER — TRANEXAMIC ACID-NACL 1000-0.7 MG/100ML-% IV SOLN
INTRAVENOUS | Status: AC
  Filled 2020-01-24: qty 100

## 2020-01-24 MED ORDER — ROPIVACAINE HCL 7.5 MG/ML IJ SOLN
INTRAMUSCULAR | Status: DC | PRN
  Administered 2020-01-24: 20 mL via PERINEURAL

## 2020-01-24 MED ORDER — SODIUM CHLORIDE (PF) 0.9 % IJ SOLN
INTRAMUSCULAR | Status: AC
  Filled 2020-01-24: qty 50

## 2020-01-24 SURGICAL SUPPLY — 54 items
ATTUNE MED DOME PAT 38 KNEE (Knees) ×1 IMPLANT
ATTUNE PSFEM LTSZ6 NARCEM KNEE (Femur) ×1 IMPLANT
ATTUNE PSRP INSR SZ6 5 KNEE (Insert) ×1 IMPLANT
BAG DECANTER FOR FLEXI CONT (MISCELLANEOUS) ×2 IMPLANT
BAG SPEC THK2 15X12 ZIP CLS (MISCELLANEOUS) ×1
BAG ZIPLOCK 12X15 (MISCELLANEOUS) ×2 IMPLANT
BASE TIBIA ATTUNE KNEE SYS SZ6 (Knees) IMPLANT
BLADE SAG 18X100X1.27 (BLADE) ×2 IMPLANT
BLADE SAW SGTL 11.0X1.19X90.0M (BLADE) ×2 IMPLANT
BLADE SURG SZ10 CARB STEEL (BLADE) ×2 IMPLANT
BNDG CMPR MED 10X6 ELC LF (GAUZE/BANDAGES/DRESSINGS) ×1
BNDG ELASTIC 6X10 VLCR STRL LF (GAUZE/BANDAGES/DRESSINGS) ×2 IMPLANT
BOWL SMART MIX CTS (DISPOSABLE) ×2 IMPLANT
BSPLAT TIB 6 CMNT ROT PLAT STR (Knees) ×1 IMPLANT
CEMENT HV SMART SET (Cement) ×4 IMPLANT
COVER SURGICAL LIGHT HANDLE (MISCELLANEOUS) ×2 IMPLANT
COVER WAND RF STERILE (DRAPES) IMPLANT
CUFF TOURN SGL QUICK 34 (TOURNIQUET CUFF) ×2
CUFF TRNQT CYL 34X4.125X (TOURNIQUET CUFF) ×1 IMPLANT
DECANTER SPIKE VIAL GLASS SM (MISCELLANEOUS) ×6 IMPLANT
DRAPE U-SHAPE 47X51 STRL (DRAPES) ×2 IMPLANT
DRSG AQUACEL AG ADV 3.5X10 (GAUZE/BANDAGES/DRESSINGS) ×2 IMPLANT
DURAPREP 26ML APPLICATOR (WOUND CARE) ×2 IMPLANT
ELECT REM PT RETURN 15FT ADLT (MISCELLANEOUS) ×2 IMPLANT
GLOVE BIO SURGEON STRL SZ7.5 (GLOVE) ×2 IMPLANT
GLOVE BIO SURGEON STRL SZ8.5 (GLOVE) ×2 IMPLANT
GLOVE BIOGEL PI IND STRL 8 (GLOVE) ×1 IMPLANT
GLOVE BIOGEL PI IND STRL 9 (GLOVE) ×1 IMPLANT
GLOVE BIOGEL PI INDICATOR 8 (GLOVE) ×1
GLOVE BIOGEL PI INDICATOR 9 (GLOVE) ×1
GOWN STRL REUS W/TWL XL LVL3 (GOWN DISPOSABLE) ×4 IMPLANT
HANDPIECE INTERPULSE COAX TIP (DISPOSABLE)
HOOD PEEL AWAY FLYTE STAYCOOL (MISCELLANEOUS) ×6 IMPLANT
KIT TURNOVER KIT A (KITS) ×1 IMPLANT
NDL HYPO 21X1.5 SAFETY (NEEDLE) ×2 IMPLANT
NEEDLE HYPO 21X1.5 SAFETY (NEEDLE) ×4 IMPLANT
NS IRRIG 1000ML POUR BTL (IV SOLUTION) ×1 IMPLANT
PACK ICE MAXI GEL EZY WRAP (MISCELLANEOUS) ×1 IMPLANT
PACK TOTAL KNEE CUSTOM (KITS) ×2 IMPLANT
PENCIL SMOKE EVACUATOR (MISCELLANEOUS) ×1 IMPLANT
PIN STEINMAN FIXATION KNEE (PIN) ×1 IMPLANT
PROTECTOR NERVE ULNAR (MISCELLANEOUS) ×2 IMPLANT
SET HNDPC FAN SPRY TIP SCT (DISPOSABLE) ×1 IMPLANT
SUT VIC AB 1 CTX 36 (SUTURE) ×2
SUT VIC AB 1 CTX36XBRD ANBCTR (SUTURE) ×1 IMPLANT
SUT VIC AB 3-0 CT1 27 (SUTURE) ×6
SUT VIC AB 3-0 CT1 TAPERPNT 27 (SUTURE) ×3 IMPLANT
SYR CONTROL 10ML LL (SYRINGE) ×4 IMPLANT
TIBIA ATTUNE KNEE SYS BASE SZ6 (Knees) ×2 IMPLANT
TRAY FOLEY MTR SLVR 14FR STAT (SET/KITS/TRAYS/PACK) ×1 IMPLANT
TRAY FOLEY MTR SLVR 16FR STAT (SET/KITS/TRAYS/PACK) ×1 IMPLANT
WATER STERILE IRR 1000ML POUR (IV SOLUTION) ×4 IMPLANT
WRAP KNEE MAXI GEL POST OP (GAUZE/BANDAGES/DRESSINGS) ×1 IMPLANT
YANKAUER SUCT BULB TIP 10FT TU (MISCELLANEOUS) ×2 IMPLANT

## 2020-01-24 NOTE — Anesthesia Procedure Notes (Signed)
Procedure Name: MAC Date/Time: 01/24/2020 7:20 AM Performed by: Eben Burow, CRNA Pre-anesthesia Checklist: Patient identified, Emergency Drugs available, Suction available, Patient being monitored and Timeout performed Oxygen Delivery Method: Simple face mask Dental Injury: Teeth and Oropharynx as per pre-operative assessment

## 2020-01-24 NOTE — Op Note (Signed)
PATIENT ID:      Jordan [name]  MRN:     [id number] DOB/AGE:    1961-10-22 / 59 y.o.       OPERATIVE REPORT   DATE OF PROCEDURE:  01/24/2020      PREOPERATIVE DIAGNOSIS:   LEFT KNEE OSTEOARTHRITIS      Estimated body mass index is 31.18 kg/m as calculated from the following:   Height as of this encounter: 5' 5.5" (1.664 m).   Weight as of this encounter: 86.3 kg.                                                       POSTOPERATIVE DIAGNOSIS:   LEFT KNEE OSTEOARTHRITIS                                                                       PROCEDURE:  Procedure(s): LEFT TOTAL KNEE ARTHROPLASTY Using DepuyAttune RP implants #6LN Femur, #6Tibia, 5 mm Attune RP bearing, 38 Patella    SURGEON: Kerin Salen  ASSISTANT:   Kerry Hough. [employer]   (Present and scrubbed throughout the case, critical for assistance with exposure, retraction, instrumentation, and closure.)        ANESTHESIA: Spinal, 20cc Exparel, 50cc 0.25% Marcaine EBL: 300 cc FLUID REPLACEMENT: 1500 cc crystaloid TOURNIQUET: DRAINS: None TRANEXAMIC ACID: 1gm IV, 2gm topical COMPLICATIONS:  None         INDICATIONS FOR PROCEDURE: The patient has  LEFT KNEE OSTEOARTHRITIS, Var deformities, XR shows bone on bone arthritis, lateral subluxation of tibia. Patient has failed all conservative measures including anti-inflammatory medicines, narcotics, attempts at exercise and weight loss, cortisone injections and viscosupplementation.  Risks and benefits of surgery have been discussed, questions answered.   DESCRIPTION OF PROCEDURE: The patient identified by armband, received  IV antibiotics, in the holding area at [facility]. Patient taken to the operating room, appropriate anesthetic monitors were attached, and Spinal anesthesia was  induced. IV Tranexamic acid was given.Tourniquet applied high to the operative thigh. Lateral post and foot positioner applied to the table, the lower extremity was then prepped and draped in  usual sterile fashion from the toes to the tourniquet. Time-out procedure was performed. Kerry Hough. [facility] was present and scrubbed throughout the case, critical for assistance with, positioning, exposure, retraction, instrumentation, and closure. The skin and subcutaneous tissue along the incision was injected with 20 cc of a mixture of Exparel and Marcaine solution, using a 20-gauge by 1-1/2 inch needle. We began the operation, with the knee flexed 130 degrees, by making the anterior midline incision starting at handbreadth above the patella going over the patella 1 cm medial to and 4 cm distal to the tibial tubercle. Small bleeders in the skin and the subcutaneous tissue identified and cauterized. Transverse retinaculum was incised and reflected medially and a medial parapatellar arthrotomy was accomplished. the patella was everted and theprepatellar fat pad resected. The superficial medial collateral ligament was then elevated from anterior to posterior along the proximal flare of the tibia and anterior half of the menisci resected. The knee  was hyperflexed exposing bone on bone arthritis. Peripheral and notch osteophytes as well as the cruciate ligaments were then resected. We continued to work our way around posteriorly along the proximal tibia, and externally rotated the tibia subluxing it out from underneath the femur. A McHale PCL retractor was placed through the notch and a lateral Hohmann retractor placed, and we then entered the proximal tibia in line with the Depuy starter drill in line with the axis of the tibia followed by an intramedullary guide rod and 0-degree posterior slope cutting guide. The tibial cutting guide, 4 degree posterior sloped, was pinned into place allowing resection of 5 mm of bone medially and 10 mm of bone laterally. Satisfied with the tibial resection, we then entered the distal femur 2 mm anterior to the PCL origin with the intramedullary guide rod and applied the distal  femoral cutting guide set at 9 mm, with 5 degrees of valgus. This was pinned along the epicondylar axis. At this point, the distal femoral cut was accomplished without difficulty. We then sized for a #6 femoral component and pinned the guide in 3 degrees of external rotation. The chamfer cutting guide was pinned into place. The anterior, posterior, and chamfer cuts were accomplished without difficulty followed by the Attune RP box cutting guide and the box cut. We also removed posterior osteophytes from the posterior femoral condyles. The posterior capsule was injected with Exparel solution. The knee was brought into full extension. We checked our extension gap and fit a 5 mm bearing. Distracting in extension with a lamina spreader,  bleeders in the posterior capsule, Posterior medial and posterior lateral gutter were cauterized.  The transexamic acid-soaked sponge was then placed in the gap of the knee in extension. The knee was flexed 30. The posterior patella cut was accomplished with the 9.5 mm Attune cutting guide, sized for a 44mm dome, and the fixation pegs drilled.The knee was then once again hyperflexed exposing the proximal tibia. We sized for a # 6 tibial base plate, applied the smokestack and the conical reamer followed by the the Delta fin keel punch. We then hammered into place the Attune RP trial femoral component, drilled the lugs, inserted a  5 mm trial bearing, trial patellar button, and took the knee through range of motion from 0-130 degrees. Medial and lateral ligamentous stability was checked. No thumb pressure was required for patellar Tracking. The tourniquet was not used. All trial components were removed, mating surfaces irrigated with pulse lavage, and dried with suction and sponges. 10 cc of the Exparel solution was applied to the cancellus bone of the patella distal femur and proximal tibia.  After waiting 30 seconds, the bony surfaces were again, dried with sponges. A double batch of  DePuy HV cement was mixed and applied to all bony metallic mating surfaces except for the posterior condyles of the femur itself. In order, we hammered into place the tibial tray and removed excess cement, the femoral component and removed excess cement. The final Attune RP bearing was inserted, and the knee brought to full extension with compression. The patellar button was clamped into place, and excess cement removed. The knee was held at 30 flexion with compression, while the cement cured. The wound was irrigated out with normal saline solution pulse lavage. The rest of the Exparel was injected into the parapatellar arthrotomy, subcutaneous tissues, and periosteal tissues. The parapatellar arthrotomy was closed with running #1 Vicryl suture. The subcutaneous tissue with 0 and 2-0 undyed Vicryl suture, and  the skin with running 3-0 SQ vicryl. An Aquacil and Ace wrap were applied. The patient was taken to recovery room without difficulty.   Kerin Salen 01/24/2020, 8:34 AM

## 2020-01-24 NOTE — Interval H&P Note (Signed)
History and Physical Interval Note:  01/24/2020 6:48 AM  Jordan [name]  has presented today for surgery, with the diagnosis of LEFT KNEE OSTEOARTHRITIS.  The various methods of treatment have been discussed with the patient and family. After consideration of risks, benefits and other options for treatment, the patient has consented to  Procedure(s): LEFT TOTAL KNEE ARTHROPLASTY (Left) as a surgical intervention.  The patient's history has been reviewed, patient examined, no change in status, stable for surgery.  I have reviewed the patient's chart and labs.  Questions were answered to the patient's satisfaction.     Kerin Salen

## 2020-01-24 NOTE — Anesthesia Procedure Notes (Signed)
Anesthesia Regional Block: Adductor canal block   Pre-Anesthetic Checklist: ,, timeout performed, Correct Patient, Correct Site, Correct Laterality, Correct Procedure, Correct Position, site marked, Risks and benefits discussed,  Surgical consent,  Pre-op evaluation,  At surgeon's request and post-op pain management  Laterality: Left  Prep: chloraprep       Needles:  Injection technique: Single-shot  Needle Type: Echogenic Needle     Needle Length: 10cm  Needle Gauge: 21     Additional Needles:   Narrative:  Start time: 01/24/2020 6:54 AM End time: 01/24/2020 6:57 AM Injection made incrementally with aspirations every 5 mL.  Performed by: Personally  Anesthesiologist: Audry Pili, MD  Additional Notes: No pain on injection. No increased resistance to injection. Injection made in 5cc increments. Good needle visualization. Patient tolerated the procedure well.

## 2020-01-24 NOTE — Anesthesia Procedure Notes (Signed)
Spinal  Patient location during procedure: OR Start time: 01/24/2020 7:19 AM End time: 01/24/2020 7:22 AM Staffing Performed: anesthesiologist  Anesthesiologist: Audry Pili, MD Preanesthetic Checklist Completed: patient identified, IV checked, risks and benefits discussed, surgical consent, monitors and equipment checked, pre-op evaluation and timeout performed Spinal Block Patient position: sitting Prep: DuraPrep Patient monitoring: heart rate, cardiac monitor, continuous pulse ox and blood pressure Approach: midline Location: L2-3 Injection technique: single-shot Needle Needle type: Pencan  Needle gauge: 24 G Additional Notes Consent was obtained prior to the procedure with all questions answered and concerns addressed. Risks including, but not limited to, bleeding, infection, nerve damage, paralysis, failed block, inadequate analgesia, allergic reaction, high spinal, itching, and headache were discussed and the patient wished to proceed. Functioning IV was confirmed and monitors were applied. Sterile prep and drape, including hand hygiene, mask, and sterile gloves were used. The patient was positioned and the spine was prepped. The skin was anesthetized with lidocaine. Free flow of clear CSF was obtained prior to injecting local anesthetic into the CSF. The spinal needle aspirated freely following injection. The needle was carefully withdrawn. The patient tolerated the procedure well.   Renold Don, MD

## 2020-01-24 NOTE — Anesthesia Postprocedure Evaluation (Signed)
Anesthesia Post Note  Patient: [name]  Procedure(s) Performed: LEFT TOTAL KNEE ARTHROPLASTY (Left Knee)     Patient location during evaluation: PACU Anesthesia Type: Spinal Level of consciousness: awake and alert Pain management: pain level controlled Vital Signs Assessment: post-procedure vital signs reviewed and stable Respiratory status: spontaneous breathing and respiratory function stable Cardiovascular status: blood pressure returned to baseline and stable Postop Assessment: spinal receding and no apparent nausea or vomiting Anesthetic complications: no    Last Vitals:  Vitals:   01/24/20 1000 01/24/20 1009  BP: (!) 152/76 (!) 149/67  Pulse: 72 74  Resp: 13 18  Temp: 36.9 C 36.9 C  SpO2: 91% 93%    Last Pain:  Vitals:   01/24/20 1009  TempSrc:   PainSc: 0-No pain                 Audry Pili

## 2020-01-24 NOTE — Discharge Instructions (Addendum)

## 2020-01-24 NOTE — Evaluation (Signed)
Physical Therapy Evaluation Patient Details Name: Jordan [name] MRN: [id number] DOB: 1961/02/06 Today's Date: 01/24/2020   History of Present Illness  Patient is 59 y.o. female s/p Lt TKA on 01/24/20 with PMH significant for Rt TKA in 2014, CHF, OA, HTN.  Clinical Impression  Jordan [name] is a 59 y.o. female POD 0 s/p Lt TKA. Patient reports independence with mobility at baseline. Patient is now limited by functional impairments (see PT problem list below) and requires supervision for transfers and gait with RW. Patient was able to ambulate ~120 feet with RW and supervision and cues for safe step pattern initially and safe walker management. Patient instructed in exercises to facilitate ROM and circulation. Patient will benefit from continued skilled PT interventions to address impairments and progress towards PLOF. Patient has met mobility goals at adequate level for discharge home; will continue to follow if pt continues acute stay to progress towards Mod I goals.     Follow Up Recommendations Follow surgeon's recommendation for DC plan and follow-up therapies    Equipment Recommendations  None recommended by PT    Recommendations for Other Services       Precautions / Restrictions Precautions Precautions: Fall Restrictions Weight Bearing Restrictions: No      Mobility  Bed Mobility Overal bed mobility: Modified Independent        General bed mobility comments: pt with increased time, no assist or cues required  Transfers Overall transfer level: Needs assistance Equipment used: Rolling walker (2 wheeled) Transfers: Sit to/from Stand Sit to Stand: Supervision         General transfer comment: pt with safe hand placement on RW, no assist required for power up from EOB or toilet.  Ambulation/Gait Ambulation/Gait assistance: Supervision Gait Distance (Feet): 120 Feet Assistive device: Rolling walker (2 wheeled) Gait Pattern/deviations: Step-through  pattern;Step-to pattern;Decreased stride length;Decreased step length - right;Decreased stance time - left Gait velocity: slight decrease from normal limits Gait velocity interpretation: 1.31 - 2.62 ft/sec, indicative of limited community ambulator General Gait Details: cues for safe step pattern initially and pt maintained throughout, no overt LOB. pt able to unweight Lt LE to prevent buckling and manage pain.  Stairs Stairs: (pt does not have stairs at home)          Wheelchair Mobility    Modified Rankin (Stroke Patients Only)       Balance Overall balance assessment: Needs assistance   Sitting balance-Leahy Scale: Good     Standing balance support: During functional activity;Bilateral upper extremity supported Standing balance-Leahy Scale: Fair              Pertinent Vitals/Pain Pain Assessment: 0-10 Pain Score: 5  Pain Location: Lt knee Pain Descriptors / Indicators: Burning;Aching;Sore Pain Intervention(s): Limited activity within patient's tolerance;Monitored during session;Repositioned    Home Living Family/patient expects to be discharged to:: Private residence Living Arrangements: Spouse/significant other Available Help at Discharge: Family;Available 24 hours/day Type of Home: House Home Access: Ramped entrance     Home Layout: One level Home Equipment: Bedside commode;Walker - 2 wheels;Cane - single point;Crutches Additional Comments: pt can fit BSC in shower as shower seat    Prior Function Level of Independence: Independent           Hand Dominance   Dominant Hand: Right    Extremity/Trunk Assessment   Upper Extremity Assessment Upper Extremity Assessment: Overall WFL for tasks assessed    Lower Extremity Assessment Lower Extremity Assessment: LLE deficits/detail;Overall [facility] for tasks assessed LLE Deficits /  Details: good quad activation, no extensor lag with SLR, no buckling in standing LLE Sensation: WNL LLE Coordination: WNL     Cervical / Trunk Assessment Cervical / Trunk Assessment: Normal  Communication   Communication: No difficulties  Cognition Arousal/Alertness: Awake/alert Behavior During Therapy: WFL for tasks assessed/performed Overall Cognitive Status: Within Functional Limits for tasks assessed         General Comments      Exercises Total Joint Exercises Ankle Circles/Pumps: AROM;10 reps;Seated;Both Quad Sets: AROM;5 reps;Seated;Left Short Arc Quad: AROM;5 reps;Seated;Left Heel Slides: AROM;AAROM;5 reps;Seated;Left Hip ABduction/ADduction: AROM;5 reps;Seated;Left Straight Leg Raises: AROM;5 reps;Seated;Left Long Arc Quad: AROM;5 reps;Seated;Left Knee Flexion: AROM;AAROM;5 reps;Seated;Left   Assessment/Plan    PT Assessment Patient needs continued PT services  PT Problem List Decreased strength       PT Treatment Interventions DME instruction;Gait training;Therapeutic activities;Functional mobility training;Balance training;Patient/family education;Therapeutic exercise    PT Goals (Current goals can be found in the Care Plan section)  Acute Rehab PT Goals Patient Stated Goal: to get back home PT Goal Formulation: With patient Time For Goal Achievement: 01/31/20 Potential to Achieve Goals: Good    Frequency 7X/week    AM-PAC PT "6 Clicks" Mobility  Outcome Measure Help needed turning from your back to your side while in a flat bed without using bedrails?: None Help needed moving from lying on your back to sitting on the side of a flat bed without using bedrails?: None Help needed moving to and from a bed to a chair (including a wheelchair)?: A Little Help needed standing up from a chair using your arms (e.g., wheelchair or bedside chair)?: A Little Help needed to walk in hospital room?: A Little Help needed climbing 3-5 steps with a railing? : A Little 6 Click Score: 20    End of Session Equipment Utilized During Treatment: Gait belt Activity Tolerance: Patient tolerated  treatment well Patient left: with call bell/phone within reach;in chair Nurse Communication: Mobility status PT Visit Diagnosis: Muscle weakness (generalized) (M62.81);Difficulty in walking, not elsewhere classified (R26.2)    Time: 5374-8270 PT Time Calculation (min) (ACUTE ONLY): 30 min   Charges:   PT Evaluation $PT Eval Low Complexity: 1 Low PT Treatments $Therapeutic Exercise: 8-22 mins        Verner Mould, DPT Physical Therapist with [facility] [phone]  01/24/2020 12:28 PM

## 2020-01-24 NOTE — Transfer of Care (Signed)
Immediate Anesthesia Transfer of Care Note  Patient: Jordan [name]  Procedure(s) Performed: LEFT TOTAL KNEE ARTHROPLASTY (Left Knee)  Patient Location: PACU  Anesthesia Type:Spinal  Level of Consciousness: awake, alert  and oriented  Airway & Oxygen Therapy: Patient Spontanous Breathing and Patient connected to face mask oxygen  Post-op Assessment: Report given to RN and Post -op Vital signs reviewed and stable  Post vital signs: Reviewed and stable  Last Vitals:  Vitals Value Taken Time  BP    Temp    Pulse    Resp    SpO2      Last Pain:  Vitals:   01/24/20 0623  TempSrc:   PainSc: 4       Patients Stated Pain Goal: 3 ([id number])  Complications: No apparent anesthesia complications

## 2020-01-25 ENCOUNTER — Encounter: Payer: Self-pay | Admitting: *Deleted

## 2020-01-26 DIAGNOSIS — I5023 Acute on chronic systolic (congestive) heart failure: Secondary | ICD-10-CM | POA: Diagnosis not present

## 2020-01-26 DIAGNOSIS — Z7982 Long term (current) use of aspirin: Secondary | ICD-10-CM | POA: Diagnosis not present

## 2020-01-26 DIAGNOSIS — I110 Hypertensive heart disease with heart failure: Secondary | ICD-10-CM | POA: Diagnosis not present

## 2020-01-26 DIAGNOSIS — Z96653 Presence of artificial knee joint, bilateral: Secondary | ICD-10-CM | POA: Diagnosis not present

## 2020-01-26 DIAGNOSIS — Z471 Aftercare following joint replacement surgery: Secondary | ICD-10-CM | POA: Diagnosis not present

## 2020-01-26 DIAGNOSIS — I058 Other rheumatic mitral valve diseases: Secondary | ICD-10-CM | POA: Diagnosis not present

## 2020-01-26 DIAGNOSIS — Z955 Presence of coronary angioplasty implant and graft: Secondary | ICD-10-CM | POA: Diagnosis not present

## 2020-01-26 DIAGNOSIS — R7303 Prediabetes: Secondary | ICD-10-CM | POA: Diagnosis not present

## 2020-01-26 DIAGNOSIS — I051 Rheumatic mitral insufficiency: Secondary | ICD-10-CM | POA: Diagnosis not present

## 2020-01-26 DIAGNOSIS — Z87891 Personal history of nicotine dependence: Secondary | ICD-10-CM | POA: Diagnosis not present

## 2020-01-26 DIAGNOSIS — Z9181 History of falling: Secondary | ICD-10-CM | POA: Diagnosis not present

## 2020-01-28 DIAGNOSIS — Z7982 Long term (current) use of aspirin: Secondary | ICD-10-CM | POA: Diagnosis not present

## 2020-01-28 DIAGNOSIS — I051 Rheumatic mitral insufficiency: Secondary | ICD-10-CM | POA: Diagnosis not present

## 2020-01-28 DIAGNOSIS — R7303 Prediabetes: Secondary | ICD-10-CM | POA: Diagnosis not present

## 2020-01-28 DIAGNOSIS — I058 Other rheumatic mitral valve diseases: Secondary | ICD-10-CM | POA: Diagnosis not present

## 2020-01-28 DIAGNOSIS — Z471 Aftercare following joint replacement surgery: Secondary | ICD-10-CM | POA: Diagnosis not present

## 2020-01-28 DIAGNOSIS — I110 Hypertensive heart disease with heart failure: Secondary | ICD-10-CM | POA: Diagnosis not present

## 2020-01-28 DIAGNOSIS — Z955 Presence of coronary angioplasty implant and graft: Secondary | ICD-10-CM | POA: Diagnosis not present

## 2020-01-28 DIAGNOSIS — Z9181 History of falling: Secondary | ICD-10-CM | POA: Diagnosis not present

## 2020-01-28 DIAGNOSIS — Z87891 Personal history of nicotine dependence: Secondary | ICD-10-CM | POA: Diagnosis not present

## 2020-01-28 DIAGNOSIS — I5023 Acute on chronic systolic (congestive) heart failure: Secondary | ICD-10-CM | POA: Diagnosis not present

## 2020-01-28 DIAGNOSIS — Z96653 Presence of artificial knee joint, bilateral: Secondary | ICD-10-CM | POA: Diagnosis not present

## 2020-02-01 DIAGNOSIS — Z7982 Long term (current) use of aspirin: Secondary | ICD-10-CM | POA: Diagnosis not present

## 2020-02-01 DIAGNOSIS — I5023 Acute on chronic systolic (congestive) heart failure: Secondary | ICD-10-CM | POA: Diagnosis not present

## 2020-02-01 DIAGNOSIS — R7303 Prediabetes: Secondary | ICD-10-CM | POA: Diagnosis not present

## 2020-02-01 DIAGNOSIS — Z9181 History of falling: Secondary | ICD-10-CM | POA: Diagnosis not present

## 2020-02-01 DIAGNOSIS — I058 Other rheumatic mitral valve diseases: Secondary | ICD-10-CM | POA: Diagnosis not present

## 2020-02-01 DIAGNOSIS — Z96653 Presence of artificial knee joint, bilateral: Secondary | ICD-10-CM | POA: Diagnosis not present

## 2020-02-01 DIAGNOSIS — I110 Hypertensive heart disease with heart failure: Secondary | ICD-10-CM | POA: Diagnosis not present

## 2020-02-01 DIAGNOSIS — Z471 Aftercare following joint replacement surgery: Secondary | ICD-10-CM | POA: Diagnosis not present

## 2020-02-01 DIAGNOSIS — Z87891 Personal history of nicotine dependence: Secondary | ICD-10-CM | POA: Diagnosis not present

## 2020-02-01 DIAGNOSIS — Z955 Presence of coronary angioplasty implant and graft: Secondary | ICD-10-CM | POA: Diagnosis not present

## 2020-02-01 DIAGNOSIS — I051 Rheumatic mitral insufficiency: Secondary | ICD-10-CM | POA: Diagnosis not present

## 2020-02-03 DIAGNOSIS — I110 Hypertensive heart disease with heart failure: Secondary | ICD-10-CM | POA: Diagnosis not present

## 2020-02-03 DIAGNOSIS — Z9181 History of falling: Secondary | ICD-10-CM | POA: Diagnosis not present

## 2020-02-03 DIAGNOSIS — Z96653 Presence of artificial knee joint, bilateral: Secondary | ICD-10-CM | POA: Diagnosis not present

## 2020-02-03 DIAGNOSIS — Z7982 Long term (current) use of aspirin: Secondary | ICD-10-CM | POA: Diagnosis not present

## 2020-02-03 DIAGNOSIS — R7303 Prediabetes: Secondary | ICD-10-CM | POA: Diagnosis not present

## 2020-02-03 DIAGNOSIS — Z955 Presence of coronary angioplasty implant and graft: Secondary | ICD-10-CM | POA: Diagnosis not present

## 2020-02-03 DIAGNOSIS — I5023 Acute on chronic systolic (congestive) heart failure: Secondary | ICD-10-CM | POA: Diagnosis not present

## 2020-02-03 DIAGNOSIS — I051 Rheumatic mitral insufficiency: Secondary | ICD-10-CM | POA: Diagnosis not present

## 2020-02-03 DIAGNOSIS — I058 Other rheumatic mitral valve diseases: Secondary | ICD-10-CM | POA: Diagnosis not present

## 2020-02-03 DIAGNOSIS — Z87891 Personal history of nicotine dependence: Secondary | ICD-10-CM | POA: Diagnosis not present

## 2020-02-03 DIAGNOSIS — Z471 Aftercare following joint replacement surgery: Secondary | ICD-10-CM | POA: Diagnosis not present

## 2020-02-04 ENCOUNTER — Other Ambulatory Visit: Payer: Self-pay | Admitting: Cardiology

## 2020-02-04 DIAGNOSIS — I5022 Chronic systolic (congestive) heart failure: Secondary | ICD-10-CM

## 2020-02-04 DIAGNOSIS — I058 Other rheumatic mitral valve diseases: Secondary | ICD-10-CM | POA: Diagnosis not present

## 2020-02-04 DIAGNOSIS — R7303 Prediabetes: Secondary | ICD-10-CM | POA: Diagnosis not present

## 2020-02-04 DIAGNOSIS — I5023 Acute on chronic systolic (congestive) heart failure: Secondary | ICD-10-CM | POA: Diagnosis not present

## 2020-02-04 DIAGNOSIS — Z87891 Personal history of nicotine dependence: Secondary | ICD-10-CM | POA: Diagnosis not present

## 2020-02-04 DIAGNOSIS — I110 Hypertensive heart disease with heart failure: Secondary | ICD-10-CM | POA: Diagnosis not present

## 2020-02-04 DIAGNOSIS — Z955 Presence of coronary angioplasty implant and graft: Secondary | ICD-10-CM | POA: Diagnosis not present

## 2020-02-04 DIAGNOSIS — Z471 Aftercare following joint replacement surgery: Secondary | ICD-10-CM | POA: Diagnosis not present

## 2020-02-04 DIAGNOSIS — I051 Rheumatic mitral insufficiency: Secondary | ICD-10-CM | POA: Diagnosis not present

## 2020-02-04 DIAGNOSIS — Z9181 History of falling: Secondary | ICD-10-CM | POA: Diagnosis not present

## 2020-02-04 DIAGNOSIS — Z7982 Long term (current) use of aspirin: Secondary | ICD-10-CM | POA: Diagnosis not present

## 2020-02-04 DIAGNOSIS — Z96653 Presence of artificial knee joint, bilateral: Secondary | ICD-10-CM | POA: Diagnosis not present

## 2020-02-05 ENCOUNTER — Other Ambulatory Visit: Payer: Self-pay | Admitting: Neurology

## 2020-02-08 DIAGNOSIS — I051 Rheumatic mitral insufficiency: Secondary | ICD-10-CM | POA: Diagnosis not present

## 2020-02-08 DIAGNOSIS — R7303 Prediabetes: Secondary | ICD-10-CM | POA: Diagnosis not present

## 2020-02-08 DIAGNOSIS — I058 Other rheumatic mitral valve diseases: Secondary | ICD-10-CM | POA: Diagnosis not present

## 2020-02-08 DIAGNOSIS — Z96653 Presence of artificial knee joint, bilateral: Secondary | ICD-10-CM | POA: Diagnosis not present

## 2020-02-08 DIAGNOSIS — Z09 Encounter for follow-up examination after completed treatment for conditions other than malignant neoplasm: Secondary | ICD-10-CM | POA: Diagnosis not present

## 2020-02-08 DIAGNOSIS — Z9181 History of falling: Secondary | ICD-10-CM | POA: Diagnosis not present

## 2020-02-08 DIAGNOSIS — Z471 Aftercare following joint replacement surgery: Secondary | ICD-10-CM | POA: Diagnosis not present

## 2020-02-08 DIAGNOSIS — I110 Hypertensive heart disease with heart failure: Secondary | ICD-10-CM | POA: Diagnosis not present

## 2020-02-08 DIAGNOSIS — Z87891 Personal history of nicotine dependence: Secondary | ICD-10-CM | POA: Diagnosis not present

## 2020-02-08 DIAGNOSIS — Z96652 Presence of left artificial knee joint: Secondary | ICD-10-CM | POA: Diagnosis not present

## 2020-02-08 DIAGNOSIS — Z7982 Long term (current) use of aspirin: Secondary | ICD-10-CM | POA: Diagnosis not present

## 2020-02-08 DIAGNOSIS — I5023 Acute on chronic systolic (congestive) heart failure: Secondary | ICD-10-CM | POA: Diagnosis not present

## 2020-02-08 DIAGNOSIS — Z955 Presence of coronary angioplasty implant and graft: Secondary | ICD-10-CM | POA: Diagnosis not present

## 2020-02-09 DIAGNOSIS — Z9181 History of falling: Secondary | ICD-10-CM | POA: Diagnosis not present

## 2020-02-09 DIAGNOSIS — I110 Hypertensive heart disease with heart failure: Secondary | ICD-10-CM | POA: Diagnosis not present

## 2020-02-09 DIAGNOSIS — I5023 Acute on chronic systolic (congestive) heart failure: Secondary | ICD-10-CM | POA: Diagnosis not present

## 2020-02-09 DIAGNOSIS — I058 Other rheumatic mitral valve diseases: Secondary | ICD-10-CM | POA: Diagnosis not present

## 2020-02-09 DIAGNOSIS — Z955 Presence of coronary angioplasty implant and graft: Secondary | ICD-10-CM | POA: Diagnosis not present

## 2020-02-09 DIAGNOSIS — Z7982 Long term (current) use of aspirin: Secondary | ICD-10-CM | POA: Diagnosis not present

## 2020-02-09 DIAGNOSIS — Z471 Aftercare following joint replacement surgery: Secondary | ICD-10-CM | POA: Diagnosis not present

## 2020-02-09 DIAGNOSIS — Z87891 Personal history of nicotine dependence: Secondary | ICD-10-CM | POA: Diagnosis not present

## 2020-02-09 DIAGNOSIS — I051 Rheumatic mitral insufficiency: Secondary | ICD-10-CM | POA: Diagnosis not present

## 2020-02-09 DIAGNOSIS — Z96653 Presence of artificial knee joint, bilateral: Secondary | ICD-10-CM | POA: Diagnosis not present

## 2020-02-09 DIAGNOSIS — R7303 Prediabetes: Secondary | ICD-10-CM | POA: Diagnosis not present

## 2020-02-15 DIAGNOSIS — Z96652 Presence of left artificial knee joint: Secondary | ICD-10-CM | POA: Diagnosis not present

## 2020-02-15 DIAGNOSIS — M25662 Stiffness of left knee, not elsewhere classified: Secondary | ICD-10-CM | POA: Diagnosis not present

## 2020-02-17 DIAGNOSIS — Z96652 Presence of left artificial knee joint: Secondary | ICD-10-CM | POA: Diagnosis not present

## 2020-02-17 DIAGNOSIS — M25662 Stiffness of left knee, not elsewhere classified: Secondary | ICD-10-CM | POA: Diagnosis not present

## 2020-02-22 DIAGNOSIS — Z96652 Presence of left artificial knee joint: Secondary | ICD-10-CM | POA: Diagnosis not present

## 2020-02-22 DIAGNOSIS — M25662 Stiffness of left knee, not elsewhere classified: Secondary | ICD-10-CM | POA: Diagnosis not present

## 2020-02-28 ENCOUNTER — Other Ambulatory Visit: Payer: Self-pay

## 2020-02-28 ENCOUNTER — Ambulatory Visit (HOSPITAL_BASED_OUTPATIENT_CLINIC_OR_DEPARTMENT_OTHER)
Admission: RE | Admit: 2020-02-28 | Discharge: 2020-02-28 | Disposition: A | Discharge status: Home / Self Care | Payer: Federal, State, Local not specified - PPO | Source: Ambulatory Visit | Attending: Cardiology | Admitting: Cardiology

## 2020-02-28 ENCOUNTER — Ambulatory Visit (HOSPITAL_COMMUNITY)
Admission: RE | Admit: 2020-02-28 | Discharge: 2020-02-28 | Disposition: A | Discharge status: Home / Self Care | Payer: Federal, State, Local not specified - PPO | Source: Ambulatory Visit | Attending: Cardiology | Admitting: Cardiology

## 2020-02-28 ENCOUNTER — Encounter (HOSPITAL_COMMUNITY): Payer: Self-pay | Admitting: Cardiology

## 2020-02-28 VITALS — BP 160/70 | HR 81 | Wt 183.4 lb

## 2020-02-28 DIAGNOSIS — Z8249 Family history of ischemic heart disease and other diseases of the circulatory system: Secondary | ICD-10-CM | POA: Diagnosis not present

## 2020-02-28 DIAGNOSIS — Z79899 Other long term (current) drug therapy: Secondary | ICD-10-CM | POA: Diagnosis not present

## 2020-02-28 DIAGNOSIS — I428 Other cardiomyopathies: Secondary | ICD-10-CM | POA: Insufficient documentation

## 2020-02-28 DIAGNOSIS — I5022 Chronic systolic (congestive) heart failure: Secondary | ICD-10-CM | POA: Insufficient documentation

## 2020-02-28 DIAGNOSIS — Z7982 Long term (current) use of aspirin: Secondary | ICD-10-CM | POA: Insufficient documentation

## 2020-02-28 DIAGNOSIS — E119 Type 2 diabetes mellitus without complications: Secondary | ICD-10-CM | POA: Diagnosis not present

## 2020-02-28 DIAGNOSIS — E875 Hyperkalemia: Secondary | ICD-10-CM | POA: Insufficient documentation

## 2020-02-28 DIAGNOSIS — Z7984 Long term (current) use of oral hypoglycemic drugs: Secondary | ICD-10-CM | POA: Insufficient documentation

## 2020-02-28 DIAGNOSIS — I110 Hypertensive heart disease with heart failure: Secondary | ICD-10-CM | POA: Diagnosis not present

## 2020-02-28 DIAGNOSIS — R002 Palpitations: Secondary | ICD-10-CM | POA: Insufficient documentation

## 2020-02-28 DIAGNOSIS — E785 Hyperlipidemia, unspecified: Secondary | ICD-10-CM | POA: Diagnosis not present

## 2020-02-28 LAB — BASIC METABOLIC PANEL
Anion gap: 12 (ref 5–15)
BUN: 7 mg/dL (ref 6–20)
CO2: 30 mmol/L (ref 22–32)
Calcium: 10 mg/dL (ref 8.9–10.3)
Chloride: 101 mmol/L (ref 98–111)
Creatinine, Ser: 0.8 mg/dL (ref 0.44–1.00)
GFR calc Af Amer: >60 mL/min (ref >60)
GFR calc non Af Amer: >60 mL/min (ref >60)
Glucose, Bld: 111 mg/dL — ABNORMAL HIGH (ref 70–99)
Potassium: 3.2 mmol/L — ABNORMAL LOW (ref 3.5–5.1)
Sodium: 143 mmol/L (ref 135–145)

## 2020-02-28 LAB — ECHOCARDIOGRAM COMPLETE

## 2020-02-28 MED ORDER — AMLODIPINE BESYLATE 5 MG PO TABS: 5.0000 mg | ORAL_TABLET | Freq: Every day | ORAL | 3 refills | Status: DC

## 2020-02-28 NOTE — Progress Notes (Signed)
Echocardiogram 2D Echocardiogram has been performed.  Oneal Deputy Oceanna Arruda 02/28/2020, 1:32 PM

## 2020-02-28 NOTE — Patient Instructions (Signed)
START Amlodipine 5mg  (1 tab) daily  Labs today and repeat in 3 months. Appointment scheduled. See next page We will only contact you if something comes back abnormal or we need to make some changes. Otherwise no news is good news!  GARAGE CODE:  June: 5007  Your physician recommends that you schedule a follow-up appointment in: 6 months with Dr Aundra Dubin. Office staff will call you to schedule   Please call office at [phone] option [phone] if you have any questions or concerns.   At the [facility], you and your health needs are our priority. As part of our continuing mission to provide you with exceptional heart care, we have created designated Provider Care Teams. These Care Teams include your primary Cardiologist (physician) and Advanced Practice Providers (APPs- Physician Assistants and Nurse Practitioners) who all work together to provide you with the care you need, when you need it.   You may see any of the following providers on your designated Care Team at your next follow up: Marland Kitchen Dr Glori Bickers . Dr Loralie Champagne . Darrick Grinder, NP . Lyda Jester, PA . Audry Riles, PharmD   Please be sure to bring in all your medications bottles to every appointment.

## 2020-02-28 NOTE — Progress Notes (Signed)
ID:  Jordan [name], DOB [birth year], MRN [id number]   Provider location: [facility] Type of Visit: Established patient   PCP:  Marda Stalker, PA-C  Cardiologist:  Sinclair Grooms, MD HF Cardiology: Dr. Aundra Dubin   History of Present Illness: Jordan [name] is a 59 y.o. female who initially developed dyspnea in 11/18. She had several rounds of antibiotics for presumed bronchitis.  She did not get better, and ended up hospitalized in 3/19 where she was found to have acute systolic CHF.  Echo in 3/19 showed EF 15% with moderate-severe MR.  She was diuresed, and RHC/LHC was done => no coronary disease, low but not markedly low cardiac output.  Since then, she has had another echo in 7/19 that showed EF 25-30% and mild MR.   Cardiac MRI in 3/20 showed LV EF 46%, RV EF 48%, no LGE.  Echo was done today and reviewed, EF up to 50% with mild LVH.   She returns for followup of CHF.  BP high today in the office, generally around [id number] systolic at home.  She can walk around the block without dyspnea.  Some shortness of breath walking up hills or stairs.  No chest pain.  No orthopnea/PND.  Rare palpitations.  She had left TKR in 2/21 and has recovered well so far.     Labs (8/19): pro-BNP 746, hgb 13.7, K 4.9, creatinine 1.01 Labs (9/19): ANA negative, myeloma panel negative, TSH normal, Fe studies normal.  Labs (10/19): K 4.3, creatinine 0.87 Labs (1/20): K 4.3, creatinine 0.88 Labs (3/20): K 4.2, creatinine 0.97 Labs (8/20): K 4.6, creatinine 1.06, TSH normal, hgb 14.1 Labs (2/21): K 4.2, creatinine 0.96  PMH:  1. Hyperlipidemia 2. Left TKR 3. HTN 4. Chronic systolic CHF: Nonischemic cardiomyopathy.  - Echo (3/19): EF 15%, mild LV dilation, mild LVH, moderate-severe mitral regurgitation, PASP 34 mmHg.  - LHC/RHC (4/19): No CAD.  Mean RA 4, PA 31/14, mean PCWP 12, CI 2.1 Fick/2.1 Thermo.   - Echo (7/19): EF 25-30%, severe LV dilation, mild MR.  - CPX (2/20): RER 1.3,  VE/VCO2 27, VO2 15.8 => no significant HF limitation, appears to be limited mainly due to deconditioning.  - Cardiac MRI (3/20): LV EF 46%, RV EF 48%, no LGE.  - Echo (3/21): EF 50%, mild LVH, normal RV 5. Palpitations: Holter (9/19) with occasional PVCs.  6. Type II diabetes  SH: Married, retired from working at [employer], nonsmoker, no ETOH, no drugs.   FH: Father with CABG in 58s.  No history of nonischemic cardiomyopathy.   ROS: All systems reviewed and negative except as per HPI.   Current Outpatient Medications  Medication Sig Dispense Refill  . amoxicillin (AMOXIL) 500 MG tablet Take 4 tablets 30-60 minutes before procedure 4 tablet 0  . aspirin EC 81 MG tablet Take 1 tablet (81 mg total) by mouth 2 (two) times daily. 60 tablet 0  . CALCIUM PO Take 600 mg by mouth daily.     . carvedilol (COREG) 25 MG tablet Take 25 mg by mouth 2 (two) times daily with a meal.    . clonazePAM (KLONOPIN) 0.5 MG tablet Take 0.5 mg by mouth at bedtime.    . furosemide (LASIX) 40 MG tablet Take 1 tablet (40 mg total) by mouth daily. 90 tablet 2  . ipratropium (ATROVENT) 0.06 % nasal spray Place 2 sprays into both nostrils 2 (two) times daily as needed for rhinitis.   0  . metFORMIN (  GLUCOPHAGE) 500 MG tablet Take 500 mg by mouth at bedtime.     . Multiple Vitamins-Minerals (MULTIVITAMIN WITH MINERALS) tablet Take 1 tablet by mouth daily.    Marland Kitchen oxyCODONE-acetaminophen (PERCOCET/ROXICET) 5-325 MG tablet Take 1 tablet by mouth every 4 (four) hours as needed for severe pain. 30 tablet 0  . pravastatin (PRAVACHOL) 80 MG tablet Take 80 mg by mouth daily.    . sacubitril-valsartan (ENTRESTO) 97-103 MG Take 1 tablet by mouth 2 (two) times daily.    . sodium zirconium cyclosilicate (LOKELMA) 10 g PACK packet Take 10 g by mouth daily.    Marland Kitchen spironolactone (ALDACTONE) 25 MG tablet Take 1 tablet (25 mg total) by mouth daily. 90 tablet 3  . tiZANidine (ZANAFLEX) 2 MG tablet Take 1 tablet (2 mg total) by mouth  every 6 (six) hours as needed. 60 tablet 0  . topiramate (TOPAMAX) 25 MG tablet Take 25 mg by mouth daily as needed (for migraines).    . Venlafaxine HCl 225 MG TB24 Take 225 mg by mouth daily.   1  . amLODipine (NORVASC) 5 MG tablet Take 1 tablet (5 mg total) by mouth daily. 90 tablet 3   No current facility-administered medications for this encounter.   BP (!) 160/70   Pulse 81   Wt 83.2 kg (183 lb 6.4 oz)   SpO2 98%   BMI 30.06 kg/m  General: NAD Neck: No JVD, no thyromegaly or thyroid nodule.  Lungs: Clear to auscultation bilaterally with normal respiratory effort. CV: Nondisplaced PMI.  Heart regular S1/S2, no S3/S4, no murmur.  No peripheral edema.  No carotid bruit.  Normal pedal pulses.  Abdomen: Soft, nontender, no hepatosplenomegaly, no distention.  Skin: Intact without lesions or rashes.  Neurologic: Alert and oriented x 3.  Psych: Normal affect. Extremities: No clubbing or cyanosis.  HEENT: Normal.   Assessment/Plan: 1. Chronic systolic CHF: Nonischemic cardiomyopathy.  Last echo in 7/19 with some improvement, EF 25-30%, up from 15% in 3/19.  Cath in 3/19 with no coronary disease.  Symptoms began in 11/19, initially seemed like URI.  Possible viral myocarditis.  No history of ETOH or drugs.  No FH of nonischemic cardiomyopathy.  CPX in 2/20 suggested minimal HF limitation, primarily limited by deconditioning.  Cardiac MRI in 3/20 showed normal RV function, LV EF 46%, no delayed enhancement.  Echo today was reviewed, EF up to 50%.  She is out of range for ICD.  She has NYHA class II symptoms, weight has been stable.  Not volume overloaded on exam.  - Continue Coreg 25 mg bid.   - Continue Entresto 97/103 bid.   - Continue spironolactone 25 daily and Lokelma due to mild hyperkalemia. BMET today.   - Continue Lasix 40 mg daily.  2. HTN: BP high, add amlodipine 5 mg daily.  3. Palpitations: Occasional.  Holter in 9/19 with occasional PVCs.  4. Hyperkalemia: She is now on  Lokelma and has been able to tolerate spironolactone.    Followup in 6 months but will need BMET in 3 months.   Signed, Loralie Champagne 02/28/2020

## 2020-02-29 DIAGNOSIS — M25662 Stiffness of left knee, not elsewhere classified: Secondary | ICD-10-CM | POA: Diagnosis not present

## 2020-02-29 DIAGNOSIS — Z96652 Presence of left artificial knee joint: Secondary | ICD-10-CM | POA: Diagnosis not present

## 2020-03-01 ENCOUNTER — Telehealth (HOSPITAL_COMMUNITY): Payer: Self-pay

## 2020-03-01 DIAGNOSIS — I5022 Chronic systolic (congestive) heart failure: Secondary | ICD-10-CM

## 2020-03-01 NOTE — Telephone Encounter (Signed)
Pt on Lokelma, per Dr Aundra Dubin pt to not start potassium. Will stop Lokelma for now. Will repeat Bmet in 1 week. Appt scheduled. Pt states appreciation.

## 2020-03-01 NOTE — Telephone Encounter (Signed)
-----   Message from Larey Dresser, MD sent at 02/29/2020  3:52 PM EDT ----- Increase total daily K by 20 mEq.  BMET 10 days

## 2020-03-02 DIAGNOSIS — Z96652 Presence of left artificial knee joint: Secondary | ICD-10-CM | POA: Diagnosis not present

## 2020-03-02 DIAGNOSIS — M25662 Stiffness of left knee, not elsewhere classified: Secondary | ICD-10-CM | POA: Diagnosis not present

## 2020-03-06 DIAGNOSIS — Z96652 Presence of left artificial knee joint: Secondary | ICD-10-CM | POA: Diagnosis not present

## 2020-03-06 DIAGNOSIS — M25662 Stiffness of left knee, not elsewhere classified: Secondary | ICD-10-CM | POA: Diagnosis not present

## 2020-03-08 ENCOUNTER — Other Ambulatory Visit: Payer: Self-pay

## 2020-03-08 ENCOUNTER — Ambulatory Visit (HOSPITAL_COMMUNITY)
Admission: RE | Admit: 2020-03-08 | Discharge: 2020-03-08 | Disposition: A | Discharge status: Home / Self Care | Payer: Federal, State, Local not specified - PPO | Source: Ambulatory Visit | Attending: Cardiology | Admitting: Cardiology

## 2020-03-08 DIAGNOSIS — Z96652 Presence of left artificial knee joint: Secondary | ICD-10-CM | POA: Diagnosis not present

## 2020-03-08 DIAGNOSIS — I5022 Chronic systolic (congestive) heart failure: Secondary | ICD-10-CM

## 2020-03-08 DIAGNOSIS — M25662 Stiffness of left knee, not elsewhere classified: Secondary | ICD-10-CM | POA: Diagnosis not present

## 2020-03-08 LAB — BASIC METABOLIC PANEL
Anion gap: 11 (ref 5–15)
BUN: 7 mg/dL (ref 6–20)
CO2: 30 mmol/L (ref 22–32)
Calcium: 10.3 mg/dL (ref 8.9–10.3)
Chloride: 101 mmol/L (ref 98–111)
Creatinine, Ser: 0.82 mg/dL (ref 0.44–1.00)
GFR calc Af Amer: >60 mL/min (ref >60)
GFR calc non Af Amer: >60 mL/min (ref >60)
Glucose, Bld: 100 mg/dL — ABNORMAL HIGH (ref 70–99)
Potassium: 3.6 mmol/L (ref 3.5–5.1)
Sodium: 142 mmol/L (ref 135–145)

## 2020-03-12 ENCOUNTER — Other Ambulatory Visit: Payer: Self-pay | Admitting: Neurology

## 2020-03-23 ENCOUNTER — Other Ambulatory Visit: Payer: Self-pay | Admitting: Neurology

## 2020-03-23 ENCOUNTER — Telehealth: Payer: Self-pay | Admitting: Neurology

## 2020-03-23 ENCOUNTER — Other Ambulatory Visit: Payer: Self-pay

## 2020-03-23 DIAGNOSIS — R519 Headache, unspecified: Secondary | ICD-10-CM

## 2020-03-23 DIAGNOSIS — R270 Ataxia, unspecified: Secondary | ICD-10-CM

## 2020-03-23 DIAGNOSIS — R42 Dizziness and giddiness: Secondary | ICD-10-CM

## 2020-03-23 MED ORDER — DIAZEPAM 5 MG PO TABS: ORAL_TABLET | ORAL | 0 refills | Status: DC

## 2020-03-23 NOTE — Telephone Encounter (Signed)
Patient called and said she is ready to proceed with having an MRI. As a side note, she also said she is claustrophobic.

## 2020-03-23 NOTE — Telephone Encounter (Signed)
MRI of brain with and without contrast for new onset headache in patient over 50, vertigo and ataxia.  I sent in a prescription for a Valium (diazepam) to take 30 to 40 minutes prior to MRI.  She will need a driver due to drowsiness.

## 2020-03-23 NOTE — Progress Notes (Signed)
MRI Brain ordered. Pt aware of referral. [facility] [address]

## 2020-04-03 DIAGNOSIS — R739 Hyperglycemia, unspecified: Secondary | ICD-10-CM | POA: Diagnosis not present

## 2020-04-03 DIAGNOSIS — Z23 Encounter for immunization: Secondary | ICD-10-CM | POA: Diagnosis not present

## 2020-04-03 DIAGNOSIS — I517 Cardiomegaly: Secondary | ICD-10-CM | POA: Diagnosis not present

## 2020-04-03 DIAGNOSIS — E781 Pure hyperglyceridemia: Secondary | ICD-10-CM | POA: Diagnosis not present

## 2020-04-03 DIAGNOSIS — I10 Essential (primary) hypertension: Secondary | ICD-10-CM | POA: Diagnosis not present

## 2020-04-04 ENCOUNTER — Other Ambulatory Visit: Payer: Self-pay | Admitting: Neurology

## 2020-04-22 ENCOUNTER — Ambulatory Visit
Admission: RE | Admit: 2020-04-22 | Discharge: 2020-04-22 | Disposition: A | Discharge status: Home / Self Care | Payer: Federal, State, Local not specified - PPO | Source: Ambulatory Visit | Attending: Neurology | Admitting: Neurology

## 2020-04-22 ENCOUNTER — Other Ambulatory Visit: Payer: Self-pay

## 2020-04-22 DIAGNOSIS — R270 Ataxia, unspecified: Secondary | ICD-10-CM

## 2020-04-22 DIAGNOSIS — R42 Dizziness and giddiness: Secondary | ICD-10-CM

## 2020-04-22 DIAGNOSIS — R519 Headache, unspecified: Secondary | ICD-10-CM

## 2020-04-22 DIAGNOSIS — H538 Other visual disturbances: Secondary | ICD-10-CM | POA: Diagnosis not present

## 2020-04-22 MED ORDER — GADOBENATE DIMEGLUMINE 529 MG/ML IV SOLN
15.0000 mL | Freq: Once | INTRAVENOUS | Status: AC | PRN
  Administered 2020-04-22: 15 mL via INTRAVENOUS

## 2020-05-01 ENCOUNTER — Other Ambulatory Visit: Payer: Self-pay | Admitting: Neurology

## 2020-05-25 ENCOUNTER — Other Ambulatory Visit: Payer: Self-pay | Admitting: Neurology

## 2020-05-30 ENCOUNTER — Other Ambulatory Visit: Payer: Self-pay

## 2020-05-30 ENCOUNTER — Ambulatory Visit (HOSPITAL_COMMUNITY)
Admission: RE | Admit: 2020-05-30 | Discharge: 2020-05-30 | Disposition: A | Discharge status: Home / Self Care | Payer: Federal, State, Local not specified - PPO | Source: Ambulatory Visit | Attending: Cardiology | Admitting: Cardiology

## 2020-05-30 DIAGNOSIS — I5022 Chronic systolic (congestive) heart failure: Secondary | ICD-10-CM

## 2020-05-30 LAB — BASIC METABOLIC PANEL
Anion gap: 8 (ref 5–15)
BUN: 19 mg/dL (ref 6–20)
CO2: 23 mmol/L (ref 22–32)
Calcium: 9.7 mg/dL (ref 8.9–10.3)
Chloride: 106 mmol/L (ref 98–111)
Creatinine, Ser: 1.28 mg/dL — ABNORMAL HIGH (ref 0.44–1.00)
GFR calc Af Amer: 53 mL/min — ABNORMAL LOW (ref >60)
GFR calc non Af Amer: 46 mL/min — ABNORMAL LOW (ref >60)
Glucose, Bld: 93 mg/dL (ref 70–99)
Potassium: 4.7 mmol/L (ref 3.5–5.1)
Sodium: 137 mmol/L (ref 135–145)

## 2020-06-05 ENCOUNTER — Other Ambulatory Visit (HOSPITAL_COMMUNITY): Payer: Self-pay | Admitting: Cardiology

## 2020-06-20 ENCOUNTER — Other Ambulatory Visit: Payer: Self-pay | Admitting: Neurology

## 2020-06-20 DIAGNOSIS — Z96651 Presence of right artificial knee joint: Secondary | ICD-10-CM | POA: Diagnosis not present

## 2020-06-20 DIAGNOSIS — Z96652 Presence of left artificial knee joint: Secondary | ICD-10-CM | POA: Diagnosis not present

## 2020-06-20 DIAGNOSIS — Z471 Aftercare following joint replacement surgery: Secondary | ICD-10-CM | POA: Diagnosis not present

## 2020-07-02 ENCOUNTER — Other Ambulatory Visit (HOSPITAL_COMMUNITY): Payer: Self-pay | Admitting: Adult Health

## 2020-07-13 ENCOUNTER — Other Ambulatory Visit: Payer: Self-pay | Admitting: Neurology

## 2020-08-05 ENCOUNTER — Other Ambulatory Visit: Payer: Self-pay | Admitting: Neurology

## 2020-08-31 ENCOUNTER — Other Ambulatory Visit: Payer: Self-pay | Admitting: Neurology

## 2020-09-27 ENCOUNTER — Other Ambulatory Visit (HOSPITAL_COMMUNITY): Payer: Self-pay | Admitting: Cardiology

## 2020-09-27 ENCOUNTER — Telehealth (HOSPITAL_COMMUNITY): Payer: Self-pay | Admitting: Pharmacy Technician

## 2020-09-27 NOTE — Telephone Encounter (Signed)
Received a message from the patient concerning her Entresto co-pay. Her pharmacy stated that her co-pay was $280. She should have a co-pay card on file.  Called and spoke with the pharmacy and they did not apply the co-pay card. The pharmacist was able to find the co-pay card and bill the claim successfully.  Attempted to call the patient back. There was no answer and the voicemail box is not set up.  Charlann Boxer, CPhT

## 2020-10-30 ENCOUNTER — Other Ambulatory Visit: Payer: Self-pay | Admitting: Cardiology

## 2020-10-30 ENCOUNTER — Other Ambulatory Visit (HOSPITAL_COMMUNITY): Payer: Self-pay | Admitting: Cardiology

## 2020-10-30 DIAGNOSIS — I5022 Chronic systolic (congestive) heart failure: Secondary | ICD-10-CM

## 2020-12-02 ENCOUNTER — Other Ambulatory Visit: Payer: Self-pay | Admitting: Cardiology

## 2020-12-02 DIAGNOSIS — I5022 Chronic systolic (congestive) heart failure: Secondary | ICD-10-CM

## 2020-12-12 ENCOUNTER — Telehealth: Payer: Self-pay | Admitting: Internal Medicine

## 2020-12-12 NOTE — Telephone Encounter (Signed)
Hi Dr. Rhea Belton,   We received a referral from PCP for patient to have a repeat colonoscopy. Patient had one done in 2013 with [facility].   Received records for review.   Please advise on scheduling.   Thank you

## 2020-12-27 ENCOUNTER — Other Ambulatory Visit: Payer: Self-pay | Admitting: Cardiology

## 2020-12-27 ENCOUNTER — Encounter: Payer: Self-pay | Admitting: Internal Medicine

## 2020-12-27 DIAGNOSIS — I5022 Chronic systolic (congestive) heart failure: Secondary | ICD-10-CM

## 2020-12-27 NOTE — Telephone Encounter (Signed)
Called patient to schedule direct colonoscopy per Dr. Hilarie Fredrickson left voicemail

## 2021-01-23 ENCOUNTER — Other Ambulatory Visit: Payer: Self-pay | Admitting: Cardiology

## 2021-01-23 DIAGNOSIS — I5022 Chronic systolic (congestive) heart failure: Secondary | ICD-10-CM

## 2021-01-30 ENCOUNTER — Other Ambulatory Visit (HOSPITAL_COMMUNITY): Payer: Self-pay | Admitting: Cardiology

## 2021-01-31 ENCOUNTER — Encounter (HOSPITAL_COMMUNITY): Payer: Self-pay | Admitting: Cardiology

## 2021-01-31 ENCOUNTER — Ambulatory Visit (HOSPITAL_COMMUNITY)
Admission: RE | Admit: 2021-01-31 | Discharge: 2021-01-31 | Disposition: A | Discharge status: Home / Self Care | Payer: Medicare Other | Source: Ambulatory Visit | Attending: Cardiology | Admitting: Cardiology

## 2021-01-31 ENCOUNTER — Other Ambulatory Visit: Payer: Self-pay

## 2021-01-31 VITALS — BP 130/70 | HR 85 | Wt 168.8 lb

## 2021-01-31 DIAGNOSIS — Z7982 Long term (current) use of aspirin: Secondary | ICD-10-CM | POA: Insufficient documentation

## 2021-01-31 DIAGNOSIS — E875 Hyperkalemia: Secondary | ICD-10-CM | POA: Diagnosis not present

## 2021-01-31 DIAGNOSIS — I428 Other cardiomyopathies: Secondary | ICD-10-CM | POA: Diagnosis not present

## 2021-01-31 DIAGNOSIS — Z8249 Family history of ischemic heart disease and other diseases of the circulatory system: Secondary | ICD-10-CM | POA: Diagnosis not present

## 2021-01-31 DIAGNOSIS — R002 Palpitations: Secondary | ICD-10-CM | POA: Insufficient documentation

## 2021-01-31 DIAGNOSIS — Z79899 Other long term (current) drug therapy: Secondary | ICD-10-CM | POA: Insufficient documentation

## 2021-01-31 DIAGNOSIS — Z7984 Long term (current) use of oral hypoglycemic drugs: Secondary | ICD-10-CM | POA: Insufficient documentation

## 2021-01-31 DIAGNOSIS — I5022 Chronic systolic (congestive) heart failure: Secondary | ICD-10-CM | POA: Diagnosis not present

## 2021-01-31 DIAGNOSIS — I110 Hypertensive heart disease with heart failure: Secondary | ICD-10-CM | POA: Diagnosis not present

## 2021-01-31 LAB — BASIC METABOLIC PANEL
Anion gap: 10 (ref 5–15)
BUN: 15 mg/dL (ref 6–20)
CO2: 22 mmol/L (ref 22–32)
Calcium: 9.6 mg/dL (ref 8.9–10.3)
Chloride: 106 mmol/L (ref 98–111)
Creatinine, Ser: 1.04 mg/dL — ABNORMAL HIGH (ref 0.44–1.00)
GFR, Estimated: >60 mL/min (ref >60)
Glucose, Bld: 111 mg/dL — ABNORMAL HIGH (ref 70–99)
Potassium: 4.9 mmol/L (ref 3.5–5.1)
Sodium: 138 mmol/L (ref 135–145)

## 2021-01-31 LAB — LIPID PANEL
Cholesterol: 190 mg/dL (ref 0–200)
HDL: 40 mg/dL — ABNORMAL LOW (ref >40)
LDL Cholesterol: 100 mg/dL — ABNORMAL HIGH (ref 0–99)
Total CHOL/HDL Ratio: 4.8 RATIO
Triglycerides: 251 mg/dL — ABNORMAL HIGH (ref <150)
VLDL: 50 mg/dL — ABNORMAL HIGH (ref 0–40)

## 2021-01-31 MED ORDER — FUROSEMIDE 40 MG PO TABS: 40.0000 mg | ORAL_TABLET | Freq: Every day | ORAL | 3 refills | Status: DC

## 2021-01-31 NOTE — Progress Notes (Signed)
ID:  Jordan [name], DOB [birth year], MRN [id number]   Provider location: [facility] Type of Visit: Established patient   PCP:  Marda Stalker, PA-C  Cardiologist:  Sinclair Grooms, MD HF Cardiology: Dr. Aundra Dubin   History of Present Illness: Jordan [name] is a 60 y.o. female who initially developed dyspnea in 11/18. She had several rounds of antibiotics for presumed bronchitis.  She did not get better, and ended up hospitalized in 3/19 where she was found to have acute systolic CHF.  Echo in 3/19 showed EF 15% with moderate-severe MR.  She was diuresed, and RHC/LHC was done => no coronary disease, low but not markedly low cardiac output.  Since then, she has had another echo in 7/19 that showed EF 25-30% and mild MR.   Cardiac MRI in 3/20 showed LV EF 46%, RV EF 48%, no LGE.  Echo in 3/21 showed EF up to 50% with mild LVH.   She returns for followup of CHF.  She has been doing well with no significant exertional dyspnea and no chest pain.  Weight is down 15 lbs.  She feels like she needs Lasix and does not want to cut it back.  No orthopnea/PND.  No palpitations.  She is no longer taking Lokelma.     Labs (8/19): pro-BNP 746, hgb 13.7, K 4.9, creatinine 1.01 Labs (9/19): ANA negative, myeloma panel negative, TSH normal, Fe studies normal.  Labs (10/19): K 4.3, creatinine 0.87 Labs (1/20): K 4.3, creatinine 0.88 Labs (3/20): K 4.2, creatinine 0.97 Labs (8/20): K 4.6, creatinine 1.06, TSH normal, hgb 14.1 Labs (2/21): K 4.2, creatinine 0.96 Labs (4/21): LDL 80, TGs 326 Labs (6/21): K 4.7, creatinine 1.28  PMH:  1. Hyperlipidemia 2. Left TKR 3. HTN 4. Chronic systolic CHF: Nonischemic cardiomyopathy.  - Echo (3/19): EF 15%, mild LV dilation, mild LVH, moderate-severe mitral regurgitation, PASP 34 mmHg.  - LHC/RHC (4/19): No CAD.  Mean RA 4, PA 31/14, mean PCWP 12, CI 2.1 Fick/2.1 Thermo.   - Echo (7/19): EF 25-30%, severe LV dilation, mild MR.  - CPX  (2/20): RER 1.3, VE/VCO2 27, VO2 15.8 => no significant HF limitation, appears to be limited mainly due to deconditioning.  - Cardiac MRI (3/20): LV EF 46%, RV EF 48%, no LGE.  - Echo (3/21): EF 50%, mild LVH, normal RV 5. Palpitations: Holter (9/19) with occasional PVCs.   SH: Married, retired from working at [employer], nonsmoker, no ETOH, no drugs.   FH: Father with CABG in 38s.  No history of nonischemic cardiomyopathy.   ROS: All systems reviewed and negative except as per HPI.   Current Outpatient Medications  Medication Sig Dispense Refill  . amLODipine (NORVASC) 5 MG tablet Take 1 tablet (5 mg total) by mouth daily. 90 tablet 3  . amoxicillin (AMOXIL) 500 MG tablet Take 4 tablets 30-60 minutes before procedure 4 tablet 0  . aspirin EC 81 MG tablet Take 1 tablet (81 mg total) by mouth 2 (two) times daily. 60 tablet 0  . CALCIUM PO Take 600 mg by mouth daily.     . carvedilol (COREG) 25 MG tablet TAKE 1 TABLET BY MOUTH TWICE A DAY 180 tablet 2  . clonazePAM (KLONOPIN) 0.5 MG tablet Take 0.5 mg by mouth at bedtime.    . diazepam (VALIUM) 5 MG tablet Take 30 to 40 minutes prior to MRI 1 tablet 0  . ipratropium (ATROVENT) 0.06 % nasal spray Place 2 sprays into both  nostrils 2 (two) times daily as needed for rhinitis.   0  . metFORMIN (GLUCOPHAGE) 500 MG tablet Take 500 mg by mouth at bedtime.     . Multiple Vitamins-Minerals (MULTIVITAMIN WITH MINERALS) tablet Take 1 tablet by mouth daily.    Marland Kitchen oxyCODONE-acetaminophen (PERCOCET/ROXICET) 5-325 MG tablet Take 1 tablet by mouth every 4 (four) hours as needed for severe pain. 30 tablet 0  . pravastatin (PRAVACHOL) 80 MG tablet Take 80 mg by mouth daily.    . sacubitril-valsartan (ENTRESTO) 97-103 MG Take 1 tablet by mouth 2 (two) times daily. Needs appointment for further refill. 60 tablet 2  . spironolactone (ALDACTONE) 25 MG tablet TAKE 1 TABLET BY MOUTH EVERY DAY 90 tablet 3  . tiZANidine (ZANAFLEX) 2 MG tablet Take 1 tablet (2 mg  total) by mouth every 6 (six) hours as needed. 60 tablet 0  . Venlafaxine HCl 225 MG TB24 Take 225 mg by mouth daily.   1  . furosemide (LASIX) 40 MG tablet Take 1 tablet (40 mg total) by mouth daily. 90 tablet 3   No current facility-administered medications for this encounter.   BP 130/70   Pulse 85   Wt 76.6 kg (168 lb 12.8 oz)   SpO2 98%   BMI 27.66 kg/m  General: NAD Neck: No JVD, no thyromegaly or thyroid nodule.  Lungs: Clear to auscultation bilaterally with normal respiratory effort. CV: Nondisplaced PMI.  Heart regular S1/S2, no S3/S4, no murmur.  No peripheral edema.  No carotid bruit.  Normal pedal pulses.  Abdomen: Soft, nontender, no hepatosplenomegaly, no distention.  Skin: Intact without lesions or rashes.  Neurologic: Alert and oriented x 3.  Psych: Normal affect. Extremities: No clubbing or cyanosis.  HEENT: Normal.   Assessment/Plan: 1. Chronic systolic CHF: Nonischemic cardiomyopathy.  Last echo in 7/19 with some improvement, EF 25-30%, up from 15% in 3/19.  Cath in 3/19 with no coronary disease.  Symptoms began in 11/19, initially seemed like URI.  Possible viral myocarditis.  No history of ETOH or drugs.  No FH of nonischemic cardiomyopathy.  CPX in 2/20 suggested minimal HF limitation, primarily limited by deconditioning.  Cardiac MRI in 3/20 showed normal RV function, LV EF 46%, no delayed enhancement.  Echo in 3/21 showed EF up to 50%.  She is out of range for ICD.  She has NYHA class II symptoms, weight is down.   - Continue Coreg 25 mg bid.   - Continue Entresto 97/103 bid.   - Continue spironolactone 25 daily, check BMET today to make sure K is ok off [place].  - Continue Lasix 40 mg daily.  - I will arrange for repeat echo.  2. HTN: BP controlled.  3. Palpitations: Occasional.  Holter in 9/19 with occasional PVCs.  4. Hyperkalemia: She has not been on Lokelma recently, will check BMET as above.    Followup in 6 months if EF is stable on echo but will need  BMET in 3 months.   Signed, Loralie Champagne 01/31/2021

## 2021-01-31 NOTE — Patient Instructions (Addendum)
Labs done today. We will contact you only if your labs are abnormal.  No medication changes were made. Please continue all current medications as prescribed.  Your physician recommends that you schedule a follow-up appointment soon for an echo, in 3 months for a lab only appointment and in 6 months for an appointment with Dr. Aundra [name].  Your physician has requested that you have an echocardiogram. Echocardiography is a painless test that uses sound waves to create images of your heart. It provides your doctor with information about the size and shape of your heart and how well your heart's chambers and valves are working. This procedure takes approximately one hour. There are no restrictions for this procedure   If you have any questions or concerns before your next appointment please send [place] a message through [place] or call our office at [phone].    TO LEAVE A MESSAGE FOR THE NURSE SELECT OPTION 2, PLEASE LEAVE A MESSAGE INCLUDING: . YOUR NAME . DATE OF BIRTH . CALL BACK NUMBER . REASON FOR CALL**this is important as we prioritize the call backs  YOU WILL RECEIVE A CALL BACK THE SAME DAY AS LONG AS YOU CALL BEFORE 4:00 PM   Do the following things EVERYDAY: 1) Weigh yourself in the morning before breakfast. Write it down and keep it in a log. 2) Take your medicines as prescribed 3) Eat low salt foods--Limit salt (sodium) to 2000 mg per day.  4) Stay as active as you can everyday 5) Limit all fluids for the day to less than 2 liters   At the [facility], you and your health needs are our priority. As part of our continuing mission to provide you with exceptional heart care, we have created designated Provider Care Teams. These Care Teams include your primary Cardiologist (physician) and Advanced Practice Providers (APPs- Physician Assistants and Nurse Practitioners) who all work together to provide you with the care you need, when you need it.   You may see any of  the following providers on your designated Care Team at your next follow up: Marland Kitchen Dr Glori Bickers . Dr Loralie Champagne . Darrick Grinder, NP . Lyda Jester, PA . Audry Riles, PharmD   Please be sure to bring in all your medications bottles to every appointment.

## 2021-02-14 ENCOUNTER — Other Ambulatory Visit (HOSPITAL_COMMUNITY): Payer: Self-pay | Admitting: Cardiology

## 2021-02-21 ENCOUNTER — Other Ambulatory Visit: Payer: Self-pay

## 2021-02-21 ENCOUNTER — Ambulatory Visit (AMBULATORY_SURGERY_CENTER): Payer: Self-pay

## 2021-02-21 VITALS — Ht 65.5 in | Wt 169.0 lb

## 2021-02-21 DIAGNOSIS — Z86010 Personal history of colonic polyps: Secondary | ICD-10-CM

## 2021-02-21 MED ORDER — NA SULFATE-K SULFATE-MG SULF 17.5-3.13-1.6 GM/177ML PO SOLN: 1.0000 | Freq: Once | ORAL | 0 refills | Status: AC

## 2021-02-21 NOTE — Progress Notes (Signed)
No allergies to soy or egg Pt is not on blood thinners or diet pills Denies issues with sedation/intubation Denies atrial flutter/fib Denies constipation   Emmi instructions given to pt  Pt is aware of Covid safety and care partner requirements.  

## 2021-02-23 ENCOUNTER — Ambulatory Visit (HOSPITAL_COMMUNITY)
Admission: RE | Admit: 2021-02-23 | Discharge: 2021-02-23 | Disposition: A | Discharge status: Home / Self Care | Payer: Medicare Other | Source: Ambulatory Visit | Attending: Family Medicine | Admitting: Family Medicine

## 2021-02-23 ENCOUNTER — Other Ambulatory Visit: Payer: Self-pay

## 2021-02-23 DIAGNOSIS — I110 Hypertensive heart disease with heart failure: Secondary | ICD-10-CM | POA: Diagnosis not present

## 2021-02-23 DIAGNOSIS — Z87891 Personal history of nicotine dependence: Secondary | ICD-10-CM | POA: Insufficient documentation

## 2021-02-23 DIAGNOSIS — I071 Rheumatic tricuspid insufficiency: Secondary | ICD-10-CM | POA: Insufficient documentation

## 2021-02-23 DIAGNOSIS — I5022 Chronic systolic (congestive) heart failure: Secondary | ICD-10-CM | POA: Diagnosis present

## 2021-02-23 LAB — ECHOCARDIOGRAM COMPLETE
Area-P 1/2: 3.5 cm2
Calc EF: 55.3 %
S' Lateral: 2.9 cm
Single Plane A2C EF: 51.8 %
Single Plane A4C EF: 58.7 %

## 2021-02-23 NOTE — Progress Notes (Signed)
  Echocardiogram 2D Echocardiogram has been performed.  Jordan [name] 02/23/2021, 10:30 AM

## 2021-03-07 ENCOUNTER — Ambulatory Visit (AMBULATORY_SURGERY_CENTER): Payer: Medicare Other | Admitting: Internal Medicine

## 2021-03-07 ENCOUNTER — Encounter: Payer: Self-pay | Admitting: Internal Medicine

## 2021-03-07 ENCOUNTER — Other Ambulatory Visit: Payer: Self-pay

## 2021-03-07 VITALS — BP 115/55 | HR 72 | Temp 97.1°F | Resp 16 | Ht 65.0 in | Wt 169.0 lb

## 2021-03-07 DIAGNOSIS — Z86010 Personal history of colonic polyps: Secondary | ICD-10-CM | POA: Diagnosis present

## 2021-03-07 DIAGNOSIS — K635 Polyp of colon: Secondary | ICD-10-CM

## 2021-03-07 DIAGNOSIS — D123 Benign neoplasm of transverse colon: Secondary | ICD-10-CM

## 2021-03-07 DIAGNOSIS — D125 Benign neoplasm of sigmoid colon: Secondary | ICD-10-CM

## 2021-03-07 DIAGNOSIS — D124 Benign neoplasm of descending colon: Secondary | ICD-10-CM

## 2021-03-07 MED ORDER — SODIUM CHLORIDE 0.9 % IV SOLN: 500.0000 mL | Freq: Once | INTRAVENOUS | Status: DC

## 2021-03-07 NOTE — Progress Notes (Signed)
Called to room to assist during endoscopic procedure.  Patient ID and intended procedure confirmed with present staff. Received instructions for my participation in the procedure from the performing physician.  

## 2021-03-07 NOTE — Patient Instructions (Signed)
Information on polyps and diverticulosis given to you today.  Await pathology results.  Resume previous diet and medications.  YOU HAD AN ENDOSCOPIC PROCEDURE TODAY AT THE [facility]:   Refer to the procedure report that was given to you for any specific questions about what was found during the examination.  If the procedure report does not answer your questions, please call your gastroenterologist to clarify.  If you requested that your care partner not be given the details of your procedure findings, then the procedure report has been included in a sealed envelope for you to review at your convenience later.  YOU SHOULD EXPECT: Some feelings of bloating in the abdomen. Passage of more gas than usual.  Walking can help get rid of the air that was put into your GI tract during the procedure and reduce the bloating. If you had a lower endoscopy (such as a colonoscopy or flexible sigmoidoscopy) you may notice spotting of blood in your stool or on the toilet paper. If you underwent a bowel prep for your procedure, you may not have a normal bowel movement for a few days.  Please Note:  You might notice some irritation and congestion in your nose or some drainage.  This is from the oxygen used during your procedure.  There is no need for concern and it should clear up in a day or so.  SYMPTOMS TO REPORT IMMEDIATELY:   Following lower endoscopy (colonoscopy or flexible sigmoidoscopy):  Excessive amounts of blood in the stool  Significant tenderness or worsening of abdominal pains  Swelling of the abdomen that is new, acute  Fever of 100F or higher   For urgent or emergent issues, a gastroenterologist can be reached at any hour by calling [phone]. Do not use MyChart messaging for urgent concerns.    DIET:  We do recommend a small meal at first, but then you may proceed to your regular diet.  Drink plenty of fluids but you should avoid alcoholic beverages for 24  hours.  ACTIVITY:  You should plan to take it easy for the rest of today and you should NOT DRIVE or use heavy machinery until tomorrow (because of the sedation medicines used during the test).    FOLLOW UP: Our staff will call the number listed on your records 48-72 hours following your procedure to check on you and address any questions or concerns that you may have regarding the information given to you following your procedure. If we do not reach you, we will leave a message.  We will attempt to reach you two times.  During this call, we will ask if you have developed any symptoms of COVID 19. If you develop any symptoms (ie: fever, flu-like symptoms, shortness of breath, cough etc.) before then, please call [phone].  If you test positive for Covid 19 in the 2 weeks post procedure, please call and report this information to us.    If any biopsies were taken you will be contacted by phone or by letter within the next 1-3 weeks.  Please call us at [phone] if you have not heard about the biopsies in 3 weeks.    SIGNATURES/CONFIDENTIALITY: You and/or your care partner have signed paperwork which will be entered into your electronic medical record.  These signatures attest to the fact that that the information above on your After Visit Summary has been reviewed and is understood.  Full responsibility of the confidentiality of this discharge information lies with you and/or   your care-partner. 

## 2021-03-07 NOTE — Progress Notes (Signed)
Report to PACU, RN, vss, BBS= Clear.  

## 2021-03-07 NOTE — Op Note (Signed)
[name] Procedure Date: 03/07/2021 7:27 AM MRN: [id number] Endoscopist: Jerene Bears , MD Age: 59 Referring MD:  Date of Birth: 1961/07/12 Gender: Female Account #: [id number] Procedure:                Colonoscopy Indications:              High risk colon cancer surveillance: Personal                            history of colonic polyps (12 mm hyperplastic                            ascending colon polyps, other subcentimeter sigmoid                            polyps), Last colonoscopy: 2013 Medicines:                Monitored Anesthesia Care Procedure:                Pre-Anesthesia Assessment:                           - Prior to the procedure, a History and Physical                            was performed, and patient medications and                            allergies were reviewed. The patient's tolerance of                            previous anesthesia was also reviewed. The risks                            and benefits of the procedure and the sedation                            options and risks were discussed with the patient.                            All questions were answered, and informed consent                            was obtained. Prior Anticoagulants: The patient has                            taken no previous anticoagulant or antiplatelet                            agents. ASA Grade Assessment: II - A patient with                            mild systemic disease. After reviewing the risks  and benefits, the patient was deemed in                            satisfactory condition to undergo the procedure.                           After obtaining informed consent, the colonoscope                            was passed under direct vision. Throughout the                            procedure, the patient's blood pressure, pulse, and                            oxygen saturations were monitored  continuously. The                            Olympus PCF-H190DL [id number]) Colonoscope was                            introduced through the anus and advanced to the                            cecum, identified by appendiceal orifice and                            ileocecal valve. The colonoscopy was performed                            without difficulty. The patient tolerated the                            procedure well. The quality of the bowel                            preparation was good (nausea and vomiting with 2nd                            half of prep). The ileocecal valve, appendiceal                            orifice, and rectum were photographed. Scope In: 8:05:36 AM Scope Out: 8:27:48 AM Scope Withdrawal Time: 0 hours 17 minutes 24 seconds  Total Procedure Duration: 0 hours 22 minutes 12 seconds  Findings:                 The digital rectal exam was normal.                           A 14 mm polyp was found in the ascending colon. The                            polyp was sessile. The polyp was removed with a  cold snare. Resection and retrieval were complete.                           A 5 mm polyp was found in the descending colon. The                            polyp was sessile. The polyp was removed with a                            cold snare. Resection and retrieval were complete.                           A 6 mm polyp was found in the sigmoid colon. The                            polyp was sessile. The polyp was removed with a                            cold snare. Resection and retrieval were complete.                           A few small-mouthed diverticula were found in the                            sigmoid colon.                           The retroflexed view of the distal rectum and anal                            verge was normal and showed no anal or rectal                            abnormalities. Complications:            No  immediate complications. Estimated Blood Loss:     Estimated blood loss was minimal. Impression:               - One 14 mm polyp in the ascending colon, removed                            with a cold snare. Resected and retrieved.                           - One 5 mm polyp in the descending colon, removed                            with a cold snare. Resected and retrieved.                           - One 6 mm polyp in the sigmoid colon, removed with  a cold snare. Resected and retrieved.                           - Diverticulosis in the sigmoid colon.                           - The distal rectum and anal verge are normal on                            retroflexion view. Recommendation:           - Patient has a contact number available for                            emergencies. The signs and symptoms of potential                            delayed complications were discussed with the                            patient. Return to normal activities tomorrow.                            Written discharge instructions were provided to the                            patient.                           - Resume previous diet.                           - Continue present medications.                           - Await pathology results.                           - Repeat colonoscopy is recommended. The                            colonoscopy date will be determined after pathology                            results from today's exam become available for                            review. Jerene Bears, MD 03/07/2021 8:37:33 AM This report has been signed electronically.

## 2021-03-07 NOTE — Progress Notes (Signed)
[employer] by [employer]

## 2021-03-08 IMAGING — MR MR CARDIA MORPHOLOGY WITHOUT AND WITH CONTRAST
14 of 16 series · 38 of 40 positions shown · IV contrast (Gadavist)
Comparison: none

CLINICAL DATA: Cardiomyopathy of uncertain etiology.

EXAM:
CARDIAC MRI
TECHNIQUE: The patient was scanned on a 1.5 Tesla GE magnet. A dedicated
cardiac coil was used. Functional imaging was done using Fiesta
sequences. [DATE], and 4 chamber views were done to assess for RWMA's.
Modified Soila rule using a short axis stack was used to
calculate an ejection fraction on a dedicated work station using
Circle software. The patient received 8 cc of Gadavist. After 10
minutes inversion recovery sequences were used to assess for
infiltration and scar tissue.

[Series 8: bSSFP · sagittal · 8.0mm · 1.61mm/px · 25 of 375 slices shown (1 of 4)]
[im 1/375]
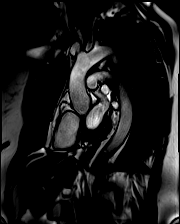
[im 16/375]
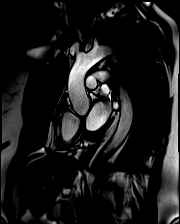
[im 32/375]
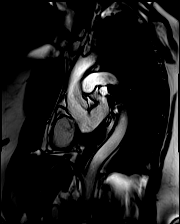
[im 47/375]
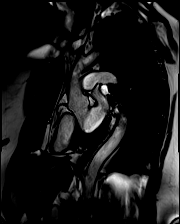
[im 63/375]
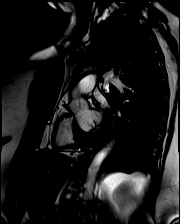
[im 78/375]
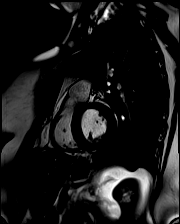
[im 94/375]
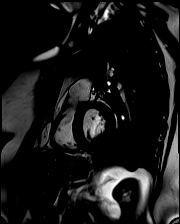
[im 110/375]
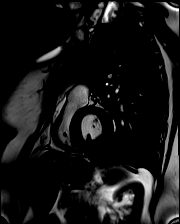
[im 125/375]
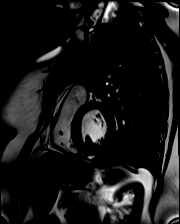
[im 141/375]
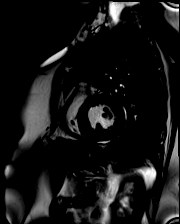
[im 156/375]
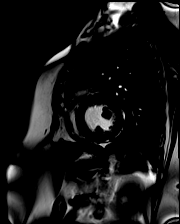
[im 172/375]
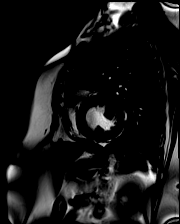
[im 188/375]
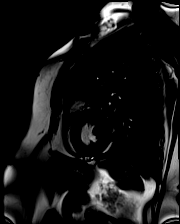
[im 203/375]
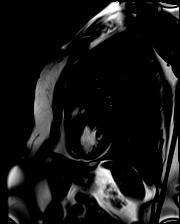
[im 219/375]
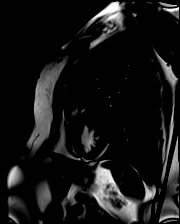
[im 234/375]
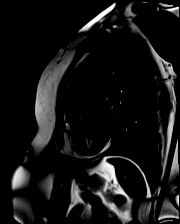
[im 250/375]
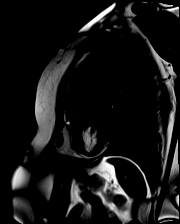
[im 265/375]
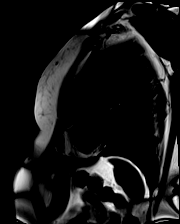
[im 281/375]
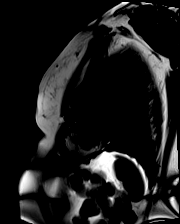
[im 297/375]
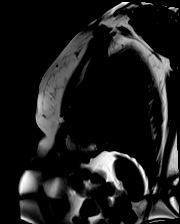
[im 312/375]
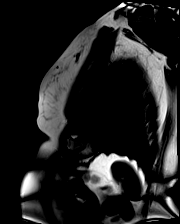
[im 328/375]
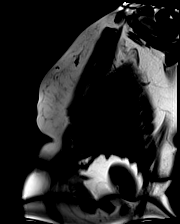
[im 343/375]
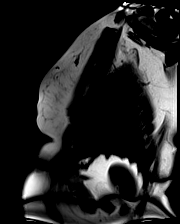
[im 359/375]
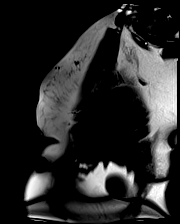
[im 375/375]
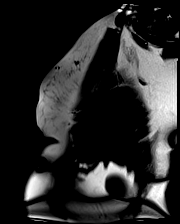

[Series 9: t2_stir_db_sax · oblique · 8.0mm · 1.73mm/px · 1 of 12 slices shown]
[im 1/12]
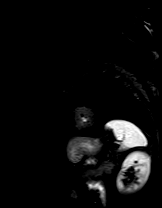

[Series 10: t1_tse_db short axis · oblique · 8.0mm · 1.32mm/px · 1 of 12 slices shown]
[im 1/12]
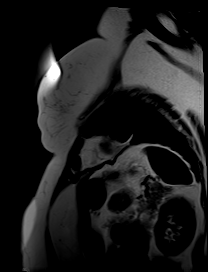

[Series 11: bSSFP · axial · 7.0mm · 1.48mm/px · 1 of 25 slices shown (2 of 4)]
[im 1/25]
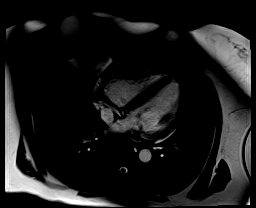

[Series 12: bSSFP · oblique · 7.0mm · 1.48mm/px · 1 of 25 slices shown (3 of 4)]
[im 1/25]
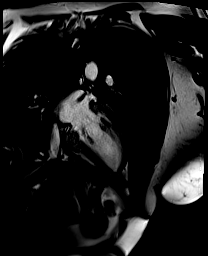

[Series 13: bSSFP · coronal · 7.0mm · 1.48mm/px · 1 of 25 slices shown (4 of 4)]
[im 1/25]
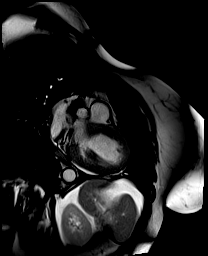

[Series 20: lge short axis_mag · oblique · 8.0mm · 1.50mm/px · 1 of 15 slices shown]
[im 1/15]
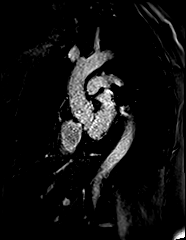

[Series 21: lge short axis_psir · oblique · 8.0mm · 1.50mm/px · 1 of 15 slices shown]
[im 1/15]
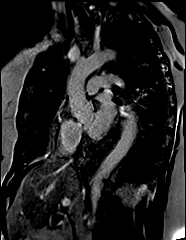

[Series 23: lge_single shot sa · oblique · 8.0mm · 1.98mm/px · 1 of 14 slices shown (1 of 2)]
[im 1/14]
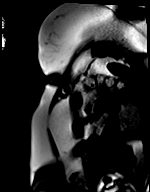

[Series 24: lge_single shot sa · oblique · 8.0mm · 1.98mm/px · 1 of 14 slices shown (2 of 2)]
[im 1/14]
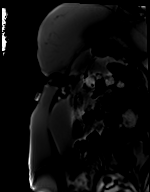

[Series 25: lge radial ((date)ch)_mag · axial · 7.0mm · 1.32mm/px · 1 of 3 slices shown (1 of 2)]
[im 1/3]
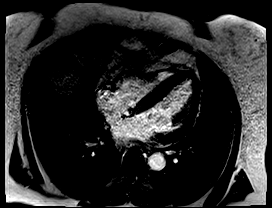

[Series 26: lge radial ((date)ch)_psir · axial · 7.0mm · 1.32mm/px · 1 of 3 slices shown (1 of 2)]
[im 1/3]
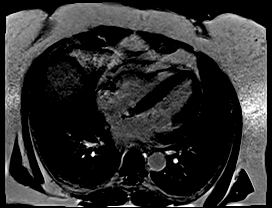

[Series 27: lge radial ((date)ch)_mag · axial · 7.0mm · 1.32mm/px · 1 of 3 slices shown (2 of 2)]
[im 1/3]
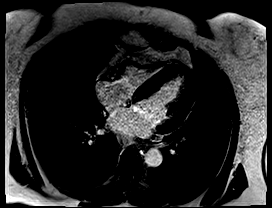

[Series 28: lge radial ((date)ch)_psir · axial · 7.0mm · 1.32mm/px · 1 of 3 slices shown (2 of 2)]
[im 1/3]
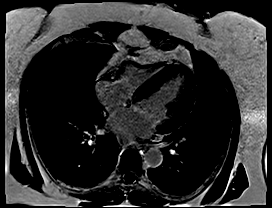

[38 of 40 positions shown; findings below may reference images not displayed]

FINDINGS: Limited images of the lung fields showed no gross abnormalities.

Trivial pericardial effusion.

Normal left ventricular size with mild LV hypertrophy. EF 46% with
diffuse hypokinesis. Normal right ventricular size and systolic
function, EF 48%. Normal left and right atrial sizes. Trileaflet
aortic valve with no significant stenosis or regurgitation. No
significant mitral regurgitation noted.

On delayed enhancement imaging, no myocardial late gadolinium
enhancement (LGE) noted.

Measurements:

LVEDV 123 mL

LVSV 57 mL

LVEF 46%

RVEDV 75 mL

RVSV 36 mL

RVEF 48%
IMPRESSION: 1. Normal left ventricular size with mild LV hypertrophy. EF 46%,
diffuse hypokinesis.

2.  Normal right ventricular size and systolic function, EF 48%.

3. No myocardial LGE so no evidence for prior MI, infiltrative
disease, or myocarditis.

Aurelio De Rosa

## 2021-03-09 ENCOUNTER — Telehealth: Payer: Self-pay

## 2021-03-09 NOTE — Telephone Encounter (Signed)
  Follow up Call-  Call back number 03/07/2021  Post procedure Call Back phone  # [phone]  Permission to leave phone message Yes  Some recent data might be hidden     Patient questions:  Do you have a fever, pain , or abdominal swelling? No. Pain Score  0 *  Have you tolerated food without any problems? Yes.    Have you been able to return to your normal activities? Yes.    Do you have any questions about your discharge instructions: Diet   No. Medications  No. Follow up visit  No.  Do you have questions or concerns about your Care? No.  Actions: * If pain score is 4 or above: No action needed, pain <4.  1. Have you developed a fever since your procedure? no  2.   Have you had an respiratory symptoms (SOB or cough) since your procedure? no  3.   Have you tested positive for COVID 19 since your procedure no  4.   Have you had any family members/close contacts diagnosed with the COVID 19 since your procedure?  no   If yes to any of these questions please route to Joylene John, RN and Joella Prince, RN

## 2021-03-15 ENCOUNTER — Encounter: Payer: Self-pay | Admitting: Internal Medicine

## 2021-03-30 ENCOUNTER — Other Ambulatory Visit (HOSPITAL_COMMUNITY): Payer: Self-pay | Admitting: Cardiology

## 2021-04-23 ENCOUNTER — Other Ambulatory Visit (HOSPITAL_COMMUNITY): Payer: Self-pay | Admitting: Cardiology

## 2021-04-30 ENCOUNTER — Other Ambulatory Visit: Payer: Self-pay

## 2021-04-30 ENCOUNTER — Ambulatory Visit (HOSPITAL_COMMUNITY)
Admission: RE | Admit: 2021-04-30 | Discharge: 2021-04-30 | Disposition: A | Discharge status: Home / Self Care | Payer: Medicare Other | Source: Ambulatory Visit | Attending: Cardiology | Admitting: Cardiology

## 2021-04-30 DIAGNOSIS — I5022 Chronic systolic (congestive) heart failure: Secondary | ICD-10-CM | POA: Diagnosis present

## 2021-04-30 LAB — BASIC METABOLIC PANEL
Anion gap: 8 (ref 5–15)
BUN: 12 mg/dL (ref 6–20)
CO2: 27 mmol/L (ref 22–32)
Calcium: 9.9 mg/dL (ref 8.9–10.3)
Chloride: 105 mmol/L (ref 98–111)
Creatinine, Ser: 1.13 mg/dL — ABNORMAL HIGH (ref 0.44–1.00)
GFR, Estimated: 56 mL/min — ABNORMAL LOW (ref >60)
Glucose, Bld: 109 mg/dL — ABNORMAL HIGH (ref 70–99)
Potassium: 4.7 mmol/L (ref 3.5–5.1)
Sodium: 140 mmol/L (ref 135–145)

## 2021-07-16 ENCOUNTER — Other Ambulatory Visit (HOSPITAL_COMMUNITY): Payer: Self-pay | Admitting: Cardiology

## 2021-10-13 ENCOUNTER — Other Ambulatory Visit (HOSPITAL_COMMUNITY): Payer: Self-pay | Admitting: Cardiology

## 2021-12-31 ENCOUNTER — Other Ambulatory Visit (HOSPITAL_COMMUNITY): Payer: Self-pay | Admitting: Cardiology

## 2022-01-05 ENCOUNTER — Other Ambulatory Visit (HOSPITAL_COMMUNITY): Payer: Self-pay | Admitting: Cardiology

## 2022-01-12 ENCOUNTER — Other Ambulatory Visit (HOSPITAL_COMMUNITY): Payer: Self-pay | Admitting: Cardiology

## 2022-01-30 ENCOUNTER — Other Ambulatory Visit (HOSPITAL_COMMUNITY): Payer: Self-pay | Admitting: Cardiology

## 2022-01-30 DIAGNOSIS — I5022 Chronic systolic (congestive) heart failure: Secondary | ICD-10-CM

## 2022-02-02 IMAGING — CR DG CHEST 2V
2 series · 2 of 2 positions shown · non-contrast
Comparison: 07/31/2018

CLINICAL DATA: Preoperative assessment for knee surgery, history of
CHF, mitral valve prolapse

EXAM:
CHEST - 2 VIEW

[w chest pa]
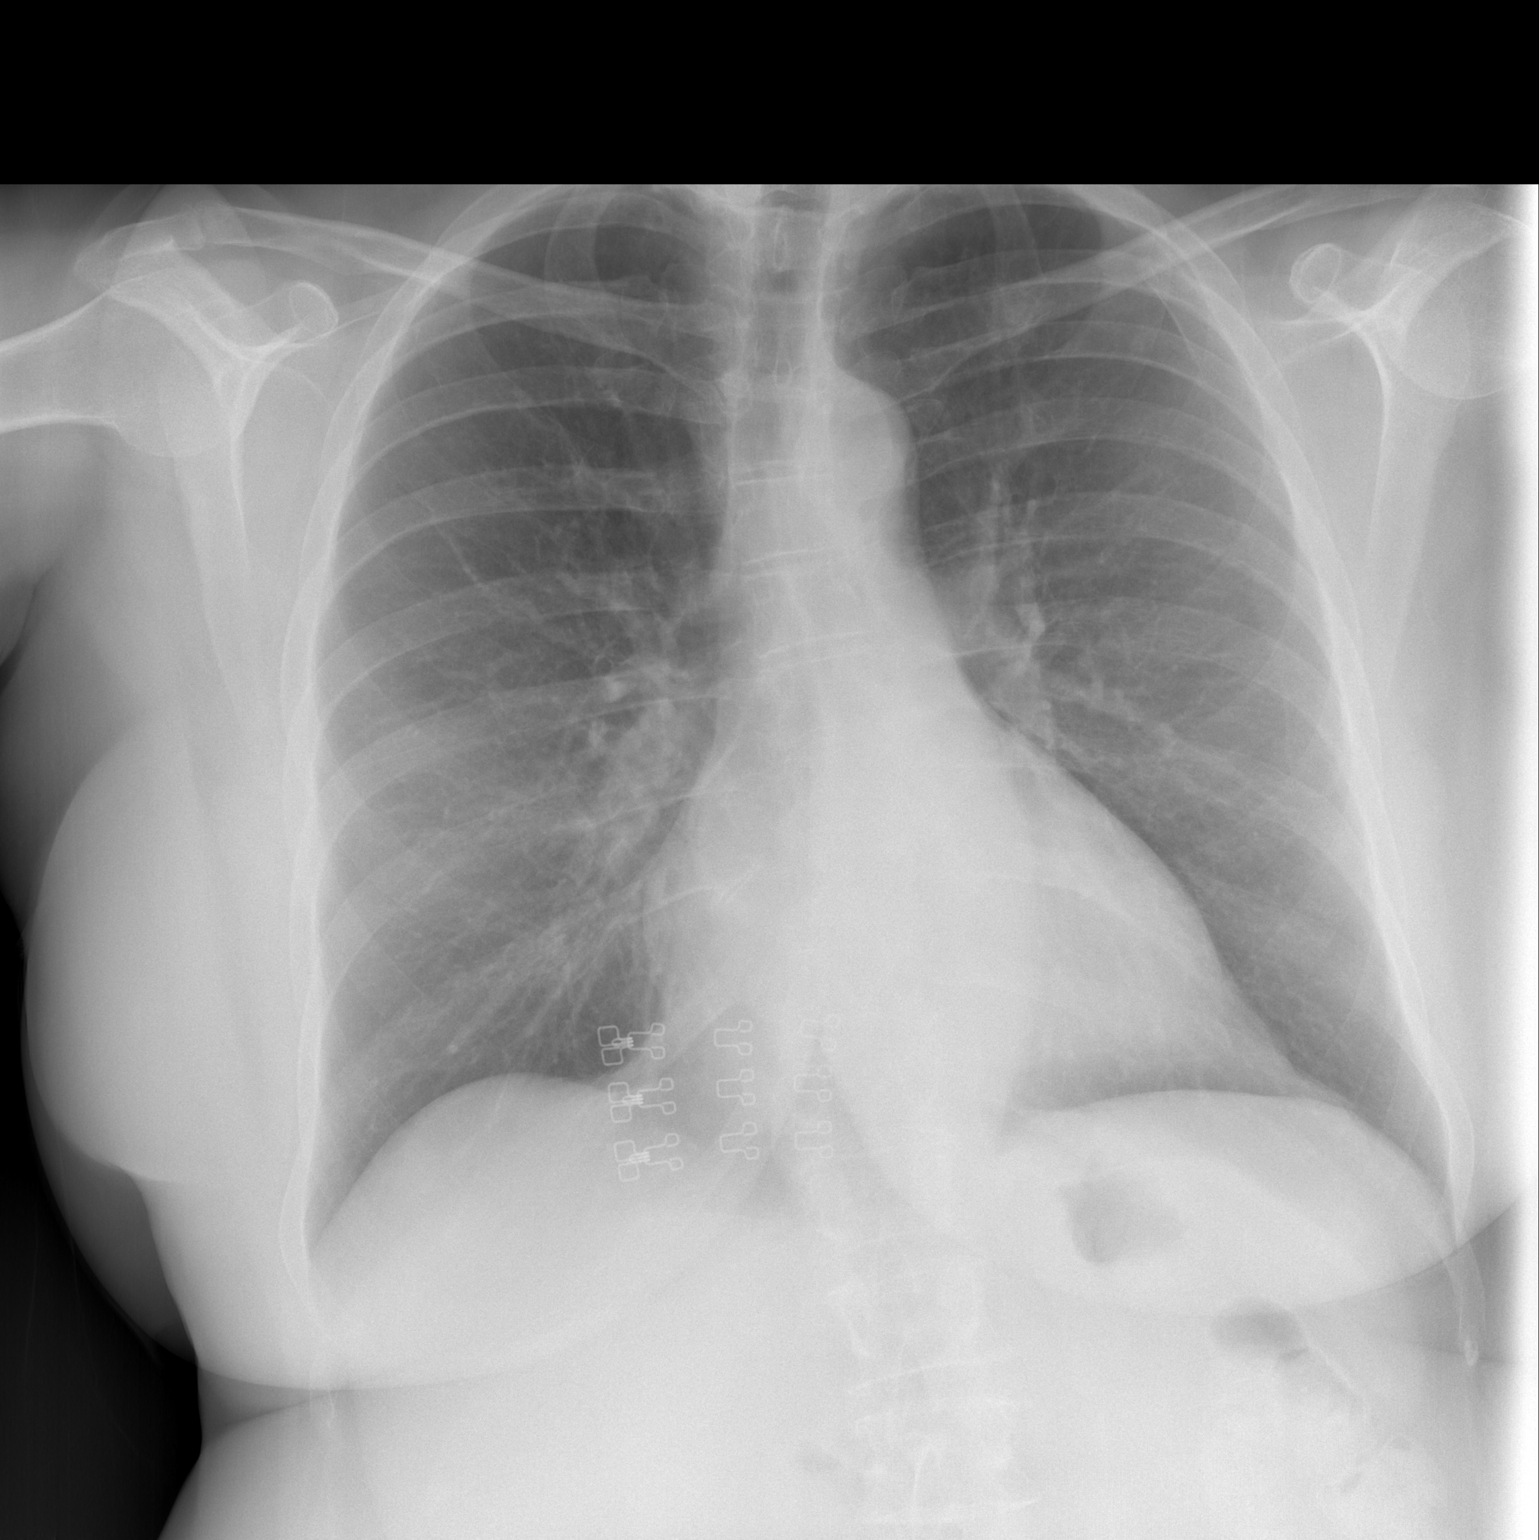

[w chest lat]
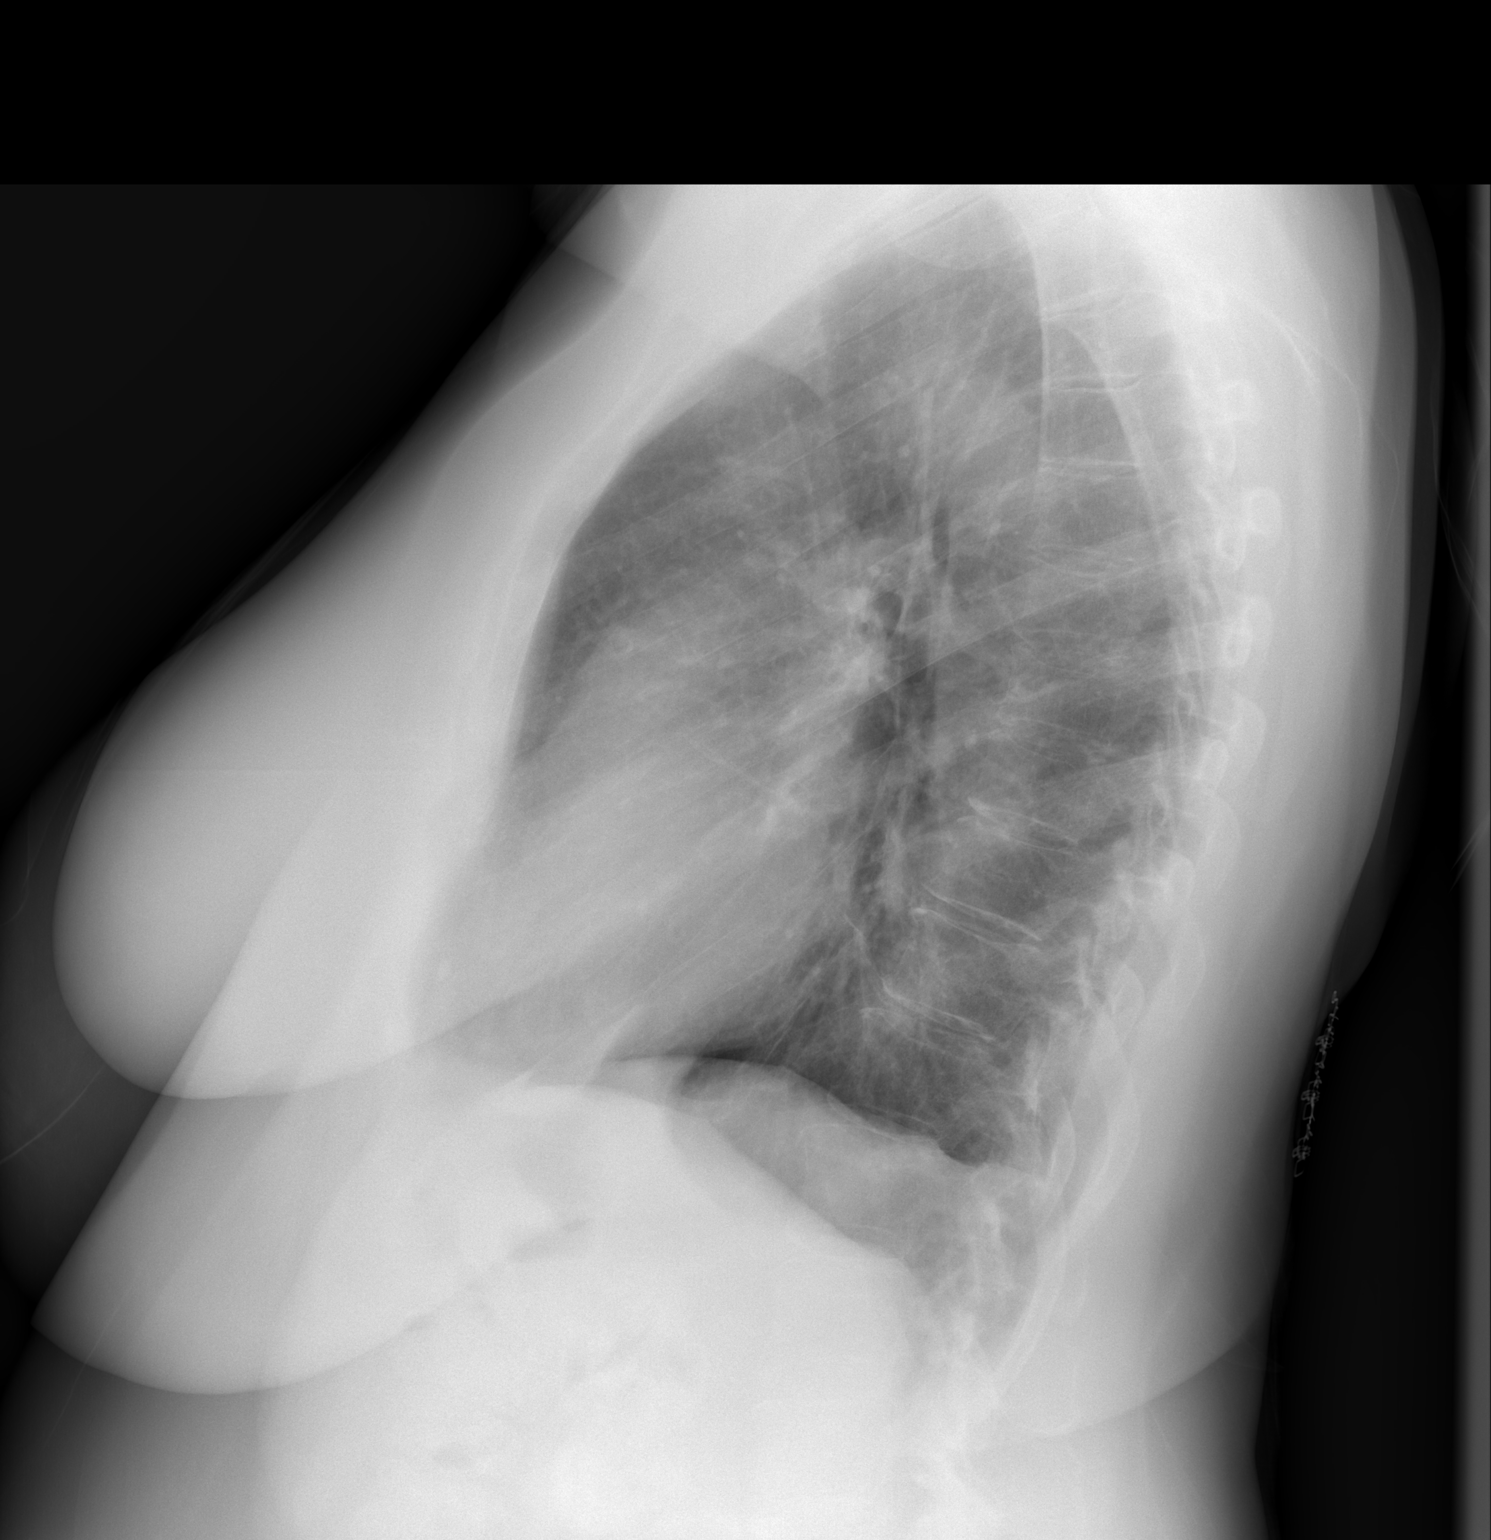

[2 of 2 positions shown; findings below may reference images not displayed]

FINDINGS: The heart size and mediastinal contours are within normal limits.
Both lungs are clear. The visualized skeletal structures are
unremarkable.
IMPRESSION: No active cardiopulmonary disease.

## 2022-02-10 ENCOUNTER — Other Ambulatory Visit (HOSPITAL_COMMUNITY): Payer: Self-pay | Admitting: Cardiology

## 2022-02-17 ENCOUNTER — Other Ambulatory Visit (HOSPITAL_COMMUNITY): Payer: Self-pay | Admitting: Cardiology

## 2022-03-14 ENCOUNTER — Other Ambulatory Visit: Payer: Self-pay | Admitting: Family Medicine

## 2022-03-14 DIAGNOSIS — M8580 Other specified disorders of bone density and structure, unspecified site: Secondary | ICD-10-CM

## 2022-03-29 ENCOUNTER — Other Ambulatory Visit: Payer: Self-pay | Admitting: Family Medicine

## 2022-03-29 DIAGNOSIS — Z1231 Encounter for screening mammogram for malignant neoplasm of breast: Secondary | ICD-10-CM

## 2022-05-14 ENCOUNTER — Other Ambulatory Visit (HOSPITAL_COMMUNITY): Payer: Self-pay | Admitting: Cardiology

## 2022-05-15 ENCOUNTER — Other Ambulatory Visit (HOSPITAL_COMMUNITY): Payer: Self-pay | Admitting: Cardiology

## 2022-05-15 MED ORDER — ENTRESTO 97-103 MG PO TABS: 1.0000 | ORAL_TABLET | Freq: Two times a day (BID) | ORAL | 0 refills | Status: DC

## 2022-05-15 MED ORDER — SPIRONOLACTONE 25 MG PO TABS: 25.0000 mg | ORAL_TABLET | Freq: Every day | ORAL | 0 refills | Status: DC

## 2022-05-15 NOTE — Addendum Note (Signed)
Addended by: Shela Nevin R on: 0/01/7740 07:14 AM   Modules accepted: Orders

## 2022-06-10 ENCOUNTER — Other Ambulatory Visit (HOSPITAL_COMMUNITY): Payer: Self-pay | Admitting: Cardiology

## 2022-06-15 ENCOUNTER — Other Ambulatory Visit (HOSPITAL_COMMUNITY): Payer: Self-pay | Admitting: Cardiology

## 2022-07-07 ENCOUNTER — Other Ambulatory Visit (HOSPITAL_COMMUNITY): Payer: Self-pay | Admitting: Cardiology

## 2022-07-10 ENCOUNTER — Other Ambulatory Visit (HOSPITAL_COMMUNITY): Payer: Self-pay | Admitting: Cardiology

## 2022-07-13 ENCOUNTER — Other Ambulatory Visit (HOSPITAL_COMMUNITY): Payer: Self-pay | Admitting: Cardiology

## 2022-07-29 ENCOUNTER — Other Ambulatory Visit (HOSPITAL_COMMUNITY): Payer: Self-pay | Admitting: Cardiology

## 2022-09-03 ENCOUNTER — Other Ambulatory Visit (HOSPITAL_COMMUNITY): Payer: Self-pay | Admitting: Cardiology

## 2022-09-14 ENCOUNTER — Other Ambulatory Visit (HOSPITAL_COMMUNITY): Payer: Self-pay | Admitting: Cardiology

## 2022-09-18 ENCOUNTER — Ambulatory Visit
Admission: RE | Admit: 2022-09-18 | Discharge: 2022-09-18 | Disposition: A | Discharge status: Home / Self Care | Payer: Medicare Other | Source: Ambulatory Visit | Attending: Family Medicine | Admitting: Family Medicine

## 2022-09-18 DIAGNOSIS — M8580 Other specified disorders of bone density and structure, unspecified site: Secondary | ICD-10-CM

## 2022-09-18 DIAGNOSIS — Z1231 Encounter for screening mammogram for malignant neoplasm of breast: Secondary | ICD-10-CM

## 2022-09-28 ENCOUNTER — Other Ambulatory Visit (HOSPITAL_COMMUNITY): Payer: Self-pay | Admitting: Cardiology

## 2022-10-02 ENCOUNTER — Other Ambulatory Visit (HOSPITAL_COMMUNITY): Payer: Self-pay | Admitting: Cardiology

## 2022-10-07 ENCOUNTER — Other Ambulatory Visit (HOSPITAL_COMMUNITY): Payer: Self-pay | Admitting: Cardiology

## 2022-11-03 ENCOUNTER — Other Ambulatory Visit (HOSPITAL_COMMUNITY): Payer: Self-pay | Admitting: Cardiology

## 2022-11-06 ENCOUNTER — Other Ambulatory Visit (HOSPITAL_COMMUNITY): Payer: Self-pay | Admitting: Cardiology

## 2022-11-16 ENCOUNTER — Other Ambulatory Visit (HOSPITAL_COMMUNITY): Payer: Self-pay | Admitting: Cardiology

## 2022-11-20 ENCOUNTER — Other Ambulatory Visit (HOSPITAL_COMMUNITY): Payer: Self-pay | Admitting: Cardiology

## 2022-12-25 ENCOUNTER — Other Ambulatory Visit (HOSPITAL_COMMUNITY): Payer: Self-pay | Admitting: Cardiology

## 2023-04-21 ENCOUNTER — Other Ambulatory Visit (HOSPITAL_COMMUNITY): Payer: Self-pay | Admitting: Cardiology

## 2023-04-21 MED ORDER — ENTRESTO 97-103 MG PO TABS: 1.0000 | ORAL_TABLET | Freq: Two times a day (BID) | ORAL | 0 refills | Status: DC

## 2023-04-23 ENCOUNTER — Encounter (HOSPITAL_COMMUNITY): Payer: Medicare Other

## 2023-05-01 NOTE — Progress Notes (Signed)
ADVANCED HF CLINIC NOTE   ID:  Jordan [name], DOB 11/24/1961, MRN [id number]   Provider location: [facility] Type of Visit: Established patient   PCP:  Jarrett Soho, PA-C  Cardiologist:  Lesleigh Noe, MD HF Cardiology: Dr. Shirlee Latch   History of Present Illness: Jordan [name] is a 62 y.o.female who initially developed dyspnea in 11/18. She had several rounds of antibiotics for presumed bronchitis.  She did not get better, and ended up hospitalized in 3/19 where she was found to have acute systolic CHF.  Echo in 3/19 showed EF 15% with moderate-severe MR.  She was diuresed, and RHC/LHC was done => no coronary disease, low but not markedly low cardiac output.  Since then, she has had another echo in 7/19 that showed EF 25-30% and mild MR.   - Cardiac MRI 3/20 showed LV EF 46%, RV EF 48%, no LGE.   - Echo in 3/21 showed EF up to 50% with mild LVH.  - Echo 3/22: EF 55-60%, GIDD, RV normal.  Stable at AHF f/u 2/22.   Today she returns for AHF follow up. Has not been seen in 2 years. Overall feeling good. Denies palpitations, CP, edema, or PND/Orthopnea. Dizziness with quick movements, resolves with rest and slowing down. SOB sometimes when she's outside d/t allergies and sometimes with chores at home. Appetite good. No fever or chills. Weight at home 176 pounds. Taking all medications, has been out of lasix for a couple of months. Denies ETOH, smoking.  Walks dogs and plays outside with her 2 grand babies.   EKG NSR 83 bpm (Personally reviewed)    Labs (8/19): pro-BNP 746, hgb 13.7, K 4.9, creatinine 1.01 Labs (9/19): ANA negative, myeloma panel negative, TSH normal, Fe studies normal.  Labs (10/19): K 4.3, creatinine 0.87 Labs (1/20): K 4.3, creatinine 0.88 Labs (3/20): K 4.2, creatinine 0.97 Labs (8/20): K 4.6, creatinine 1.06, TSH normal, hgb 14.1 Labs (2/21): K 4.2, creatinine 0.96 Labs (4/21): LDL 80, TGs 326 Labs (6/21): K 4.7, creatinine  1.28 Labs (5/22): K 4.7, SCr 1.13  PMH:  1. Hyperlipidemia 2. Left TKR 3. HTN 4. Chronic systolic CHF: Nonischemic cardiomyopathy.  - Echo (3/19): EF 15%, mild LV dilation, mild LVH, moderate-severe mitral regurgitation, PASP 34 mmHg.  - LHC/RHC (4/19): No CAD.  Mean RA 4, PA 31/14, mean PCWP 12, CI 2.1 Fick/2.1 Thermo.   - Echo (7/19): EF 25-30%, severe LV dilation, mild MR.  - CPX (2/20): RER 1.3, VE/VCO2 27, VO2 15.8 => no significant HF limitation, appears to be limited mainly due to deconditioning.  - Cardiac MRI (3/20): LV EF 46%, RV EF 48%, no LGE.  - Echo (3/21): EF 50%, mild LVH, normal RV 5. Palpitations: Holter (9/19) with occasional PVCs.   SH: Married, retired from working at [employer], nonsmoker, no ETOH, no drugs.   FH: Father with CABG in 58s.  No history of nonischemic cardiomyopathy.   ROS: All systems reviewed and negative except as per HPI.   Current Outpatient Medications  Medication Sig Dispense Refill   amLODipine (NORVASC) 5 MG tablet TAKE 1 TABLET (5 MG TOTAL) BY MOUTH DAILY. PLEASE SCHEDULE AN APPOINTMENT FOR FURTHER REFILLS 90 tablet 1   amoxicillin (AMOXIL) 500 MG tablet Take 4 tablets 30-60 minutes before procedure 4 tablet 0   aspirin EC 81 MG tablet Take 1 tablet (81 mg total) by mouth 2 (two) times daily. (Patient taking differently: Take 81 mg by mouth daily.)  60 tablet 0   CALCIUM PO Take 600 mg by mouth daily.      carvedilol (COREG) 25 MG tablet Take 1 tablet (25 mg total) by mouth 2 (two) times daily. NEEDS APPOINTMENT FOR MORE REFILLS 30 tablet 0   clonazePAM (KLONOPIN) 0.5 MG tablet Take 0.5 mg by mouth at bedtime.     diazepam (VALIUM) 5 MG tablet Take 30 to 40 minutes prior to MRI 1 tablet 0   ipratropium (ATROVENT) 0.06 % nasal spray Place 2 sprays into both nostrils 2 (two) times daily as needed for rhinitis.   0   metFORMIN (GLUCOPHAGE) 500 MG tablet Take 500 mg by mouth at bedtime.      Multiple Vitamins-Minerals (MULTIVITAMIN WITH  MINERALS) tablet Take 1 tablet by mouth daily.     oxyCODONE-acetaminophen (PERCOCET/ROXICET) 5-325 MG tablet Take 1 tablet by mouth every 4 (four) hours as needed for severe pain. 30 tablet 0   pravastatin (PRAVACHOL) 80 MG tablet Take 80 mg by mouth daily.     sacubitril-valsartan (ENTRESTO) 97-103 MG Take 1 tablet by mouth 2 (two) times daily. 60 tablet 0   spironolactone (ALDACTONE) 25 MG tablet TAKE 1 TABLET BY MOUTH DAILY. MUST HAVE OFFICE VISIT FOR ADDITIONAL REFILLS CALL [phone] 15 tablet 0   tiZANidine (ZANAFLEX) 2 MG tablet Take 1 tablet (2 mg total) by mouth every 6 (six) hours as needed. 60 tablet 0   Venlafaxine HCl 225 MG TB24 Take 225 mg by mouth daily.   1   No current facility-administered medications for this encounter.   BP 110/66   Pulse 91   Wt 80.3 kg (177 lb)   SpO2 93%   BMI 29.45 kg/m   General:  well appearing.  No respiratory difficulty. Walked into clinic HEENT: normal Neck: supple. JVD flat. Carotids 2+ bilat; no bruits. No lymphadenopathy or thyromegaly appreciated. Cor: PMI nondisplaced. Regular rate & rhythm. No rubs, gallops or murmurs. Lungs: clear Abdomen: soft, nontender, nondistended. No hepatosplenomegaly. No bruits or masses. Good bowel sounds. Extremities: no cyanosis, clubbing, rash, edema  Neuro: alert & oriented x 3, cranial nerves grossly intact. moves all 4 extremities w/o difficulty. Affect pleasant.   ReDS 24%  Wt Readings from Last 3 Encounters:  05/02/23 80.3 kg (177 lb)  03/07/21 76.7 kg (169 lb)  02/21/21 76.7 kg (169 lb)    Assessment/Plan: 1. Chronic systolic CHF: Nonischemic cardiomyopathy.  Last echo in 7/19 with some improvement, EF 25-30%, up from 15% in 3/19.  Cath in 3/19 with no coronary disease.  Symptoms began in 11/19, initially seemed like URI.  Possible viral myocarditis.  No history of ETOH or drugs.  No FH of nonischemic cardiomyopathy.  CPX in 2/20 suggested minimal HF limitation, primarily limited by  deconditioning.  Cardiac MRI in 3/20 showed normal RV function, LV EF 46%, no delayed enhancement.  Echo 3/21 showed EF up to 50%. Echo 3/22: EF 55-60%, GIDD, RV normal. She is out of range for ICD.  She has NYHA class II- early III symptoms. - Continue Coreg 25 mg bid.   - Continue Entresto 97/103 bid.   - Continue spironolactone 25 daily - Start Jardiance 10 mg daily, encouraged to increase fluid intake if she felt dry or dizzy.  - has been out of lasix, would not restart. Volume stable and starting SGLT2i - CMET, CBC, Hgb A1c, lipid panel today  - repeat BMET 1 week 2. HTN: BP controlled.  3. Palpitations. Holter 9/19 with occasional PVCs. None felt recently  4. Hx of Hyperkalemia: Previously been on Lokelma, has not been on it for a while now. BMET today  Follow up 3 months with Dr. Shirlee Latch + echo  Signed, Alen Bleacher AGACNP-BC  05/02/2023

## 2023-05-02 ENCOUNTER — Encounter (HOSPITAL_COMMUNITY): Payer: Self-pay

## 2023-05-02 ENCOUNTER — Other Ambulatory Visit (HOSPITAL_COMMUNITY): Payer: Self-pay

## 2023-05-02 ENCOUNTER — Ambulatory Visit (HOSPITAL_COMMUNITY)
Admission: RE | Admit: 2023-05-02 | Discharge: 2023-05-02 | Disposition: A | Discharge status: Home / Self Care | Payer: Medicare Other | Source: Ambulatory Visit | Attending: Family Medicine | Admitting: Family Medicine

## 2023-05-02 VITALS — BP 110/66 | HR 91 | Wt 177.0 lb

## 2023-05-02 DIAGNOSIS — I10 Essential (primary) hypertension: Secondary | ICD-10-CM

## 2023-05-02 DIAGNOSIS — R002 Palpitations: Secondary | ICD-10-CM | POA: Diagnosis not present

## 2023-05-02 DIAGNOSIS — Z8249 Family history of ischemic heart disease and other diseases of the circulatory system: Secondary | ICD-10-CM | POA: Insufficient documentation

## 2023-05-02 DIAGNOSIS — Z79899 Other long term (current) drug therapy: Secondary | ICD-10-CM | POA: Diagnosis not present

## 2023-05-02 DIAGNOSIS — I110 Hypertensive heart disease with heart failure: Secondary | ICD-10-CM | POA: Insufficient documentation

## 2023-05-02 DIAGNOSIS — R7989 Other specified abnormal findings of blood chemistry: Secondary | ICD-10-CM | POA: Diagnosis not present

## 2023-05-02 DIAGNOSIS — I5022 Chronic systolic (congestive) heart failure: Secondary | ICD-10-CM | POA: Diagnosis present

## 2023-05-02 DIAGNOSIS — I428 Other cardiomyopathies: Secondary | ICD-10-CM | POA: Diagnosis not present

## 2023-05-02 DIAGNOSIS — Z8639 Personal history of other endocrine, nutritional and metabolic disease: Secondary | ICD-10-CM

## 2023-05-02 LAB — COMPREHENSIVE METABOLIC PANEL
ALT: 20 U/L (ref 0–44)
AST: 20 U/L (ref 15–41)
Albumin: 4.2 g/dL (ref 3.5–5.0)
Alkaline Phosphatase: 75 U/L (ref 38–126)
Anion gap: 6 (ref 5–15)
BUN: 12 mg/dL (ref 8–23)
CO2: 25 mmol/L (ref 22–32)
Calcium: 9.7 mg/dL (ref 8.9–10.3)
Chloride: 107 mmol/L (ref 98–111)
Creatinine, Ser: 0.94 mg/dL (ref 0.44–1.00)
GFR, Estimated: >60 mL/min (ref >60)
Glucose, Bld: 93 mg/dL (ref 70–99)
Potassium: 4.9 mmol/L (ref 3.5–5.1)
Sodium: 138 mmol/L (ref 135–145)
Total Bilirubin: 0.5 mg/dL (ref 0.3–1.2)
Total Protein: 7.2 g/dL (ref 6.5–8.1)

## 2023-05-02 LAB — LIPID PANEL
Cholesterol: 174 mg/dL (ref 0–200)
HDL: 50 mg/dL (ref >40)
LDL Cholesterol: 84 mg/dL (ref 0–99)
Total CHOL/HDL Ratio: 3.5 RATIO
Triglycerides: 198 mg/dL — ABNORMAL HIGH (ref <150)
VLDL: 40 mg/dL (ref 0–40)

## 2023-05-02 LAB — CBC
HCT: 40.8 % (ref 36.0–46.0)
Hemoglobin: 13 g/dL (ref 12.0–15.0)
MCH: 30.3 pg (ref 26.0–34.0)
MCHC: 31.9 g/dL (ref 30.0–36.0)
MCV: 95.1 fL (ref 80.0–100.0)
Platelets: 252 10*3/uL (ref 150–400)
RBC: 4.29 MIL/uL (ref 3.87–5.11)
RDW: 11.8 % (ref 11.5–15.5)
WBC: 5.9 10*3/uL (ref 4.0–10.5)
nRBC: 0 % (ref 0.0–0.2)

## 2023-05-02 MED ORDER — ENTRESTO 97-103 MG PO TABS: 1.0000 | ORAL_TABLET | Freq: Two times a day (BID) | ORAL | 5 refills | Status: DC

## 2023-05-02 MED ORDER — EMPAGLIFLOZIN 10 MG PO TABS: 10.0000 mg | ORAL_TABLET | Freq: Every day | ORAL | 1 refills | Status: DC

## 2023-05-02 NOTE — Patient Instructions (Signed)
EKG done today.  Labs done today. We will contact you only if your labs are abnormal.  START Jardiance 10mg  (1 tablet) by mouth daily.   No other medication changes were made. Please continue all current medications as prescribed.  Your physician recommends that you schedule a follow-up appointment in: 1 week for a lab only appointment and in 3 months with an echo prior to your appointment.   Your physician has requested that you have an echocardiogram. Echocardiography is a painless test that uses sound waves to create images of your heart. It provides your doctor with information about the size and shape of your heart and how well your heart's chambers and valves are working. This procedure takes approximately one hour. There are no restrictions for this procedure. Please do NOT wear cologne, perfume, aftershave, or lotions (deodorant is allowed). Please arrive 15 minutes prior to your appointment time.  If you have any questions or concerns before your next appointment please send [place] a message through Story or call our office at [phone].    TO LEAVE A MESSAGE FOR THE NURSE SELECT OPTION 2, PLEASE LEAVE A MESSAGE INCLUDING: YOUR NAME DATE OF BIRTH CALL BACK NUMBER REASON FOR CALL**this is important as we prioritize the call backs  YOU WILL RECEIVE A CALL BACK THE SAME DAY AS LONG AS YOU CALL BEFORE 4:00 PM   Do the following things EVERYDAY: Weigh yourself in the morning before breakfast. Write it down and keep it in a log. Take your medicines as prescribed Eat low salt foods--Limit salt (sodium) to 2000 mg per day.  Stay as active as you can everyday Limit all fluids for the day to less than 2 liters   At the [facility], you and your health needs are our priority. As part of our continuing mission to provide you with exceptional heart care, we have created designated Provider Care Teams. These Care Teams include your primary Cardiologist (physician) and  Advanced Practice Providers (APPs- Physician Assistants and Nurse Practitioners) who all work together to provide you with the care you need, when you need it.   You may see any of the following providers on your designated Care Team at your next follow up: Dr Arvilla Meres Dr Marca Ancona Dr. Marcos Eke, NP Robbie Lis, [place] [facility] [place], [place] Brynda Peon, NP Karle Plumber, PharmD   Please be sure to bring in all your medications bottles to every appointment.    Thank you for choosing [facility]

## 2023-05-03 LAB — HEMOGLOBIN A1C
Hgb A1c MFr Bld: 6.7 % — ABNORMAL HIGH (ref 4.8–5.6)
Mean Plasma Glucose: 146 mg/dL

## 2023-05-07 ENCOUNTER — Ambulatory Visit (HOSPITAL_COMMUNITY)
Admission: RE | Admit: 2023-05-07 | Discharge: 2023-05-07 | Disposition: A | Discharge status: Home / Self Care | Payer: Medicare Other | Source: Ambulatory Visit | Attending: Internal Medicine | Admitting: Internal Medicine

## 2023-05-07 DIAGNOSIS — I5022 Chronic systolic (congestive) heart failure: Secondary | ICD-10-CM | POA: Insufficient documentation

## 2023-05-07 LAB — BASIC METABOLIC PANEL
Anion gap: 12 (ref 5–15)
BUN: 14 mg/dL (ref 8–23)
CO2: 25 mmol/L (ref 22–32)
Calcium: 9.7 mg/dL (ref 8.9–10.3)
Chloride: 101 mmol/L (ref 98–111)
Creatinine, Ser: 1.07 mg/dL — ABNORMAL HIGH (ref 0.44–1.00)
GFR, Estimated: 59 mL/min — ABNORMAL LOW (ref >60)
Glucose, Bld: 102 mg/dL — ABNORMAL HIGH (ref 70–99)
Potassium: 3.8 mmol/L (ref 3.5–5.1)
Sodium: 138 mmol/L (ref 135–145)

## 2023-07-03 ENCOUNTER — Other Ambulatory Visit (HOSPITAL_COMMUNITY): Payer: Self-pay | Admitting: Cardiology

## 2023-08-13 ENCOUNTER — Ambulatory Visit (HOSPITAL_COMMUNITY): Payer: Medicare Other

## 2023-08-13 ENCOUNTER — Encounter (HOSPITAL_COMMUNITY): Payer: Medicare Other | Admitting: Cardiology

## 2023-09-25 ENCOUNTER — Encounter (HOSPITAL_COMMUNITY): Payer: Self-pay | Admitting: Cardiology

## 2023-09-25 ENCOUNTER — Ambulatory Visit (HOSPITAL_COMMUNITY)
Admission: RE | Admit: 2023-09-25 | Discharge: 2023-09-25 | Disposition: A | Discharge status: Home / Self Care | Payer: Medicare Other | Source: Ambulatory Visit | Attending: Internal Medicine | Admitting: Internal Medicine

## 2023-09-25 ENCOUNTER — Ambulatory Visit (HOSPITAL_BASED_OUTPATIENT_CLINIC_OR_DEPARTMENT_OTHER)
Admission: RE | Admit: 2023-09-25 | Discharge: 2023-09-25 | Disposition: A | Discharge status: Home / Self Care | Payer: Medicare Other | Source: Ambulatory Visit | Attending: Cardiology | Admitting: Cardiology

## 2023-09-25 VITALS — BP 128/70 | HR 70 | Wt 174.0 lb

## 2023-09-25 DIAGNOSIS — I5022 Chronic systolic (congestive) heart failure: Secondary | ICD-10-CM | POA: Insufficient documentation

## 2023-09-25 DIAGNOSIS — I110 Hypertensive heart disease with heart failure: Secondary | ICD-10-CM | POA: Insufficient documentation

## 2023-09-25 DIAGNOSIS — Z79899 Other long term (current) drug therapy: Secondary | ICD-10-CM | POA: Diagnosis not present

## 2023-09-25 DIAGNOSIS — R002 Palpitations: Secondary | ICD-10-CM | POA: Insufficient documentation

## 2023-09-25 DIAGNOSIS — I428 Other cardiomyopathies: Secondary | ICD-10-CM | POA: Insufficient documentation

## 2023-09-25 DIAGNOSIS — Z7984 Long term (current) use of oral hypoglycemic drugs: Secondary | ICD-10-CM | POA: Diagnosis not present

## 2023-09-25 LAB — BASIC METABOLIC PANEL
Anion gap: 8 (ref 5–15)
BUN: 12 mg/dL (ref 8–23)
CO2: 25 mmol/L (ref 22–32)
Calcium: 9.7 mg/dL (ref 8.9–10.3)
Chloride: 105 mmol/L (ref 98–111)
Creatinine, Ser: 0.94 mg/dL (ref 0.44–1.00)
GFR, Estimated: >60 mL/min (ref >60)
Glucose, Bld: 108 mg/dL — ABNORMAL HIGH (ref 70–99)
Potassium: 5.1 mmol/L (ref 3.5–5.1)
Sodium: 138 mmol/L (ref 135–145)

## 2023-09-25 LAB — ECHOCARDIOGRAM COMPLETE
AV Mean grad: 2 mm[Hg]
AV Peak grad: 3.3 mm[Hg]
Ao pk vel: 0.91 m/s
Area-P 1/2: 3.48 cm2
S' Lateral: 2.7 cm

## 2023-09-25 LAB — BRAIN NATRIURETIC PEPTIDE: B Natriuretic Peptide: 43.9 pg/mL (ref 0.0–100.0)

## 2023-09-25 NOTE — Progress Notes (Signed)
*  PRELIMINARY RESULTS* Echocardiogram 2D Echocardiogram has been performed.  Jordan [name] 09/25/2023, 10:44 AM

## 2023-09-25 NOTE — Progress Notes (Addendum)
ADVANCED HF CLINIC NOTE   ID:  Jordan [name], DOB 09-Aug-1961, MRN [id number]   Provider location: [facility] Type of Visit: Established patient   PCP:  Jarrett Soho, PA-C HF Cardiology: Dr. Shirlee Latch   History of Present Illness: Jordan [name] is a 62 y.o.female who initially developed dyspnea in 11/18. She had several rounds of antibiotics for presumed bronchitis.  She did not get better, and ended up hospitalized in 3/19 where she was found to have acute systolic CHF.  Echo in 3/19 showed EF 15% with moderate-severe MR.  She was diuresed, and RHC/LHC was done => no coronary disease, low but not markedly low cardiac output.  Since then, she has had another echo in 7/19 that showed EF 25-30% and mild MR.   - Cardiac MRI 3/20 showed LV EF 46%, RV EF 48%, no LGE.   - Echo in 3/21 showed EF up to 50% with mild LVH.  - Echo 3/22: EF 55-60%, GIDD, RV normal.  Echo was done today and reviewed, EF 55-60%, mild LVH, normal RV, normal IVC.  Today she returns for AHF follow up. Weight down 3 lbs.  She has been doing well, no exertional dyspnea or chest pain.  She takes care of her grandchildren.  No lightheadedness.  No orthopnea/PND.  No palpitations.   EKG NSR, low voltage (Personally reviewed)    Labs (8/19): pro-BNP 746, hgb 13.7, K 4.9, creatinine 1.01 Labs (9/19): ANA negative, myeloma panel negative, TSH normal, Fe studies normal.  Labs (10/19): K 4.3, creatinine 0.87 Labs (1/20): K 4.3, creatinine 0.88 Labs (3/20): K 4.2, creatinine 0.97 Labs (8/20): K 4.6, creatinine 1.06, TSH normal, hgb 14.1 Labs (2/21): K 4.2, creatinine 0.96 Labs (4/21): LDL 80, TGs 326 Labs (6/21): K 4.7, creatinine 1.28 Labs (5/22): K 4.7, SCr 1.13 Labs (5/24): K 3.8, creatinine 1.07, LDL 84  PMH:  1. Hyperlipidemia 2. Left TKR 3. HTN 4. Chronic systolic CHF: Nonischemic cardiomyopathy.  - Echo (3/19): EF 15%, mild LV dilation, mild LVH, moderate-severe mitral  regurgitation, PASP 34 mmHg.  - LHC/RHC (4/19): No CAD.  Mean RA 4, PA 31/14, mean PCWP 12, CI 2.1 Fick/2.1 Thermo.   - Echo (7/19): EF 25-30%, severe LV dilation, mild MR.  - CPX (2/20): RER 1.3, VE/VCO2 27, VO2 15.8 => no significant HF limitation, appears to be limited mainly due to deconditioning.  - Cardiac MRI (3/20): LV EF 46%, RV EF 48%, no LGE.  - Echo (3/21): EF 50%, mild LVH, normal RV - Echo (3/22): EF 55-60%, GIDD, RV normal. - Echo (10/24): EF 55-60%, mild LVH, normal RV, normal IVC 5. Palpitations: Holter (9/19) with occasional PVCs.   SH: Married, retired from working at [employer], nonsmoker, no ETOH, no drugs.   FH: Father with CABG in 79s.  No history of nonischemic cardiomyopathy.   ROS: All systems reviewed and negative except as per HPI.   Current Outpatient Medications  Medication Sig Dispense Refill   amLODipine (NORVASC) 5 MG tablet Take 1 tablet (5 mg total) by mouth daily. 90 tablet 3   amoxicillin (AMOXIL) 500 MG tablet Take 4 tablets 30-60 minutes before procedure 4 tablet 0   aspirin EC 81 MG tablet Take 81 mg by mouth daily. Swallow whole.     CALCIUM PO Take 600 mg by mouth daily.      carvedilol (COREG) 25 MG tablet Take 1 tablet (25 mg total) by mouth 2 (two) times daily. NEEDS APPOINTMENT FOR MORE  REFILLS 30 tablet 0   empagliflozin (JARDIANCE) 10 MG TABS tablet Take 1 tablet (10 mg total) by mouth daily before breakfast. 90 tablet 1   ipratropium (ATROVENT) 0.06 % nasal spray Place 2 sprays into both nostrils 2 (two) times daily as needed for rhinitis.   0   metFORMIN (GLUCOPHAGE) 500 MG tablet Take 500 mg by mouth at bedtime.      Multiple Vitamins-Minerals (MULTIVITAMIN WITH MINERALS) tablet Take 1 tablet by mouth daily.     oxyCODONE-acetaminophen (PERCOCET/ROXICET) 5-325 MG tablet Take 1 tablet by mouth every 4 (four) hours as needed for severe pain. 30 tablet 0   pravastatin (PRAVACHOL) 80 MG tablet Take 80 mg by mouth daily.      sacubitril-valsartan (ENTRESTO) 97-103 MG Take 1 tablet by mouth 2 (two) times daily. 60 tablet 5   spironolactone (ALDACTONE) 25 MG tablet TAKE 1 TABLET BY MOUTH DAILY. MUST HAVE OFFICE VISIT FOR ADDITIONAL REFILLS CALL [phone] 15 tablet 0   tiZANidine (ZANAFLEX) 2 MG tablet Take 1 tablet (2 mg total) by mouth every 6 (six) hours as needed. 60 tablet 0   Venlafaxine HCl 225 MG TB24 Take 225 mg by mouth daily.   1   No current facility-administered medications for this encounter.   BP 128/70   Pulse 70   Wt 78.9 kg (174 lb)   SpO2 97%   BMI 28.96 kg/m   General: NAD Neck: No JVD, no thyromegaly or thyroid nodule.  Lungs: Clear to auscultation bilaterally with normal respiratory effort. CV: Nondisplaced PMI.  Heart regular S1/S2, no S3/S4, no murmur.  No peripheral edema.  No carotid bruit.  Normal pedal pulses.  Abdomen: Soft, nontender, no hepatosplenomegaly, no distention.  Skin: Intact without lesions or rashes.  Neurologic: Alert and oriented x 3.  Psych: Normal affect. Extremities: No clubbing or cyanosis.  HEENT: Normal.   Wt Readings from Last 3 Encounters:  09/25/23 78.9 kg (174 lb)  05/02/23 80.3 kg (177 lb)  03/07/21 76.7 kg (169 lb)    Assessment/Plan: 1. Chronic systolic CHF: Nonischemic cardiomyopathy.  Last echo in 7/19 with some improvement, EF 25-30%, up from 15% in 3/19.  Cath in 3/19 with no coronary disease.  Symptoms began in 11/19, initially seemed like URI.  Possible viral myocarditis.  No history of ETOH or drugs.  No FH of nonischemic cardiomyopathy.  CPX in 2/20 suggested minimal HF limitation, primarily limited by deconditioning.  Cardiac MRI in 3/20 showed normal RV function, LV EF 46%, no delayed enhancement.  Echo 3/21 showed EF up to 50%. Echo today showed EF 55-60%.  NYHA class I, not volume overloaded on exam.  - Continue Coreg 25 mg bid.   - Continue Entresto 97/103 bid.   - Continue spironolactone 25 daily.  BMET/BNP today.  - Continue  Jardiance 10 mg daily - She does not need Lasix.   2. HTN: BP controlled.  3. Palpitations. Holter 9/19 with occasional PVCs. None felt recently  Followup in 1 year but she will need BMET every 3-4 months with current medication regimen.   Signed, Marca Ancona  09/25/2023

## 2023-09-25 NOTE — Patient Instructions (Signed)
There has been no changes to your medications.  Labs done today, your results will be available in MyChart, we will contact you for abnormal readings.  Your physician recommends that you schedule a follow-up appointment in: 1 year ( October 2025) ** PLEASE CALL THE OFFICE IN JULY 2025 TO ARRANGE YOUR FOLLOW UP APPOINTMENT. **  If you have any questions or concerns before your next appointment please send [place] a message through [facility] or call our office at [phone].    TO LEAVE A MESSAGE FOR THE NURSE SELECT OPTION 2, PLEASE LEAVE A MESSAGE INCLUDING: YOUR NAME DATE OF BIRTH CALL BACK NUMBER REASON FOR CALL**this is important as we prioritize the call backs  YOU WILL RECEIVE A CALL BACK THE SAME DAY AS LONG AS YOU CALL BEFORE 4:00 PM  At [facility], you and your health needs are our priority. As part of our continuing mission to provide you with exceptional heart care, we have created designated Provider Care Teams. These Care Teams include your primary Cardiologist (physician) and Advanced Practice Providers (APPs- Physician Assistants and Nurse Practitioners) who all work together to provide you with the care you need, when you need it.   You may see any of the following providers on your designated Care Team at your next follow up: Dr Arvilla Meres Dr Marca Ancona Dr. Dorthula Nettles Dr. Clearnce Hasten Amy Filbert Schilder, NP Robbie Lis, [place] [facility] [place], [place] Brynda Peon, NP [place] Lee, NP Karle Plumber, PharmD   Please be sure to bring in all your medications bottles to every appointment.    Thank you for choosing [facility]

## 2023-11-11 ENCOUNTER — Other Ambulatory Visit (HOSPITAL_COMMUNITY): Payer: Self-pay | Admitting: Pharmacist

## 2023-11-11 MED ORDER — EMPAGLIFLOZIN 10 MG PO TABS: 10.0000 mg | ORAL_TABLET | Freq: Every day | ORAL | 3 refills | Status: DC

## 2024-01-23 ENCOUNTER — Other Ambulatory Visit (HOSPITAL_COMMUNITY): Payer: Self-pay | Admitting: Cardiology

## 2024-02-24 ENCOUNTER — Other Ambulatory Visit (HOSPITAL_COMMUNITY): Payer: Self-pay | Admitting: Cardiology

## 2024-02-24 MED ORDER — CARVEDILOL 25 MG PO TABS: 25.0000 mg | ORAL_TABLET | Freq: Two times a day (BID) | ORAL | 8 refills | Status: DC

## 2024-08-20 ENCOUNTER — Other Ambulatory Visit (HOSPITAL_COMMUNITY): Payer: Self-pay | Admitting: Cardiology

## 2024-11-06 ENCOUNTER — Encounter: Payer: Self-pay | Admitting: Internal Medicine

## 2024-11-19 ENCOUNTER — Other Ambulatory Visit (HOSPITAL_COMMUNITY): Payer: Self-pay | Admitting: Cardiology

## 2024-12-24 ENCOUNTER — Other Ambulatory Visit (HOSPITAL_COMMUNITY): Payer: Self-pay | Admitting: Cardiology

## 2024-12-24 MED ORDER — AMLODIPINE BESYLATE 5 MG PO TABS: 5.0000 mg | ORAL_TABLET | Freq: Every day | ORAL | 3 refills | Status: AC

## 2024-12-24 MED ORDER — SPIRONOLACTONE 25 MG PO TABS: 25.0000 mg | ORAL_TABLET | Freq: Every day | ORAL | 3 refills | Status: AC

## 2024-12-24 MED ORDER — SACUBITRIL-VALSARTAN 97-103 MG PO TABS: 1.0000 | ORAL_TABLET | Freq: Two times a day (BID) | ORAL | 11 refills | Status: AC

## 2024-12-24 MED ORDER — CARVEDILOL 25 MG PO TABS: 25.0000 mg | ORAL_TABLET | Freq: Two times a day (BID) | ORAL | 3 refills | Status: AC

## 2024-12-24 MED ORDER — EMPAGLIFLOZIN 10 MG PO TABS: 10.0000 mg | ORAL_TABLET | Freq: Every day | ORAL | 3 refills | Status: AC

## 2024-12-30 ENCOUNTER — Other Ambulatory Visit (HOSPITAL_BASED_OUTPATIENT_CLINIC_OR_DEPARTMENT_OTHER): Payer: Self-pay | Admitting: Family Medicine

## 2024-12-30 DIAGNOSIS — E2839 Other primary ovarian failure: Secondary | ICD-10-CM

## 2025-02-16 ENCOUNTER — Ambulatory Visit (HOSPITAL_COMMUNITY): Admitting: Cardiology
